# Patient Record
Sex: Female | Born: 1945
Health system: Southern US, Community
[De-identification: ages and names within clinical notes are randomized; demographics above are authoritative.]

## PROBLEM LIST (undated history)

## (undated) DIAGNOSIS — F329 Major depressive disorder, single episode, unspecified: Secondary | ICD-10-CM

## (undated) DIAGNOSIS — F32A Depression, unspecified: Secondary | ICD-10-CM

## (undated) DIAGNOSIS — R300 Dysuria: Secondary | ICD-10-CM

## (undated) DIAGNOSIS — J45909 Unspecified asthma, uncomplicated: Secondary | ICD-10-CM

## (undated) DIAGNOSIS — R32 Unspecified urinary incontinence: Secondary | ICD-10-CM

## (undated) DIAGNOSIS — M25561 Pain in right knee: Secondary | ICD-10-CM

## (undated) DIAGNOSIS — M9711XA Periprosthetic fracture around internal prosthetic right knee joint, initial encounter: Secondary | ICD-10-CM

## (undated) DIAGNOSIS — Z5189 Encounter for other specified aftercare: Secondary | ICD-10-CM

## (undated) DIAGNOSIS — IMO0002 Reserved for concepts with insufficient information to code with codable children: Secondary | ICD-10-CM

## (undated) DIAGNOSIS — M797 Fibromyalgia: Secondary | ICD-10-CM

## (undated) DIAGNOSIS — M1990 Unspecified osteoarthritis, unspecified site: Secondary | ICD-10-CM

## (undated) DIAGNOSIS — R0602 Shortness of breath: Secondary | ICD-10-CM

## (undated) DIAGNOSIS — M25569 Pain in unspecified knee: Secondary | ICD-10-CM

## (undated) DIAGNOSIS — R600 Localized edema: Secondary | ICD-10-CM

## (undated) DIAGNOSIS — Z862 Personal history of diseases of the blood and blood-forming organs and certain disorders involving the immune mechanism: Secondary | ICD-10-CM

## (undated) DIAGNOSIS — E785 Hyperlipidemia, unspecified: Secondary | ICD-10-CM

## (undated) DIAGNOSIS — I471 Supraventricular tachycardia, unspecified: Secondary | ICD-10-CM

## (undated) DIAGNOSIS — I499 Cardiac arrhythmia, unspecified: Secondary | ICD-10-CM

## (undated) DIAGNOSIS — M7062 Trochanteric bursitis, left hip: Secondary | ICD-10-CM

## (undated) DIAGNOSIS — J309 Allergic rhinitis, unspecified: Secondary | ICD-10-CM

## (undated) DIAGNOSIS — Z96659 Presence of unspecified artificial knee joint: Secondary | ICD-10-CM

## (undated) DIAGNOSIS — E039 Hypothyroidism, unspecified: Secondary | ICD-10-CM

## (undated) DIAGNOSIS — K219 Gastro-esophageal reflux disease without esophagitis: Secondary | ICD-10-CM

## (undated) DIAGNOSIS — I10 Essential (primary) hypertension: Secondary | ICD-10-CM

## (undated) DIAGNOSIS — J069 Acute upper respiratory infection, unspecified: Secondary | ICD-10-CM

## (undated) DIAGNOSIS — K5900 Constipation, unspecified: Secondary | ICD-10-CM

## (undated) DIAGNOSIS — E669 Obesity, unspecified: Secondary | ICD-10-CM

## (undated) DIAGNOSIS — IMO0001 Reserved for inherently not codable concepts without codable children: Secondary | ICD-10-CM

## (undated) DIAGNOSIS — Z8679 Personal history of other diseases of the circulatory system: Secondary | ICD-10-CM

## (undated) DIAGNOSIS — Z8709 Personal history of other diseases of the respiratory system: Secondary | ICD-10-CM

## (undated) DIAGNOSIS — N390 Urinary tract infection, site not specified: Secondary | ICD-10-CM

## (undated) DIAGNOSIS — I5189 Other ill-defined heart diseases: Secondary | ICD-10-CM

## (undated) DIAGNOSIS — N329 Bladder disorder, unspecified: Secondary | ICD-10-CM

## (undated) HISTORY — DX: Bladder disorder, unspecified: N32.9

## (undated) HISTORY — DX: Essential (primary) hypertension: I10

## (undated) HISTORY — PX: ACHILLES TENDON REPAIR: SUR1153

## (undated) HISTORY — DX: Presence of unspecified artificial knee joint: Z96.659

## (undated) HISTORY — DX: Reserved for concepts with insufficient information to code with codable children: IMO0002

## (undated) HISTORY — DX: Pain in unspecified knee: M25.569

## (undated) HISTORY — DX: Shortness of breath: R06.02

## (undated) HISTORY — DX: Supraventricular tachycardia, unspecified: I47.10

## (undated) HISTORY — DX: Unspecified osteoarthritis, unspecified site: M19.90

## (undated) HISTORY — DX: Constipation, unspecified: K59.00

## (undated) HISTORY — DX: Pain in right knee: M25.561

## (undated) HISTORY — DX: Supraventricular tachycardia: I47.1

## (undated) HISTORY — DX: Localized edema: R60.0

## (undated) HISTORY — DX: Allergic rhinitis, unspecified: J30.9

## (undated) HISTORY — DX: Obesity, unspecified: E66.9

## (undated) HISTORY — DX: Dysuria: R30.0

## (undated) HISTORY — DX: Unspecified urinary incontinence: R32

## (undated) HISTORY — PX: PLANTAR FASCIA SURGERY: SHX746

## (undated) HISTORY — PX: FINGER SURGERY: SHX640

## (undated) HISTORY — DX: Other ill-defined heart diseases: I51.89

## (undated) HISTORY — PX: FOOT SURGERY: SHX648

## (undated) HISTORY — PX: ESOPHAGOGASTRODUODENOSCOPY: SHX1529

## (undated) HISTORY — PX: CARDIAC ELECTROPHYSIOLOGY STUDY AND ABLATION: SHX1294

## (undated) HISTORY — DX: Periprosthetic fracture around internal prosthetic right knee joint, initial encounter: M97.11XA

## (undated) HISTORY — PX: NECK SURGERY: SHX720

## (undated) HISTORY — DX: Unspecified asthma, uncomplicated: J45.909

## (undated) HISTORY — DX: Hypothyroidism, unspecified: E03.9

## (undated) HISTORY — DX: Trochanteric bursitis, left hip: M70.62

## (undated) HISTORY — DX: Hyperlipidemia, unspecified: E78.5

---
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
## 1975-11-26 HISTORY — PX: ABDOMINAL HYSTERECTOMY: SHX81

## 2002-01-29 ENCOUNTER — Encounter: Payer: Self-pay | Admitting: *Deleted

## 2002-01-29 ENCOUNTER — Ambulatory Visit (HOSPITAL_COMMUNITY): Admission: RE | Admit: 2002-01-29 | Discharge: 2002-01-29 | Payer: Self-pay | Admitting: *Deleted

## 2002-02-12 ENCOUNTER — Ambulatory Visit (HOSPITAL_COMMUNITY): Admission: RE | Admit: 2002-02-12 | Discharge: 2002-02-12 | Payer: Self-pay | Admitting: Neurosurgery

## 2002-02-12 ENCOUNTER — Encounter: Payer: Self-pay | Admitting: Neurosurgery

## 2005-11-11 ENCOUNTER — Inpatient Hospital Stay (HOSPITAL_COMMUNITY): Admission: RE | Admit: 2005-11-11 | Discharge: 2005-11-15 | Payer: Self-pay | Admitting: Orthopedic Surgery

## 2007-03-30 ENCOUNTER — Inpatient Hospital Stay (HOSPITAL_COMMUNITY): Admission: RE | Admit: 2007-03-30 | Discharge: 2007-04-02 | Payer: Self-pay | Admitting: Orthopedic Surgery

## 2007-04-10 ENCOUNTER — Encounter: Admission: RE | Admit: 2007-04-10 | Discharge: 2007-04-10 | Payer: Self-pay | Admitting: Orthopedic Surgery

## 2009-01-26 ENCOUNTER — Inpatient Hospital Stay (HOSPITAL_COMMUNITY): Admission: RE | Admit: 2009-01-26 | Discharge: 2009-01-29 | Payer: Self-pay | Admitting: Orthopedic Surgery

## 2009-10-12 ENCOUNTER — Inpatient Hospital Stay (HOSPITAL_COMMUNITY): Admission: RE | Admit: 2009-10-12 | Discharge: 2009-10-15 | Payer: Self-pay | Admitting: Orthopedic Surgery

## 2011-02-27 LAB — DIFFERENTIAL
Eosinophils Absolute: 0.2 10*3/uL (ref 0.0–0.7)
Lymphs Abs: 1.7 10*3/uL (ref 0.7–4.0)
Monocytes Absolute: 0.4 10*3/uL (ref 0.1–1.0)
Monocytes Relative: 8 % (ref 3–12)
Neutro Abs: 3.2 10*3/uL (ref 1.7–7.7)
Neutrophils Relative %: 59 % (ref 43–77)

## 2011-02-27 LAB — BASIC METABOLIC PANEL
CO2: 29 mEq/L (ref 19–32)
CO2: 29 mEq/L (ref 19–32)
Calcium: 7.8 mg/dL — ABNORMAL LOW (ref 8.4–10.5)
Calcium: 8.1 mg/dL — ABNORMAL LOW (ref 8.4–10.5)
Creatinine, Ser: 0.59 mg/dL (ref 0.4–1.2)
Creatinine, Ser: 0.74 mg/dL (ref 0.4–1.2)
GFR calc Af Amer: >60 mL/min (ref >60)
Glucose, Bld: 118 mg/dL — ABNORMAL HIGH (ref 70–99)
Sodium: 137 mEq/L (ref 135–145)

## 2011-02-27 LAB — CBC
Hemoglobin: 13 g/dL (ref 12.0–15.0)
Hemoglobin: 7.4 g/dL — ABNORMAL LOW (ref 12.0–15.0)
MCHC: 33.9 g/dL (ref 30.0–36.0)
MCHC: 34.4 g/dL (ref 30.0–36.0)
MCHC: 34.4 g/dL (ref 30.0–36.0)
MCV: 92.3 fL (ref 78.0–100.0)
Platelets: 100 10*3/uL — ABNORMAL LOW (ref 150–400)
Platelets: 193 10*3/uL (ref 150–400)
RBC: 2.42 MIL/uL — ABNORMAL LOW (ref 3.87–5.11)
RBC: 2.58 MIL/uL — ABNORMAL LOW (ref 3.87–5.11)
RDW: 12.2 % (ref 11.5–15.5)
RDW: 12.6 % (ref 11.5–15.5)
RDW: 13.2 % (ref 11.5–15.5)
WBC: 6.6 10*3/uL (ref 4.0–10.5)

## 2011-02-27 LAB — CROSSMATCH: Antibody Screen: NEGATIVE

## 2011-02-27 LAB — PROTIME-INR: INR: 0.96 (ref 0.00–1.49)

## 2011-02-27 LAB — COMPREHENSIVE METABOLIC PANEL
ALT: 22 U/L (ref 0–35)
Albumin: 3.7 g/dL (ref 3.5–5.2)
Calcium: 9.9 mg/dL (ref 8.4–10.5)
Glucose, Bld: 74 mg/dL (ref 70–99)
Sodium: 140 mEq/L (ref 135–145)
Total Protein: 6.8 g/dL (ref 6.0–8.3)

## 2011-02-27 LAB — URINALYSIS, ROUTINE W REFLEX MICROSCOPIC
Ketones, ur: NEGATIVE mg/dL
Nitrite: NEGATIVE
Protein, ur: NEGATIVE mg/dL
Urobilinogen, UA: 0.2 mg/dL (ref 0.0–1.0)

## 2011-02-27 LAB — APTT: aPTT: 27 seconds (ref 24–37)

## 2011-03-07 LAB — URINALYSIS, ROUTINE W REFLEX MICROSCOPIC
Bilirubin Urine: NEGATIVE
Ketones, ur: NEGATIVE mg/dL
Nitrite: NEGATIVE
Protein, ur: NEGATIVE mg/dL
Urobilinogen, UA: 0.2 mg/dL (ref 0.0–1.0)

## 2011-03-07 LAB — BASIC METABOLIC PANEL
BUN: 20 mg/dL (ref 6–23)
CO2: 26 mEq/L (ref 19–32)
CO2: 27 mEq/L (ref 19–32)
Calcium: 7.8 mg/dL — ABNORMAL LOW (ref 8.4–10.5)
Calcium: 7.9 mg/dL — ABNORMAL LOW (ref 8.4–10.5)
Chloride: 104 mEq/L (ref 96–112)
Creatinine, Ser: 1.21 mg/dL — ABNORMAL HIGH (ref 0.4–1.2)
Creatinine, Ser: 1.67 mg/dL — ABNORMAL HIGH (ref 0.4–1.2)
GFR calc Af Amer: 55 mL/min — ABNORMAL LOW (ref >60)
GFR calc Af Amer: >60 mL/min (ref >60)
GFR calc non Af Amer: 31 mL/min — ABNORMAL LOW (ref >60)
GFR calc non Af Amer: 45 mL/min — ABNORMAL LOW (ref >60)
Glucose, Bld: 116 mg/dL — ABNORMAL HIGH (ref 70–99)
Sodium: 134 mEq/L — ABNORMAL LOW (ref 135–145)

## 2011-03-07 LAB — CBC
HCT: 25.6 % — ABNORMAL LOW (ref 36.0–46.0)
HCT: 40.6 % (ref 36.0–46.0)
Hemoglobin: 13.9 g/dL (ref 12.0–15.0)
MCHC: 34.1 g/dL (ref 30.0–36.0)
MCV: 93.2 fL (ref 78.0–100.0)
Platelets: 138 10*3/uL — ABNORMAL LOW (ref 150–400)
RBC: 2.84 MIL/uL — ABNORMAL LOW (ref 3.87–5.11)
RBC: 2.99 MIL/uL — ABNORMAL LOW (ref 3.87–5.11)
RBC: 4.35 MIL/uL (ref 3.87–5.11)
RDW: 12.8 % (ref 11.5–15.5)
RDW: 13.3 % (ref 11.5–15.5)
WBC: 6.7 10*3/uL (ref 4.0–10.5)
WBC: 7.7 10*3/uL (ref 4.0–10.5)
WBC: 8.3 10*3/uL (ref 4.0–10.5)

## 2011-03-07 LAB — CROSSMATCH
ABO/RH(D): O NEG
ABO/RH(D): O NEG
Antibody Screen: NEGATIVE

## 2011-03-07 LAB — COMPREHENSIVE METABOLIC PANEL
Alkaline Phosphatase: 84 U/L (ref 39–117)
BUN: 14 mg/dL (ref 6–23)
CO2: 32 mEq/L (ref 19–32)
Chloride: 104 mEq/L (ref 96–112)
Creatinine, Ser: 0.68 mg/dL (ref 0.4–1.2)
GFR calc non Af Amer: >60 mL/min (ref >60)
Glucose, Bld: 79 mg/dL (ref 70–99)
Total Bilirubin: 0.9 mg/dL (ref 0.3–1.2)

## 2011-03-07 LAB — DIFFERENTIAL
Basophils Absolute: 0 10*3/uL (ref 0.0–0.1)
Basophils Relative: 0 % (ref 0–1)
Lymphocytes Relative: 24 % (ref 12–46)
Neutro Abs: 5.6 10*3/uL (ref 1.7–7.7)

## 2011-03-07 LAB — PROTIME-INR: Prothrombin Time: 12.8 seconds (ref 11.6–15.2)

## 2011-03-07 LAB — APTT: aPTT: 29 seconds (ref 24–37)

## 2011-04-09 NOTE — Op Note (Signed)
Peggy Jennings, Peggy Jennings              ACCOUNT NO.:  [id number]   MEDICAL RECORD NO.:  [id number]          PATIENT TYPE:  INP   LOCATION:  0002                         FACILITY:  [facility]   PHYSICIAN:  John L. Rendall, M.D.  DATE OF BIRTH:  [birth year]   DATE OF PROCEDURE:  01/26/2009  DATE OF DISCHARGE:                               OPERATIVE REPORT   PREOPERATIVE DIAGNOSES:  Osteoarthritis, left hip surgery and morbid  obesity.   SURGICAL PROCEDURES:  Left AML total hip replacement.   POSTOPERATIVE DIAGNOSIS:  Osteoarthritis, left hip surgery and morbid  obesity.   SURGEON:  John L. Rendall, M.D.   ASSISTANTAlisa Graff PAC   ANESTHESIA:  General.   PATHOLOGY:  The patient weighs 246 pounds and already has 2 total knees.  She has now got unending miserable pain with incapacitation from a worn  out left hip.  There is bone on bone on the regular films.   PROCEDURE:  Under general anesthesia the patient is placed in the right  lateral decubitus position and hip and the upper 2/3 of the leg are  prepared with DuraPrep and draped as a sterile field.  Posterior  approach is made through skin and 5 inches of subcutaneous tissue to the  IT band which is split in the line of its fibers.  A deep Charnley  retractor is inserted.  The hip was then internally rotated and the  short external rotators and hip capsule were taken down from bone with  the electrocautery.  Multiple small vessels were cauterized.  The hip  capsule was opened in a T-shaped manner, peeling the short external  rotators off of it with a long Cobb.  Once this was done, the hip was  dislocated and the superior femoral neck is exposed.  The IM initiator  and canal finder are used.  Progressive reaming up to 13 mm is done for  a 13.5 femoral component.  Femoral neck is then osteotomized about 1.5  cm above the lesser trochanter.  Rasping is then done using the 10.5, 12  and 13.5 narrow.  A calcar reamer was used on the 12.   Once this side is  completed, attention was turned to the acetabulum.  Two cobras were  placed inferiorly and 2 wing retractors superiorly in the interval  between the labrum and the capsule.  The labrum was then excised and the  ligamentum teres.  The hip was fully exposed and reaming then began with  45, 47, 49, 51, 52, 53 and the decision is made for a 54 acetabulum.  Trial seating of a 52 reveals a sort of asymmetrical rim fit and the  decision was made to go for the 300 series acetabular component.  The  Pinnacle acetabular cup size 54 was then press fit.  Trial seating with  the acetabular liner plus 4,10 degrees, 36 mm inside diameter was then  trialed and on the femoral side, a 13.5 narrow rasp with the standard  neck and +1.5 hip ball.  With this in place, the hip was stable through  full normal range of motion.  The apex hole eliminator and the Pinnacle  Marathon liner were then inserted, +4,10 degree angle, 36-mm inside and  54 mm outside diameter.  The acetabular side is then addressed in the  AML small stature 155 mm length fully pour coated with a 43 mm offset  was then used for a size 13.5, the metal femoral head 36 mm +1-1/2 neck  length is inserted with all these components in place, the hip was  reduced.  It is stable through full normal range of motion.  The hip  capsule was closed with #1 Tycron.  Short external rotator reattached  loosely with #1  Tycron, IT band closed with #1 Tycron, #1 Vicryl was then used in the  deep and more superficial layer and 2-0 Vicryl subcutaneous.  Skin  staples were used.  Blood loss estimate 350 mL.  The patient tolerated  the procedure well and returned to recovery in good condition.  Operative time approximately an hour and 25 minutes.      John L. Rendall, M.D.  Electronically Signed     JLR/MEDQ  D:  01/26/2009  T:  01/26/2009  Job:  [id number]

## 2011-04-09 NOTE — Op Note (Signed)
[name], Peggy Jennings NO.:  [id number]   MEDICAL RECORD NO.:  [id number]          PATIENT TYPE:  INP   LOCATION:  2899                         FACILITY:  [facility]   PHYSICIAN:  John L. Rendall, M.D.  DATE OF BIRTH:  [birth year]   DATE OF PROCEDURE:  03/30/2007  DATE OF DISCHARGE:                               OPERATIVE REPORT   PREOPERATIVE DIAGNOSIS:  Osteoarthritis right knee.   SURGICAL PROCEDURES:  Right LCS total knee replacement with computer  navigation assistance.   POSTOPERATIVE DIAGNOSIS:  Osteoarthritis right knee.   SURGEON:  John L. Rendall, M.D.   ASSISTANT:  Rexene Edison.   ANESTHESIA:  General.   PATHOLOGY:  The patient has bone on bone medially, right knee with  chronic pain despite all conservative measures and the arthroscopic  debridement survey years back.  She is eager for knee replacement due to  the chronic pain.   PROCEDURE:  Under general anesthesia, the right leg was prepared with  DuraPrep and draped as a sterile field.  Sterile tourniquet is put on  the proximal leg and legs wrapped out with the Esmarch tourniquet  elevated 350 mm.  A midline incision was made.  The patella was everted.  The knee is debrided for computer mapping.  Schanz pins were placed  through separate stab wounds in the superior medial tibia and superior  Schanz pins were placed in the distal medial femur through this surgical  incision.  The arrays are set up, the femoral head was identified medial  and lateral malleoli are identified in the proximal tibia and distal  femur are mapped.  Once this was completed, the proximal tibia is  resected using the first navigation guide.  This was done within 1  degree of anatomic accuracy.  Attention was then turned to the balancing  and ligaments were balanced within 1.2 degrees of anatomic accuracy.  Attention was then turned to the femur and the anterior and posterior  flare of the distal femur are resected with the  computer nav.  Distal  femoral cut was then made and cut superior balanced at approximately  12.5 mm.  The recessing guide is then used.  Remnants of the menisci and  cruciates and spurs off the back of the femoral condyle were debrided  proximal tibia was then exposed.  Center peg hole with keel is inserted  and trial 12.5 bearing is used and a standard plus femoral trial.  With  all components in place the knee has virtual anatomic alignment 0  degrees variation from anatomical axis and excellent stability of the  ligamentous structures to varus valgus and drawer testing.  Patella was  osteotomized, three peg holes placed with alignment, good with all trial  components.  The Schanz pins and arrays were removed.  Permanent  components were obtained.  A synovectomy is done while the cement is  being extra and the knee is washed out with pulse irrigation.  At this  point the permanent components  were cemented in place while cement hardens.  Minor debridement is  carried out.  Tourniquet is let down  at 1 hour 6 minutes.  Minor  bleeding is controlled with electrocautery.  The knee is then closed in  layers over medium Hemovac drain with #1 Tycron, #1-0 and #2-0 Vicryl  and skin clips.  The patient returned to recovery in good condition.      John L. [name], M.D.     Renato Gails  D:  03/30/2007  T:  03/30/2007  Job:  [id number]

## 2011-04-12 NOTE — Op Note (Signed)
[name], Jennings              ACCOUNT NO.:  [id number]   MEDICAL RECORD NO.:  [id number]          PATIENT TYPE:  INP   LOCATION:  1506                         [facility]ACILITY:  [facility]   PHYSICIAN:  John L. Rendall, M.D.  DATE OF BIRTH:  06/09/1946   DATE OF PROCEDURE:  11/11/2005  DATE OF DISCHARGE:                                 OPERATIVE REPORT   PREOPERATIVE DIAGNOSIS:  Osteoarthritis left knee.   SURGICAL PROCEDURES:  Left LCS total knee replacement with computer  navigation assistance.   POSTOPERATIVE DIAGNOSIS:  Osteoarthritis left knee.   SURGEON:  Dr. [name]   ASSISTANT:  Duffy, PAC   ANESTHESIA:  General with femoral nerve block.   PATHOLOGY:  The patient has a 5-degree varus knee with 4-5 degree extension  lag and bone against bone in the medial compartment with spurs.  She has had  unremitting pain resistant to conservative measures and a previous  arthroscopic debridement.   PROCEDURE:  Under general anesthesia, the left leg is prepared with DuraPrep  and draped as a sterile field.  It is wrapped out with an Esmarch, and a  sterile tourniquet is elevated at 350 mm.  Midline incision is made.  The  patella is everted.  The femur is sized to the standard.  The knee is  debrided in preparation for computer mapping.  Schanz pins are placed in the  proximal medial tibia through extra puncture wounds and due to the thickness  of the thigh which is right at 27 inches mid thigh, it is necessary to put  the femoral pins in through stab wounds as well.  At this point, computer  mapping is done, finding first the femoral head, then the ankle and then the  proximal tibia and distal femur.  Once this is completed and checked for  accuracy, proximal tibial resection is carried out within 1.5 degrees of  anatomical axis.  The tensioner is then used, and the leg is tensioned  within 1 degree of anatomical axis, and the flexion and extension gaps are  measured.  Subsequently,  balancing of the flexion and extension gaps is done  on computer to give the most stable knee with a standard femoral component.  Twelve-five bearing is estimated to be the best fit.  Using the first  femoral guide, the anterior and posterior flare of the distal femur are  resected.  Using the second guide, the distal femoral cut is made; flexion  and extension gaps are checked with the spacer block, and it is found that  there is some slight hyperextension.  The recessing guide is then used.  At  this point, the proximal tibia is exposed.  Debridement remnants of the  cruciates and menisci is done.  Spurs are taken off the back with femoral  condyles.  Tibia is sized at a #3.  Center peg hole with keel is inserted.  Trial reduction of the 12.5 bearing and standard femur reveals good flexion  and extension but slight hyperextension.  Patella is then osteotomized and 3  peg holes placed.  The knee is then retested using a  15-mm bearing.  Alignment is felt to be excellent within 1.5 degrees of anatomical axis, and  the flexion deformity preop is corrected at about zero.  Schanz pins are  then removed.  Bony surfaces are prepared with pulse irrigation.  Full  synovectomy is completed.  Permanent components are then cemented in place,  tibia first, rotating bearing, then femoral component.  It should be noted  the size of her thigh, 31 inches under the cuff, made it difficult applying  the femoral component, but an excellent fit was obtained.  The patellar  button was then attached.  Once cement hardened, the tourniquet was let down  at 1 hour and 30 minutes;  excess cement was removed.  Multiple small vessels were cauterized.  The  knee was closed in layers of #1 Tycron, #1 Vicryl, 2-0 Vicryl, and skin  clips.  Total operative time just under 2 hours.  The patient tolerated the  procedure well and returned to recovery in good condition.      John L. Rendall, M.D.  Electronically  Signed     JLR/MEDQ  D:  11/11/2005  T:  11/12/2005  Job:  [id number]

## 2011-04-12 NOTE — Discharge Summary (Signed)
Peggy Jennings, Peggy Jennings              ACCOUNT NO.:  [id number]   MEDICAL RECORD NO.:  [id number]          PATIENT TYPE:  INP   LOCATION:  1506                         FACILITY:  [facility]   PHYSICIAN:  Peggy Jennings, M.D.  DATE OF BIRTH:  [birth year]   DATE OF ADMISSION:  11/11/2005  DATE OF DISCHARGE:  11/15/2005                                 DISCHARGE SUMMARY   ADMISSION DIAGNOSES:  1.  End-stage osteoarthritis, left knee, medial compartment.  2.  Hypertension.  3.  Gastroesophageal reflux disease.  4.  Asthma.  5.  Fibromyalgia.  6.  Hyperglycemia.  7.  Depression.   DISCHARGE DIAGNOSES:  1.  Status post left total knee arthroplasty.  2.  Acute blood loss anemia secondary to surgery, requiring packed red blood      cell transfusion.  3.  Confusion secondary to pain medications.  4.  Pyrexia, resolved.  5.  Erythema about the left knee incision, placed on Keflex.   HISTORY OF PRESENT ILLNESS:  Peggy Jennings is a 65 year old white female with a  2-3 year history of gradual onset of progressively worsening left knee pain.  The patient has a history of right knee arthroscopy in 2004 and a left knee  arthroscopy in April, 2006 by Dr. Priscille Jennings.  The patient has had no other  injuries or surgeries to her knee.  The left knee pain is described as  intermittent, a dull-to-sharp sensation and is diffuse about the joint with  radiation proximally to the thigh and hip.  She has increased pain with any  standing or walking.  The pain is exacerbated by going up or down stairs.  The pain decreases with rest and only with changing positions.  The patient  does describe mechanical symptoms of catching.  No locking or giving way.  The knee feels unstable.  The patient has some balance issues with the knee.  She does have waking pain.  She uses no assistive devices to ambulate.  The  patient has failed conservative treatment, which included cortisone  injections, which provided minimal relief.  No  viscus supplementation.  The  patient takes Tramadol or Mobic for pain, and this provides some relief.   ALLERGIES:  No known drug allergies.   MEDICATIONS:  1.  Micardis/HCTZ 80/25 mg 1 daily in the a.m.  2.  Mobic 7.5 mg 1 tablet b.i.d.  3.  Vytorin 10/20 mg 1 tablet nightly.  4.  Diltiazem ER 180 mg 1 tab nightly.  5.  Cymbalta 60 mg 1 tab in the a.m.  6.  Altace 10 mg 1 tab b.i.d.  7.  Tramadol 1 tab b.i.d.  8.  Nexium 1 tab nightly.  9.  Aspirin 81 mg 1 tab q.a.m.  10. Albuterol inhaler 2 puffs inhaled q.4h. p.r.n.  11. Senior vitamin 1 tab p.o. q.a.m.  12. Super Omega 3 two tabs p.o. q.a.m.  13. Glucosamine chondroitin 2 tabs q.a.m.  14. Calcium 2 tablets p.o. q.a.m.  15. Essian-HS 1.25/0.625 1 q.p.m.   SURGICAL PROCEDURE:  The patient was taken to the operating room on November 11, 2005 by  Dr. Erasmo Jennings, assisted by Peggy Rancher, PA-C.  The patient  was placed under general anesthesia and received a supplemental femoral  nerve block.  The patient then underwent a left LCS total knee with computer  navigation.  The following components were used.  A standard femoral  component, size 3 tibia tray, a 15 mm bearing, and a standard three peg  metal-back patellar component.  All components were cemented.  The patient  tolerated the procedure well and returned to recovery in good stable  condition.   CONSULTS:  The following consults were obtained while the patient was  hospitalized:  PT/OT, case management.   HOSPITAL COURSE:  On postop day #1, the patient with no chest pain,  shortness of breath, calf pain.  Tolerating diet.  Pain under good control.  Patient afebrile.  Vital signs stable.   On postop day #2, the patient with some dizziness with standing.  No  shortness of breath.  No chest pain.  No nausea or vomiting.  Tolerating  diet well.  Pain under adequate control.  T max is 100.6.  Vital signs  otherwise stable.  H&H is 9.8 and 27.9.  White count was 10,300.   Dizziness  was felt to be secondary to pain medications.  PCA was DC'd.  The patient  was monitored.   On postop day #3, the patient denies chest pain, shortness of breath, nausea  or vomiting.  No dysuria.  Tolerating diet well.  Pain under adequate  control.  Confusion overnight, per patient.  The patient did report a  similar confusion as an outpatient with pain medicines in the past.  The  patient's T max is 102.  Blood pressure was 117/56.  Pulse 96.  O2  saturations 91% on room air.  H&H is 9.1 and 26.4.   Due to the patient's acute blood loss anemia secondary to surgery, decreased  O2 saturation on room air, and dizziness, she was transfused 1 units of  packed red blood cells.  With regards to the patient's confusion, dizziness,  it was felt that this was due to pain meds and/or acute blood loss anemia.  The patient's pain medicine was changed from Percocet to Vicodin.  The  patient with pyrexia.  Decreased O2 saturation; therefore, chest x-ray was  checked.  Also, blood culture.  The patient denied any dysuria.  Aggressive  pulmonary toilet is instituted.   On physical exam, patient with calf pain, positive Homans'.  Therefore,  Doppler of the left lower extremity was obtained to rule out DVT.   On postoperative day #4, the patient overall filling much better.  No chest  pain.  No shortness of breath.  No nausea or vomiting.  Dizziness resolved.  Positive bowel movement.  Pain under adequate control.  T max is 100.8.  Blood pressure was 138/68, pulse 91, respiratory rate 20.  O2 saturation 96%  on room air.  Lungs were clear to auscultation versus the day before where  there were crackles at the lower bases and expiratory wheezing.  Doppler was  negative for DVT.  Blood cultures pending.  Left calf is nontender.  There  was minimal serous drainage from the left knee incision with some mild erythema about the staples.  Therefore, the patient was placed on Keflex 500  mg 1 p.o.  q.i.d. x7 days for empiric treatment.  An H&H was performed, and  hemoglobin was 9.  Hematocrit was 26.  The patient was to be discharged  later  that day to home if she remained afebrile and continued to do well  with physical therapy.   LABS:  Routine labs on admission:  CBC dated November 06, 2005:  All values  within normal limits.  Coags on admission, November 06, 2005:  All values  within normal limits.  Routine chemistries on admission, November 06, 2005:  All values  within normal limits.  Hepatic enzymes on admission, all values  were within normal limits.  Urinalysis on admission was negative.  Blood  cultures x2 were negative for growth x5 days.   DISCHARGE INSTRUCTIONS:  1.  The patient was to resume preop meds except for Mobic, tramadol, and      aspirin.  The patient was to resume aspirin on November 18, 2005 after      finishing her extra dosing.  The following meds were to be added:      Arixtra 2.5 mg subcutaneously at 8 p.m., the last dose November 17, 2005, Keflex 500 mg 1 tab 4 times daily x7 days, Vicodin 5/500 1-2      tablets every 4-6 hours, Robaxin 500 mg 1 tablet every 6 hours as needed      for spasm.  Iron supplement OTC 1 tab daily x28 days.  2.  Diet:  No restrictions.  3.  Activity:  Patient's weightbearing as tolerated on the left leg.  4.  Wound care:  Patient is to keep the wound clean and dry, change the      dressing daily.  May shower after two days if no drainage.  The patient      is to call the office if any signs of infection, temperature greater      than 101.5, or the pain is not adequately controlled.  5.  Home health/PT, per Genevieve Norlander.  6.  Special instructions:  CPM 0-90 degrees 6-8 hours a day.  Increase by 10      degrees daily.  7.  Followup:  The patient needs followup with Dr. Priscille Jennings in the office in      approximately 10 days from discharge.  The patient is to call the office      at [phone] for appointment.      Richardean Canal, P.A.      Peggy Jennings, M.D.  Electronically Signed    GC/MEDQ  D:  12/25/2005  T:  12/25/2005  Job:  [id number]

## 2011-04-12 NOTE — Discharge Summary (Signed)
Peggy Jennings, Peggy Jennings              ACCOUNT NO.:  [id number]   MEDICAL RECORD NO.:  [id number]          PATIENT TYPE:  INP   LOCATION:  1604                         FACILITY:  [facility]   PHYSICIAN:  John L. Rendall, M.D.  DATE OF BIRTH:  [birth year]   DATE OF ADMISSION:  01/26/2009  DATE OF DISCHARGE:  01/29/2009                               DISCHARGE SUMMARY   ADMISSION DIAGNOSES:  1. End-stage osteoarthritis left hip status post bilateral knee      replacements.  2. Hypertension.  3. History of supraventricular tachycardia treated with ablation.  4. Hypercholesterolemia.  5. Depression.  6. Fibromyalgia.  7. Irritable bowel syndrome.  8. Asthma.  9. History of gastroesophageal reflux disease.   DISCHARGE DIAGNOSES:  1. End-stage osteoarthritis left hip status post left total hip      arthroplasty.  2. Acute blood loss anemia secondary to surgery.  3. Low urinary output, now improved.  4. History of bilateral total knees.  5. Hypertension.  6. History of supraventricular tachycardia treated with ablation.  7. Hypercholesterolemia.  8. Fibromyalgia.  9. Depression.  10.Irritable bowel syndrome.  11.Asthma.  12.History of gastroesophageal reflux disease.   SURGICAL PROCEDURES:  On January 26, 2009, Peggy Jennings underwent a left  total hip arthroplasty by Dr. Jonny Ruiz L.  Rendall assisted by Peggy Morale  PA-C.  She had a Pinnacle 300 series acetabular cup size 54 mm placed  with Apex hole eliminator and a Pinnacle marathon acetabular liner plus  410 degree 36 mm inner diameter and 54 mm outer diameter.  An AML small  stature size 13.5 femoral stem 155 mm length, 43 mm offset with a metal-  on-metal femoral head +1.5 neck length, size 36 with a 12/14 taper.   COMPLICATIONS:  None.   CONSULTS:  Physical therapy consult and occupational therapy consult  January 27, 2009.   HISTORY OF PRESENT ILLNESS:  A 65 year old white female patient  presented to Peggy Jennings with history of bilateral  total knees and 6  months of gradual onset of progressive left hip pain.  Pain is now  constant ache to burn and stab in left groin, thigh and buttock with  radiation into the proximal tibia.  It increases with weightbearing in  bad weather and decreases with sleep and rest.  The hip pops, catches,  grinds, keeps her up at night.  She cannot sleep on that left side.  She  has failed conservative treatment and x-rays show end-stage arthritic  changes of the hip.  Because of this, she is presenting for a left hip  replacement.   HOSPITAL COURSE:  Peggy Jennings tolerated her surgical procedure well  without immediate postoperative complications.  She was transferred to  the orthopedic floor.  On postop day 1, she was afebrile, BP was low at  97/62.  Hemoglobin was 8.7 with hematocrit of 25.6.  Leg was  neurovascularly intact.  Dressing intact with minimal drainage.  Because  of her low blood pressure, low urine output and low hemoglobin, she was  transfused with 2 units of packed red blood cells and some BP meds were  held.  I's and Os were monitored.  She was started on therapy per  protocol.   On postop day 2, she did still have some dizziness when out of bed.  Hemoglobin had improved to 9.5 with hematocrit of 27.9, BP was still low  at 97/56, but her leg remained neurovascularly intact.  She was  continued on therapy that day.   Postop day 3, she was feeling much better, less dizziness.  Hemoglobin  was 9, hematocrit 26, BP improved to 116/65.  Incision on the hip was  well approximated with staples.  No erythema or ecchymosis.  It was felt  she was ready for discharge home and was discharged home later that day.   DISCHARGE INSTRUCTIONS:  Diet:  She is to resume her regular  prehospitalization diet.   MEDICATIONS:  She may resume her home meds as follows:  1. Cymbalta 60 mg p.o. q.h.s.  2. Mobic 75 mg twice a day.  She is to hold at this time.  3. Simvastatin 40 mg p.o. q.h.s.  4.  Ramipril 10 mg p.o. b.i.d.  5. Premarin 0.45 mg p.o. q.a.m.  6. Diltiazem 240 mg p.o. q.h.s.  7. Micardis 80 mg p.o. q.a.m.  8. Aspirin, she is to hold at this time while on the Arixtra.  9. Tramadol, she is to hold at this time.  10.Robaxin 500 mg 1 p.o. q.i.d. p.r.n. spasm.  11.Fish oil 1000 mg p.o. b.i.d.  12.Chondroitin and glucosamine twice a day p.o.  13.Multivitamin 1 tablet p.o. q.a.m.   ADDITIONAL MEDICATIONS AT THIS TIME:  1. Arixtra 2.5 mg subcutaneous q.8 p.m. with the last dose on February 04, 2009.  2. On February 05, 2009, she is to resume daily aspirin.  3. Celebrex 200 mg 1 tablet p.o. b.i.d. for 1 week and then 1 tablet      p.o. day, 30 with no refill.  4. Norco 5/325 one-two tablets p.o. q.4 h. p.r.n. for pain, 60 with no      refill.  5. Robaxin 500 mg 1-2 tablets p.o. q.6 h. p.r.n. for spasms, 60 with      no refill.   ACTIVITY:  She can be out of bed weightbearing as tolerated on the left  leg with use of the walker.  No lifting or driving for 6 weeks.  Home  health PT per [facility].  Please see the blue total hip  discharge sheet for further activity instructions.   WOUND CARE:  She may shower after no drainage from the wound for 2 days.  Please see the blue total hip discharge sheet for further wound care  instructions.   FOLLOW UP:  She needs to follow up with Peggy Jennings in our office on  Tuesday, February 07, 2009 and needs to call [phone] for that appointment.   LABORATORY DATA:  Hemoglobin and hematocrit ranged from 13.9 and 40.6 on  January 23, 2009 to 8.7 at 25.6 on January 27, 2009 to 9 and 26 on January 29, 2009.  Platelets went from 197 on January 23, 2009 to 112 on January 28, 2009 to 124 on January 29, 2009.  Sodium ranged from 142 on January 23, 2009  to 131 on January 28, 2009 to 134 on January 29, 2009.  Glucose ranged from  79 on January 23, 2009 to 119 on January 29, 2009.  BUN and creatinine went  from 14 and 0.68 on January 23, 2009 to 26  and  1.21 on January 28, 2009 to  15 and 0.82 on January 29, 2009.  Estimated GFR was greater than 60 on  January 23, 2009 and went to 31 on January 27, 2009, 45 on January 28, 2009  and greater than 60 on January 29, 2009.  All other laboratory studies  were within normal limits.   DIAGNOSTICS:  Chest x-ray done on January 23, 2009 showed no acute  abnormalities.      Legrand Pitts Duffy, P.A.      John L. Rendall, M.D.  Electronically Signed    KED/MEDQ  D:  02/16/2009  T:  02/16/2009  Job:  [id number]

## 2011-04-12 NOTE — Discharge Summary (Signed)
Peggy Jennings, LUNT NO.:  [id number]   MEDICAL RECORD NO.:  [id number]          PATIENT TYPE:  INP   LOCATION:  5028                         FACILITY:  [facility]   PHYSICIAN:  John L. Rendall, M.D.  DATE OF BIRTH:  1946/01/10   DATE OF ADMISSION:  03/30/2007  DATE OF DISCHARGE:  04/02/2007                               DISCHARGE SUMMARY   ADMISSION DIAGNOSES:  1. End-stage osteoarthritis right knee.  2. Hypertension.  3. Asthma.  4. Fibromyalgia.   DISCHARGE DIAGNOSES:  1. End-stage osteoarthritis right knee status post right total knee      arthroplasty.  2. Acute blood loss anemia secondary to surgery.  3. Thrombocytopenia now stable.  4. Constipation now resolved.  5. Hypertension.  6. Asthma.  7. Fibromyalgia.   SURGICAL PROCEDURES:  On Mar 30, 2007 Peggy Jennings underwent a right total  knee arthroplasty with computer navigation by Dr. Jonny Ruiz L.  Rendall  assisted by Rexene Edison PA-C.  She had an LCS complete standard plus metal  back patella placed with an LCS complete RP insert size standard plus,  12.5 mm thickness.  A DePuy NBT keel tibial tray cemented size 2.5 and  LCS complete primary femoral component cemented size standard plus  right.   COMPLICATIONS:  None.   CONSULTS:  1. Physical therapy consult Mar 31, 2007.  2. Occupational therapy consult and case management consult Apr 01, 2007.   HISTORY OF PRESENT ILLNESS:  This 65 year old white female patient  presented to Dr. [name] with a history of a left knee replacement doing  well.  She has had persistent pain since that time in the right knee at  rest and with ADLs.  The pain is constant and severe.  She has failed  conservative treatment, and x-rays show end-stage arthritic changes of  the knee.  Because of this she is presenting for a right knee  replacement.   HOSPITAL COURSE:  Peggy Jennings tolerated her surgical procedure well  without immediate postoperative complications.  She was  transferred to  5000.  Postop day #1 she was afebrile, vitals stable.  Leg was  neurovascularly intact.  Hemoglobin 10, hematocrit 29.  Vitals stable.  She was started on therapy per protocol.   Postop day #2 T-max 98.8, vitals stable.  Hemoglobin 9.2, hematocrit  26.4, platelets low at 146.  She was switched to p.o. pain meds.  Foley  was DC'd.  Continued on therapy.  Constipation was treated with a  laxative.   On postop day #3 she is feeling better.  Hemoglobin 8.3 but she is  asymptomatic.  The hemoglobin was rechecked later that day, and it was  8.  She was subsequently transfused with 1 unit of packed red blood  cells and was able to be DC'd home afterwards.   DIET:  She can resume her regular prehospitalization diet.   MEDICATIONS:  She may resume her prehospitalization meds as noted.  1. Diltiazem 180 mg one tablet p.o. q.p.m.  2. Altace 10 mg one tablet p.o. b.i.d.  3. Cymbalta 60 mg one tablet  p.o. q.a.m.  4. Nexium 40 mg one tablet p.o. q.p.m.  5. __________ 0.625 mg one tablet p.o. q.p.m.  6. Micardis 80 mg one tablet p.o. q.a.m.  7. Vytorin 10/20 mg one tablet p.o. q.p.m.  8. Albuterol inhaler 2 puffs inhaled q.4 h. p.r.n.  9. She is not to resume her Mobic and tramadol at this time.   ADDITIONAL MEDICATIONS AT THIS POINT:  1. Arixtra 2.5 mg subcu q. 8 p.m.  Last dose Apr 05, 2007 and on the      12th she may resume one baby aspirin a day.  2. Norco 5/325 mg 1-2 tablets p.o. q.4 h. p.r.n. for pain, #60 with no      refill.  3. Robaxin 500 mg 1-2 tablets p.o. q.6 h. p.r.n. for spasms.  4. Iron supplement one tablet twice a day for one month.   ACTIVITY:  She can be out of bed weightbearing as tolerated on the right  leg with use of the walker.  She is to have home CPM 0-100 degrees 6-8  hours a day and home health PT per [place].  Please see the blue total  knee discharge sheet for further activity instructions.   WOUND CARE:  She may shower after no drainage from  the wound for two  days.  Please see the blue total knee discharge sheet for further wound  care instructions.   FOLLOWUP:  She is to follow up with Dr. [name] in our office on  Tuesday, Apr 14, 2007, and needs to call [phone] for that appointment.   LABORATORY DATA:  Hemoglobin/hematocrit ranged from 13.5 and 38.3 on the  2nd to a low of 8 and 22.7 on the 8th.  Platelets ranged from 193 on the  2nd to 146 on the 7th.   Sodium dropped to a low of 134 on the 7th.  Potassium to a high of 5.2  on the 7th, but then rebounded to 4.6 on the 8th.  Glucose ranged from  71 on the 2nd to a high of 108 on the 7th.  All other laboratory studies  were within normal limits.     Legrand Pitts Duffy, P.A.      John L. Rendall, M.D.  Electronically Signed   KED/MEDQ  D:  05/15/2007  T:  05/15/2007  Job:  [id number]

## 2011-04-12 NOTE — H&P (Signed)
Peggy Jennings, Peggy Jennings              ACCOUNT NO.:  [id number]   MEDICAL RECORD NO.:  [id number]          PATIENT TYPE:  INP   LOCATION:  NA                           FACILITY:  [facility]   PHYSICIAN:  John L. Rendall, M.D.  DATE OF BIRTH:  02/01/1946   DATE OF ADMISSION:  11/11/2005  DATE OF DISCHARGE:                                HISTORY & PHYSICAL   CHIEF COMPLAINT:  Left knee pain for the last 2-3 years.   HISTORY OF PRESENT ILLNESS:  This 65 year old white female patient presented  to Dr. [name] with a two to three-year history of gradual onset of  progressively worsening left knee pain. She has a history of a right knee  arthroscopy in 2004 and then a left knee arthroscopy in April 2006 by Dr.  [name]. She has had no other injury or any other surgery to her knee.   At this point, the left knee pain is described as an intermittent dull to  very sharp sensation that is diffuse about the joint with radiation  proximally into the thigh and hip. She has a lot of stiffness in the knee.  The pain increases with any standing or walking and then seems to be much  worse with going up or down stairs. Pain seems to decrease with rest and  position changes. She complains of the knee popping, catching, grinding, and  swelling at times. There is no locking or giving way. The knee does feel  unstable, and she has some balance issues with it, and the pain does keep  her up at night at times. She is not ambulating with any assistive devices.  She has received cortisone injection in the past with minimal relief. No  viscus supplementation. She is currently taking tramadol on Mobic for pain  control, and that provides some relief.   She has no known drug allergies.   CURRENT MEDICATIONS:  1.  Micardis HCT 80/25 mg 1 tablet p.o. q.a.m.  2.  Mobic 7.5 mg 1 tablet p.o. twice daily.  3.  X four in 10/20 mg 1 tablet p.o. nightly.  4.  Diltiazem HCl ER 180 mg 1 tablet p.o. nightly.  5.  Cymbalta 60  mg 1 tablet p.o. to a.m.  6.  Altace 10 mg 1 tablet p.o. twice daily.  7.  __________1.2 5/0.65 mg 1 tablet p.o. nightly.  8.  Tramadol 1 tablet p.o. twice daily.  9.  Nexium 40 mg 1 tablet p.o. nightly.  10. Aspirin 81 mg 1 tablet p.o. q.a.m.  11. Albuterol inhaler 2 puffs inhaled q.4 h p.r.n.Marland Kitchen  12. Senior vitamins 1 tablet p.o. q.a.m.Marland Kitchen  13. Super Omega-3 two tablets p.o. q.a.m.  14. Chondroitin and glucosamine 2 tablets p.o. q.a.m.  15. Calcium 2 tablets p.o. q.a.m.   PAST MEDICAL HISTORY:  1.  Hypertension diagnosed 5 years ago.  2.  Asthma diagnosed 6-7 years ago.  3.  Fibromyalgia diagnosed 10 years ago.  4.  Gastroesophageal reflux disease diagnosed 10 years ago.  5.  Hypercholesterolemia diagnosed 5-6 years ago.  6.  Depression diagnosed 6-7 years ago.   She  denies any history of thyroid disease, hiatal hernia, peptic ulcer  disease, heart disease, or any other chronic medical condition other than  noted previously.   PAST SURGICAL HISTORY:  1.  Appendectomy 1960 by [name].  2.  Bilateral tubal ligation 1972 by [name].  3.  Total abdominal hysterectomy in 1976 by [name].  4.  Release, right foot plantar fascia in 2002 by [name].  5.  Right knee arthroscopy 2004 by [name].  6.  Removal of cyst and osteophyte left index finger 2006 January 6 by Dr.      Bevely Palmer.  7.  Left knee arthroscopy in April 2006 by [name].  8.  Fusion of the left index finger PIP joint October 2006 by [name].   She denies any complications from the above-mentioned procedures.   SOCIAL HISTORY:  She denies any history of cigarette smoking, alcohol use or  drug use. She is married and has five children, one daughter and four sons.  She and her husband live in a one-story house with three steps into the main  entrance. She is a Location manager for [employer]. Her medical doctor  is [name] at [phone]. This is at [facility] in   [place] [address] [place], [place] [phone].   FAMILY HISTORY:  Mother died at age of 103 with coronary artery disease, a  stroke, and a heart attack. Father is alive at age 34 with arthritis and  coronary artery disease and history of silent MI. She has three brothers,  ages 31, 74, and 52, and they are all alive and well; and one half-brother  who 43 with a history of a heart attack. Her daughter is 75, and her four  other sons are younger to age 55. They are all alive and healthy.   REVIEW OF SYSTEMS:  She does wear glasses. She has a remote history of  dizziness and syncope due to her menses, none since she has gone through  menopause. She does have allergies. She complains of stiff neck and trigger  points due to fibromyalgia. She has a history of tachycardia with no real  cause found. It was evaluated at the stress test, and that was negative. She  does complain of some constipation at times. She has a living will, and her  power of attorney and Ramond Dial Allred.  All other systems were negative  and noncontributory.   PHYSICAL EXAMINATION:  GENERAL: Well-developed, well-nourished overweight  white female in no acute distress. Talks easily with examiner. Mood and  affect are appropriate. Walks without a limp.  Height 5 feet for a quarter inches weight 125 pounds, BMI is 37-1/2.  VITAL SIGNS: Temperature 97.9 degrees Fahrenheit, pulse 80, respirations 20,  and blood pressure 140/68.  HEENT: Normocephalic, atraumatic without frontal or maxillary sinus  tenderness to palpation. Conjunctiva pink. Sclerae anicteric. PERLA. EOMs  intact. No visible external ear deformities. Hearing grossly intact.  Tympanic membranes pearly gray bilaterally with good light reflex. Nose and  nasal septum midline. Nasal mucosa pink and moist without exudates or polyps noted. Buccal mucosa pink and moist. Dentition in good repair. Pharynx  without erythema or exudates. Tongue  and uvula midline. Tongue without  fasciculations, and uvula rises equally with phonation.  NECK: No visible masses or lesions noted. Trachea midline. No palpable  lymphadenopathy or thyromegaly. Carotids +2 bilaterally without bruits.  Range of motion of her neck is  a little bit decreased, but it seems to be  about even to each direction.  CARDIOVASCULAR: Heart rate and rhythm regular. S1-S2 present without rubs,  clicks or murmurs noted.  RESPIRATORY: Respirations even and unlabored. Breath sounds clear to  auscultation bilaterally without rales or wheezes noted.  ABDOMEN:  Rounded abdominal contour. Bowel sounds present x4 quadrants.  Soft, nontender to palpation without hepatosplenomegaly or CVA tenderness.  Femoral pulses +2 bilaterally. Nontender to palpation along the vertebral  column.  BREASTS/GU/RECTAL/PELVIC: These exams deferred at this time.  MUSCULOSKELETAL: She does have a splint and bandage on her left index  finger. There is no other deformity noted of her bilateral upper  extremities. She has full range of motion of these extremities without pain.  She has full range of motion or ankles and toes bilaterally. DP and PT  pulses are +2. Mild +1 to 2 pitting edema both lower extremities. No calf  pain with palpation and negative Homans' sign bilaterally.  She has full range of motion of her hips was full extension and flexion only  to about 100 degrees; her leg does hit her abdomen at that point. She has  good internal and external rotation without pain.  Right knee skin is intact. No erythema or ecchymosis. She has full extension  and flexion to 120 degrees without crepitus. She is tender to palpation on  both the medial and lateral joint line. There is a small effusion in the  knee, but she is stable to varus and valgus stress. Negative anterior  drawer. Left knee has well-healed arthroscopy portal sites. She is lacking  about 5-10 degrees of full extension and can flex  the knee to 110 degrees  with a moderate amount of crepitus. She is acutely tender to palpation over  both the medial and lateral joint line. Small effusion. Stable to varus and  valgus stress. Negative anterior drawer.  NEUROLOGIC:  Alert and oriented x3. Cranial nerves II-XII are grossly  intact. Strength 05/05 bilateral upper and lower extremities. Sensation  intact to light touch. Deep tendon reflexes 2+ bilateral upper and lower  extremities. Rapid alternating movements intact.   RADIOLOGIC FINDINGS:  X-rays taken of her left knee in October 2006 show  bone-on-bone contact in the medial compartment with cyst formation in the  medial femoral condyle and the medial tibial plateau at the margin.   IMPRESSION:  1.  End-stage osteoarthritis, left knee, medial compartment.  2.  Hypertension.  3.  Gastroesophageal reflux disease.  4.  Asthma.  5.  Fibromyalgia. 6.  Hypercholesterolemia.  7.  Depression.   PLAN:  Ms. Makarewicz will be admitted to [facility] on November 11, 2005, where she will undergo a left total knee arthroplasty by Dr. Jonny Ruiz L.  Rendall. She will undergo all the routine preoperative laboratory tests and  studies prior to this procedure. If she has any medical issues while she is  hospitalized, we will consult one the hospitalists.      Legrand Pitts Duffy, P.A.      John L. Rendall, M.D.  Electronically Signed    KED/MEDQ  D:  11/05/2005  T:  11/05/2005  Job:  [id number]   cc:   Abner Greenspan, M.D.  [place], [place]

## 2011-10-22 ENCOUNTER — Other Ambulatory Visit (HOSPITAL_COMMUNITY): Payer: Self-pay | Admitting: Orthopedic Surgery

## 2011-10-22 DIAGNOSIS — Z96659 Presence of unspecified artificial knee joint: Secondary | ICD-10-CM

## 2011-10-22 DIAGNOSIS — T84038A Mechanical loosening of other internal prosthetic joint, initial encounter: Secondary | ICD-10-CM

## 2011-10-28 ENCOUNTER — Encounter (HOSPITAL_COMMUNITY)
Admission: RE | Admit: 2011-10-28 | Discharge: 2011-10-28 | Disposition: A | Discharge status: Home / Self Care | Payer: Medicare Other | Source: Ambulatory Visit | Attending: Orthopedic Surgery | Admitting: Orthopedic Surgery

## 2011-10-28 ENCOUNTER — Encounter (HOSPITAL_COMMUNITY): Payer: Self-pay

## 2011-10-28 DIAGNOSIS — M25569 Pain in unspecified knee: Secondary | ICD-10-CM | POA: Insufficient documentation

## 2011-10-28 DIAGNOSIS — T84038A Mechanical loosening of other internal prosthetic joint, initial encounter: Secondary | ICD-10-CM

## 2011-10-28 DIAGNOSIS — Z96659 Presence of unspecified artificial knee joint: Secondary | ICD-10-CM | POA: Insufficient documentation

## 2011-10-28 MED ORDER — TECHNETIUM TC 99M MEDRONATE IV KIT
25.0000 | PACK | Freq: Once | INTRAVENOUS | Status: AC | PRN
  Administered 2011-10-28: 25 via INTRAVENOUS

## 2012-01-07 ENCOUNTER — Other Ambulatory Visit: Payer: Self-pay

## 2012-01-07 ENCOUNTER — Encounter (HOSPITAL_COMMUNITY): Payer: Self-pay

## 2012-01-07 ENCOUNTER — Encounter (HOSPITAL_COMMUNITY)
Admission: RE | Admit: 2012-01-07 | Discharge: 2012-01-07 | Disposition: A | Discharge status: Home / Self Care | Payer: Medicare Other | Source: Ambulatory Visit | Attending: Orthopedic Surgery | Admitting: Orthopedic Surgery

## 2012-01-07 ENCOUNTER — Other Ambulatory Visit: Payer: Self-pay | Admitting: Orthopedic Surgery

## 2012-01-07 ENCOUNTER — Encounter (HOSPITAL_COMMUNITY): Payer: Self-pay | Admitting: Pharmacy Technician

## 2012-01-07 HISTORY — DX: Personal history of other diseases of the respiratory system: Z87.09

## 2012-01-07 HISTORY — DX: Urinary tract infection, site not specified: N39.0

## 2012-01-07 HISTORY — DX: Personal history of diseases of the blood and blood-forming organs and certain disorders involving the immune mechanism: Z86.2

## 2012-01-07 HISTORY — DX: Personal history of other diseases of the circulatory system: Z86.79

## 2012-01-07 HISTORY — DX: Major depressive disorder, single episode, unspecified: F32.9

## 2012-01-07 HISTORY — DX: Acute upper respiratory infection, unspecified: J06.9

## 2012-01-07 HISTORY — DX: Shortness of breath: R06.02

## 2012-01-07 HISTORY — DX: Reserved for inherently not codable concepts without codable children: IMO0001

## 2012-01-07 HISTORY — DX: Encounter for other specified aftercare: Z51.89

## 2012-01-07 HISTORY — DX: Gastro-esophageal reflux disease without esophagitis: K21.9

## 2012-01-07 HISTORY — DX: Essential (primary) hypertension: I10

## 2012-01-07 HISTORY — DX: Fibromyalgia: M79.7

## 2012-01-07 HISTORY — DX: Cardiac arrhythmia, unspecified: I49.9

## 2012-01-07 HISTORY — DX: Hyperlipidemia, unspecified: E78.5

## 2012-01-07 HISTORY — DX: Depression, unspecified: F32.A

## 2012-01-07 LAB — COMPREHENSIVE METABOLIC PANEL
ALT: 23 U/L (ref 0–35)
Alkaline Phosphatase: 79 U/L (ref 39–117)
CO2: 31 mEq/L (ref 19–32)
GFR calc Af Amer: >90 mL/min (ref >90)
GFR calc non Af Amer: >90 mL/min (ref >90)
Glucose, Bld: 86 mg/dL (ref 70–99)
Potassium: 4.7 mEq/L (ref 3.5–5.1)
Sodium: 139 mEq/L (ref 135–145)

## 2012-01-07 LAB — URINALYSIS, ROUTINE W REFLEX MICROSCOPIC
Bilirubin Urine: NEGATIVE
Glucose, UA: NEGATIVE mg/dL
Hgb urine dipstick: NEGATIVE
Nitrite: NEGATIVE
Specific Gravity, Urine: 1.02 (ref 1.005–1.030)
pH: 5.5 (ref 5.0–8.0)

## 2012-01-07 LAB — SURGICAL PCR SCREEN: Staphylococcus aureus: NEGATIVE

## 2012-01-07 LAB — DIFFERENTIAL
Basophils Absolute: 0 10*3/uL (ref 0.0–0.1)
Basophils Relative: 0 % (ref 0–1)
Eosinophils Absolute: 0.3 10*3/uL (ref 0.0–0.7)
Eosinophils Relative: 4 % (ref 0–5)

## 2012-01-07 LAB — PROTIME-INR: Prothrombin Time: 12.2 seconds (ref 11.6–15.2)

## 2012-01-07 LAB — APTT: aPTT: 27 seconds (ref 24–37)

## 2012-01-07 LAB — CBC
HCT: 40.6 % (ref 36.0–46.0)
Hemoglobin: 13.9 g/dL (ref 12.0–15.0)
MCH: 31.3 pg (ref 26.0–34.0)
RBC: 4.44 MIL/uL (ref 3.87–5.11)

## 2012-01-07 MED ORDER — CHLORHEXIDINE GLUCONATE 4 % EX LIQD: 60.0000 mL | Freq: Once | CUTANEOUS | Status: DC

## 2012-01-07 NOTE — Pre-Procedure Instructions (Signed)
20 Peggy Jennings  01/07/2012   Your procedure is scheduled on:  Monday, January 20, 2012  Report to [facility] at 08:00 AM.  Call this number if you have problems the morning of surgery: [phone]   Remember:   Do not eat food:After Midnight.  May have clear liquids: up to 4 Hours before arrival. (04:00 AM)  Clear liquids include soda, tea, black coffee, apple or grape juice, broth.  Take these medicines the morning of surgery with A SIP OF WATER: Clonidine, Omeprazole, stop Meloxicam and Aspirin as instructed by surgeon, stop fish oil 5 days prior to surgery.    Do not wear jewelry, make-up or nail polish.  Do not wear lotions, powders, or perfumes. You may wear deodorant.  Do not shave 48 hours prior to surgery.  Do not bring valuables to the hospital.  Contacts, dentures or bridgework may not be worn into surgery.  Leave suitcase in the car. After surgery it may be brought to your room.  For patients admitted to the hospital, checkout time is 11:00 AM the day of discharge.   Patients discharged the day of surgery will not be allowed to drive home.  Name and phone number of your driver: N/A  Special Instructions: CHG Shower Use Special Wash: 1/2 bottle night before surgery and 1/2 bottle morning of surgery.   Please read over the following fact sheets that you were given: Pain Booklet, Coughing and Deep Breathing, Blood Transfusion Information, Total Joint Packet and Surgical Site Infection Prevention, CHG and Mupirocin Information Sheets

## 2012-01-07 NOTE — Progress Notes (Signed)
Called for orders in PAT at 1115 and 1300.

## 2012-01-07 NOTE — Progress Notes (Signed)
Patient has history of Artial Fibrillation. Had ablation performed by [facility]. Has not seen Cardiologist since around the ablation time (saw Dr. Dulce Sellar). [name] is primary care physician [facility] in [place]).

## 2012-01-07 NOTE — Progress Notes (Signed)
Patient instructed to stop Meloxicam on 2/22 and Aspirin 2/16 by surgeon. Instructed patient to stop Fish Oil 5 days prior to surgery. Patient usually takes majority of medicines at night. Instructed to take as scheduled.

## 2012-01-08 LAB — URINE CULTURE: Culture  Setup Time: 201302121714

## 2012-01-08 NOTE — Consult Note (Signed)
Anesthesia:  Patient is a 66 year old female scheduled for a right TK revision on 01/20/12.  History includes SVT s/p ablation, GERD, fibromyalgia, depression, HTN, HLD, asthma, anemia with history of a blood transfusion, GERD, non-smoker.  Labs and CXR noted.  EKG shows NSR.  I called to clarify when she had last been seen by Cardiologist Dr. Dulce Sellar.  She saw him over 5 years ago when she was having symptomatic palpitations.  Those symptoms resolved s/p ablation, and she has not required any recent follow-up.  She has tolerated bilateral TKA and THR since then.  She denies CP.  She does get occasional SOB that is associated with asthma exacerbations for which she uses inhalers.  She feels her breathing is at her baseline and is without acute symptoms at present.  Plan to proceed if no significant change in her cardiopulmonary status.

## 2012-01-09 ENCOUNTER — Other Ambulatory Visit (HOSPITAL_COMMUNITY): Payer: Medicare Other

## 2012-01-18 ENCOUNTER — Other Ambulatory Visit: Payer: Self-pay | Admitting: Orthopedic Surgery

## 2012-01-18 NOTE — H&P (Signed)
Peggy Jennings MRN:  [id number] DOB/SEX:  [birth year]/female  CHIEF COMPLAINT:  Painful right Knee  HISTORY: Patient is a 66 y.o. female presented with a history of pain in the right knee. Onset of symptoms was gradual starting several years ago with gradually worsening course since that time. The patient noted no past surgery on the right knee. Prior procedures on the knee include arthroplasty. Patient has been treated conservatively with over-the-counter NSAIDs and activity modification. Patient currently rates pain in the knee at 10 out of 10 with activity. There is pain at night.  PAST MEDICAL HISTORY: There are no active problems to display for this patient.  Past Medical History  Diagnosis Date  . Fibromyalgia   . History of atrial fibrillation     ablation performed in 2007.  Marland Kitchen Hypertension   . Dysrhythmia     Hx of atrial fib. See progress note.   . Hyperlipemia   . Shortness of breath   . Asthma   . GERD (gastroesophageal reflux disease)   . Recurrent upper respiratory infection (URI)   . History of bronchitis   . Urinary tract infection 2 months ago   . Depression   . Blood transfusion   . History of anemia    Past Surgical History  Procedure Date  . Appendectomy 1960  . Abdominal hysterectomy 1977  . Joint replacement 2-3 years    Right Hip  . Joint replacement 3-4 years ago    Left Hip  . Joint replacement 4 years ago     Right Knee  . Joint replacement 10 years ago     Left Knee  . Plantar fascia surgery 7-8 Years     Right Foot  . Finger surgery     left index finger, right thumb, cyst removal     MEDICATIONS:   (Not in a hospital admission)  ALLERGIES:   Allergies  Allergen Reactions  . Percocet (Oxycodone-Acetaminophen) Other (See Comments)    hallucinations    REVIEW OF SYSTEMS:  Pertinent items are noted in HPI.   FAMILY HISTORY:   Family History  Problem Relation Age of Onset  . Anesthesia problems Neg Hx   . Hypotension Neg Hx     . Malignant hyperthermia Neg Hx   . Pseudochol deficiency Neg Hx     SOCIAL HISTORY:   History  Substance Use Topics  . Smoking status: Never Smoker   . Smokeless tobacco: Never Used  . Alcohol Use: No     EXAMINATION:  Vital signs in last 24 hours: @VSRANGES @  General appearance: alert, cooperative and no distress Lungs: clear to auscultation bilaterally Heart: regular rate and rhythm, S1, S2 normal, no murmur, click, rub or gallop Abdomen: soft, non-tender; bowel sounds normal; no masses,  no organomegaly Extremities: extremities normal, atraumatic, no cyanosis or edema and Homans sign is negative, no sign of DVT Pulses: 2+ and symmetric Skin: Skin color, texture, turgor normal. No rashes or lesions Neurologic: Alert and oriented X 3, normal strength and tone. Normal symmetric reflexes. Normal coordination and gait  Musculoskeletal:  ROM 0-100, Ligaments intact,  Imaging Review xrays show loose components  Assessment/Plan: painful right knee      The clearance notes were reviewed.  After discussion with the patient it was felt that Total Knee Revision was indicated. The procedure,  risks, and benefits of total knee arthroplasty were presented and reviewed. The risks including but not limited to aseptic loosening, infection, blood clots, vascular injury, stiffness, patella tracking problems  complications among others were discussed. The patient acknowledged the explanation, agreed to proceed with the plan.  Mansel Strother 01/18/2012, 3:20 PM

## 2012-01-19 MED ORDER — CEFAZOLIN SODIUM-DEXTROSE 2-3 GM-% IV SOLR
2.0000 g | INTRAVENOUS | Status: AC
  Administered 2012-01-20: 2 g via INTRAVENOUS
  Filled 2012-01-19: qty 50

## 2012-01-20 ENCOUNTER — Encounter (HOSPITAL_COMMUNITY): Payer: Self-pay | Admitting: Vascular Surgery

## 2012-01-20 ENCOUNTER — Encounter (HOSPITAL_COMMUNITY): Payer: Self-pay | Admitting: *Deleted

## 2012-01-20 ENCOUNTER — Encounter (HOSPITAL_COMMUNITY)
Admission: RE | Disposition: A | Discharge status: Skilled Nursing Facility | Payer: Self-pay | Source: Ambulatory Visit | Attending: Orthopedic Surgery

## 2012-01-20 ENCOUNTER — Inpatient Hospital Stay (HOSPITAL_COMMUNITY)
Admission: RE | Admit: 2012-01-20 | Discharge: 2012-01-24 | DRG: 467 | Disposition: A | Discharge status: Skilled Nursing Facility | Payer: Medicare Other | Source: Ambulatory Visit | Attending: Orthopedic Surgery | Admitting: Orthopedic Surgery

## 2012-01-20 ENCOUNTER — Ambulatory Visit (HOSPITAL_COMMUNITY): Payer: Medicare Other | Admitting: Vascular Surgery

## 2012-01-20 DIAGNOSIS — IMO0001 Reserved for inherently not codable concepts without codable children: Secondary | ICD-10-CM | POA: Diagnosis present

## 2012-01-20 DIAGNOSIS — T84012A Broken internal right knee prosthesis, initial encounter: Secondary | ICD-10-CM

## 2012-01-20 DIAGNOSIS — T84019A Broken internal joint prosthesis, unspecified site, initial encounter: Principal | ICD-10-CM | POA: Diagnosis present

## 2012-01-20 DIAGNOSIS — I4891 Unspecified atrial fibrillation: Secondary | ICD-10-CM | POA: Diagnosis present

## 2012-01-20 DIAGNOSIS — Z96649 Presence of unspecified artificial hip joint: Secondary | ICD-10-CM

## 2012-01-20 DIAGNOSIS — D62 Acute posthemorrhagic anemia: Secondary | ICD-10-CM | POA: Diagnosis not present

## 2012-01-20 DIAGNOSIS — K219 Gastro-esophageal reflux disease without esophagitis: Secondary | ICD-10-CM | POA: Diagnosis present

## 2012-01-20 DIAGNOSIS — Z87440 Personal history of urinary (tract) infections: Secondary | ICD-10-CM

## 2012-01-20 DIAGNOSIS — Z886 Allergy status to analgesic agent status: Secondary | ICD-10-CM

## 2012-01-20 DIAGNOSIS — Z96659 Presence of unspecified artificial knee joint: Secondary | ICD-10-CM

## 2012-01-20 DIAGNOSIS — Z90710 Acquired absence of both cervix and uterus: Secondary | ICD-10-CM

## 2012-01-20 HISTORY — PX: TOTAL KNEE REVISION: SHX996

## 2012-01-20 HISTORY — PX: JOINT REPLACEMENT: SHX530

## 2012-01-20 SURGERY — TOTAL KNEE REVISION
Anesthesia: Regional | Site: Knee | Laterality: Right | Wound class: Clean

## 2012-01-20 MED ORDER — ALUM & MAG HYDROXIDE-SIMETH 200-200-20 MG/5ML PO SUSP: 30.0000 mL | ORAL | Status: DC | PRN

## 2012-01-20 MED ORDER — METHOCARBAMOL 100 MG/ML IJ SOLN
500.0000 mg | Freq: Four times a day (QID) | INTRAVENOUS | Status: DC | PRN
  Administered 2012-01-20: 500 mg via INTRAVENOUS
  Filled 2012-01-20: qty 5

## 2012-01-20 MED ORDER — CEFAZOLIN SODIUM-DEXTROSE 2-3 GM-% IV SOLR
2.0000 g | Freq: Four times a day (QID) | INTRAVENOUS | Status: AC
  Administered 2012-01-20 – 2012-01-22 (×6): 2 g via INTRAVENOUS
  Filled 2012-01-20 (×7): qty 50

## 2012-01-20 MED ORDER — HYDROMORPHONE HCL PF 1 MG/ML IJ SOLN
INTRAMUSCULAR | Status: AC
  Filled 2012-01-20: qty 1

## 2012-01-20 MED ORDER — ENOXAPARIN SODIUM 40 MG/0.4ML ~~LOC~~ SOLN
40.0000 mg | SUBCUTANEOUS | Status: DC
  Administered 2012-01-21 – 2012-01-24 (×4): 40 mg via SUBCUTANEOUS
  Filled 2012-01-20 (×6): qty 0.4

## 2012-01-20 MED ORDER — ACETAMINOPHEN 10 MG/ML IV SOLN
INTRAVENOUS | Status: AC
  Filled 2012-01-20: qty 100

## 2012-01-20 MED ORDER — ACETAMINOPHEN 325 MG PO TABS
650.0000 mg | ORAL_TABLET | Freq: Four times a day (QID) | ORAL | Status: DC | PRN
  Administered 2012-01-23: 650 mg via ORAL
  Filled 2012-01-20: qty 2

## 2012-01-20 MED ORDER — ONDANSETRON HCL 4 MG/2ML IJ SOLN: 4.0000 mg | Freq: Once | INTRAMUSCULAR | Status: DC | PRN

## 2012-01-20 MED ORDER — FLEET ENEMA 7-19 GM/118ML RE ENEM: 1.0000 | ENEMA | Freq: Once | RECTAL | Status: AC | PRN

## 2012-01-20 MED ORDER — LIDOCAINE HCL (CARDIAC) 20 MG/ML IV SOLN
INTRAVENOUS | Status: DC | PRN
  Administered 2012-01-20: 30 mg via INTRAVENOUS

## 2012-01-20 MED ORDER — FAMOTIDINE 20 MG PO TABS
20.0000 mg | ORAL_TABLET | Freq: Every day | ORAL | Status: DC
  Administered 2012-01-21 – 2012-01-24 (×4): 20 mg via ORAL
  Filled 2012-01-20 (×6): qty 1

## 2012-01-20 MED ORDER — BUPIVACAINE-EPINEPHRINE 0.25% -1:200000 IJ SOLN
INTRAMUSCULAR | Status: DC | PRN
  Administered 2012-01-20: 20 mL

## 2012-01-20 MED ORDER — OXYCODONE HCL 10 MG PO TB12
10.0000 mg | ORAL_TABLET | Freq: Two times a day (BID) | ORAL | Status: DC
  Administered 2012-01-20 – 2012-01-24 (×9): 10 mg via ORAL
  Filled 2012-01-20 (×9): qty 1

## 2012-01-20 MED ORDER — SIMVASTATIN 40 MG PO TABS
40.0000 mg | ORAL_TABLET | Freq: Every day | ORAL | Status: DC
  Administered 2012-01-20: 40 mg via ORAL
  Filled 2012-01-20 (×2): qty 1

## 2012-01-20 MED ORDER — DILTIAZEM HCL ER 240 MG PO CP24
240.0000 mg | ORAL_CAPSULE | Freq: Every day | ORAL | Status: DC
  Administered 2012-01-20 – 2012-01-24 (×5): 240 mg via ORAL
  Filled 2012-01-20 (×5): qty 1

## 2012-01-20 MED ORDER — DIPHENHYDRAMINE HCL 12.5 MG/5ML PO ELIX
12.5000 mg | ORAL_SOLUTION | ORAL | Status: DC | PRN
Start: 2012-01-20 — End: 2012-01-24
  Administered 2012-01-21 – 2012-01-22 (×3): 25 mg via ORAL
  Filled 2012-01-20 (×3): qty 5

## 2012-01-20 MED ORDER — MENTHOL 3 MG MT LOZG: 1.0000 | LOZENGE | OROMUCOSAL | Status: DC | PRN

## 2012-01-20 MED ORDER — METOCLOPRAMIDE HCL 10 MG PO TABS: 5.0000 mg | ORAL_TABLET | Freq: Three times a day (TID) | ORAL | Status: DC | PRN

## 2012-01-20 MED ORDER — ONDANSETRON HCL 4 MG PO TABS: 4.0000 mg | ORAL_TABLET | Freq: Four times a day (QID) | ORAL | Status: DC | PRN

## 2012-01-20 MED ORDER — ACETAMINOPHEN 650 MG RE SUPP: 650.0000 mg | Freq: Four times a day (QID) | RECTAL | Status: DC | PRN

## 2012-01-20 MED ORDER — FENTANYL CITRATE 0.05 MG/ML IJ SOLN
INTRAMUSCULAR | Status: DC | PRN
  Administered 2012-01-20 (×5): 25 ug via INTRAVENOUS
  Administered 2012-01-20 (×2): 50 ug via INTRAVENOUS
  Administered 2012-01-20 (×2): 25 ug via INTRAVENOUS
  Administered 2012-01-20: 50 ug via INTRAVENOUS
  Administered 2012-01-20 (×2): 100 ug via INTRAVENOUS
  Administered 2012-01-20 (×2): 25 ug via INTRAVENOUS
  Administered 2012-01-20: 50 ug via INTRAVENOUS
  Administered 2012-01-20: 25 ug via INTRAVENOUS

## 2012-01-20 MED ORDER — ACETAMINOPHEN 10 MG/ML IV SOLN
1000.0000 mg | Freq: Four times a day (QID) | INTRAVENOUS | Status: DC
  Administered 2012-01-20 (×2): 1000 mg via INTRAVENOUS

## 2012-01-20 MED ORDER — PHENYLEPHRINE HCL 10 MG/ML IJ SOLN
INTRAMUSCULAR | Status: DC | PRN
  Administered 2012-01-20 (×2): 80 ug via INTRAVENOUS
  Administered 2012-01-20: 40 ug via INTRAVENOUS

## 2012-01-20 MED ORDER — DULOXETINE HCL 60 MG PO CPEP
60.0000 mg | ORAL_CAPSULE | Freq: Every day | ORAL | Status: DC
  Administered 2012-01-21 – 2012-01-23 (×4): 60 mg via ORAL
  Filled 2012-01-20 (×7): qty 1

## 2012-01-20 MED ORDER — RAMIPRIL 10 MG PO TABS
10.0000 mg | ORAL_TABLET | Freq: Two times a day (BID) | ORAL | Status: DC
  Administered 2012-01-20 – 2012-01-24 (×6): 10 mg via ORAL
  Filled 2012-01-20 (×11): qty 1

## 2012-01-20 MED ORDER — MELOXICAM 7.5 MG PO TABS
7.5000 mg | ORAL_TABLET | Freq: Two times a day (BID) | ORAL | Status: DC
  Administered 2012-01-20 – 2012-01-24 (×8): 7.5 mg via ORAL
  Filled 2012-01-20 (×11): qty 1

## 2012-01-20 MED ORDER — CLONIDINE HCL 0.1 MG PO TABS
0.1000 mg | ORAL_TABLET | Freq: Two times a day (BID) | ORAL | Status: DC
  Administered 2012-01-20 – 2012-01-24 (×8): 0.1 mg via ORAL
  Filled 2012-01-20 (×11): qty 1

## 2012-01-20 MED ORDER — SENNOSIDES-DOCUSATE SODIUM 8.6-50 MG PO TABS: 1.0000 | ORAL_TABLET | Freq: Every evening | ORAL | Status: DC | PRN

## 2012-01-20 MED ORDER — HYDROMORPHONE HCL PF 1 MG/ML IJ SOLN
0.5000 mg | INTRAMUSCULAR | Status: DC | PRN
  Administered 2012-01-20 – 2012-01-21 (×3): 1 mg via INTRAVENOUS
  Filled 2012-01-20 (×4): qty 1

## 2012-01-20 MED ORDER — IRBESARTAN 300 MG PO TABS
300.0000 mg | ORAL_TABLET | Freq: Every day | ORAL | Status: DC
  Administered 2012-01-21 – 2012-01-24 (×4): 300 mg via ORAL
  Filled 2012-01-20 (×7): qty 1

## 2012-01-20 MED ORDER — BISACODYL 5 MG PO TBEC: 5.0000 mg | DELAYED_RELEASE_TABLET | Freq: Every day | ORAL | Status: DC | PRN

## 2012-01-20 MED ORDER — ONDANSETRON HCL 4 MG/2ML IJ SOLN
INTRAMUSCULAR | Status: DC | PRN
  Administered 2012-01-20 (×2): 4 mg via INTRAVENOUS

## 2012-01-20 MED ORDER — HYDROCODONE-ACETAMINOPHEN 7.5-325 MG PO TABS: 1.0000 | ORAL_TABLET | ORAL | Status: DC | PRN

## 2012-01-20 MED ORDER — MEPERIDINE HCL 25 MG/ML IJ SOLN: 6.2500 mg | INTRAMUSCULAR | Status: DC | PRN

## 2012-01-20 MED ORDER — METHOCARBAMOL 500 MG PO TABS
500.0000 mg | ORAL_TABLET | Freq: Four times a day (QID) | ORAL | Status: DC | PRN
  Administered 2012-01-21 – 2012-01-23 (×7): 500 mg via ORAL
  Filled 2012-01-20 (×7): qty 1

## 2012-01-20 MED ORDER — LACTATED RINGERS IV SOLN
INTRAVENOUS | Status: DC | PRN
Start: 2012-01-20 — End: 2012-01-20
  Administered 2012-01-20 (×4): via INTRAVENOUS

## 2012-01-20 MED ORDER — ZOLPIDEM TARTRATE 5 MG PO TABS
5.0000 mg | ORAL_TABLET | Freq: Every evening | ORAL | Status: DC | PRN
  Administered 2012-01-21: 5 mg via ORAL
  Filled 2012-01-20: qty 1

## 2012-01-20 MED ORDER — METOCLOPRAMIDE HCL 5 MG/ML IJ SOLN: 5.0000 mg | Freq: Three times a day (TID) | INTRAMUSCULAR | Status: DC | PRN

## 2012-01-20 MED ORDER — BUPIVACAINE 0.25 % ON-Q PUMP SINGLE CATH 300ML
300.0000 mL | INJECTION | Status: AC
  Administered 2012-01-20: 300 mL
  Filled 2012-01-20: qty 300

## 2012-01-20 MED ORDER — PANTOPRAZOLE SODIUM 40 MG PO TBEC
40.0000 mg | DELAYED_RELEASE_TABLET | Freq: Every day | ORAL | Status: DC
  Administered 2012-01-21 – 2012-01-24 (×4): 40 mg via ORAL
  Filled 2012-01-20 (×5): qty 1

## 2012-01-20 MED ORDER — ESTROGENS CONJUGATED 0.45 MG PO TABS
0.4500 mg | ORAL_TABLET | Freq: Every day | ORAL | Status: DC
  Administered 2012-01-21 – 2012-01-23 (×4): 0.45 mg via ORAL
  Filled 2012-01-20 (×6): qty 1

## 2012-01-20 MED ORDER — PROPOFOL 10 MG/ML IV EMUL
INTRAVENOUS | Status: DC | PRN
  Administered 2012-01-20: 20 mg via INTRAVENOUS
  Administered 2012-01-20: 200 mg via INTRAVENOUS
  Administered 2012-01-20: 20 mg via INTRAVENOUS

## 2012-01-20 MED ORDER — DOCUSATE SODIUM 100 MG PO CAPS
100.0000 mg | ORAL_CAPSULE | Freq: Two times a day (BID) | ORAL | Status: DC
  Administered 2012-01-20 – 2012-01-24 (×8): 100 mg via ORAL
  Filled 2012-01-20 (×11): qty 1

## 2012-01-20 MED ORDER — MILNACIPRAN HCL 50 MG PO TABS
50.0000 mg | ORAL_TABLET | Freq: Two times a day (BID) | ORAL | Status: DC
  Administered 2012-01-21 – 2012-01-24 (×7): 50 mg via ORAL
  Filled 2012-01-20 (×9): qty 1

## 2012-01-20 MED ORDER — PHENOL 1.4 % MT LIQD: 1.0000 | OROMUCOSAL | Status: DC | PRN

## 2012-01-20 MED ORDER — SODIUM CHLORIDE 0.9 % IR SOLN
Status: DC | PRN
  Administered 2012-01-20 (×2): 3000 mL

## 2012-01-20 MED ORDER — SODIUM CHLORIDE 0.9 % IV SOLN: INTRAVENOUS | Status: DC

## 2012-01-20 MED ORDER — ONDANSETRON HCL 4 MG/2ML IJ SOLN: 4.0000 mg | Freq: Four times a day (QID) | INTRAMUSCULAR | Status: DC | PRN

## 2012-01-20 MED ORDER — OMEPRAZOLE MAGNESIUM 20 MG PO TBEC: 20.0000 mg | DELAYED_RELEASE_TABLET | Freq: Every day | ORAL | Status: DC

## 2012-01-20 MED ORDER — MORPHINE SULFATE 2 MG/ML IJ SOLN: 0.0500 mg/kg | INTRAMUSCULAR | Status: DC | PRN

## 2012-01-20 MED ORDER — HYDROMORPHONE HCL PF 1 MG/ML IJ SOLN
0.2500 mg | INTRAMUSCULAR | Status: DC | PRN
  Administered 2012-01-20 (×4): 0.5 mg via INTRAVENOUS

## 2012-01-20 MED ORDER — METHOCARBAMOL 100 MG/ML IJ SOLN
500.0000 mg | INTRAVENOUS | Status: AC
  Administered 2012-01-20 (×2): 500 mg via INTRAVENOUS
  Filled 2012-01-20: qty 5

## 2012-01-20 MED ORDER — BUPIVACAINE ON-Q PAIN PUMP (FOR ORDER SET NO CHG)
INJECTION | Status: AC
  Filled 2012-01-20: qty 1

## 2012-01-20 SURGICAL SUPPLY — 82 items
Articular surface Zimmer ×2 IMPLANT
BANDAGE ESMARK 6X9 LF (GAUZE/BANDAGES/DRESSINGS) ×1 IMPLANT
BLADE SAGITTAL (BLADE)
BLADE SAGITTAL 13X1.27X60 (BLADE) ×2 IMPLANT
BLADE SAW SGTL 13X75X1.27 (BLADE) ×2 IMPLANT
BLADE SAW SGTL 83.5X18.5 (BLADE) ×2 IMPLANT
BLADE SAW THK.89X75X18XSGTL (BLADE) IMPLANT
BLADE SURG 10 STRL SS (BLADE) ×8 IMPLANT
BNDG ESMARK 6X9 LF (GAUZE/BANDAGES/DRESSINGS) ×2
BONE CHIP PRESERV 20CC (Bone Implant) ×4 IMPLANT
BOWL SMART MIX CTS (DISPOSABLE) ×2 IMPLANT
CATH KIT ON Q 7.5IN SLV (PAIN MANAGEMENT) ×2 IMPLANT
CLOTH BEACON ORANGE TIMEOUT ST (SAFETY) ×2 IMPLANT
COVER SURGICAL LIGHT HANDLE (MISCELLANEOUS) ×2 IMPLANT
CUFF TOURNIQUET SINGLE 34IN LL (TOURNIQUET CUFF) IMPLANT
CUFF TOURNIQUET SINGLE 44IN (TOURNIQUET CUFF) ×2 IMPLANT
DISTAL AUG ZIMMER (Orthopedic Implant) ×4 IMPLANT
DRAPE EXTREMITY T 121X128X90 (DRAPE) ×2 IMPLANT
DRAPE INCISE IOBAN 66X45 STRL (DRAPES) ×4 IMPLANT
DRAPE ORTHO SPLIT 77X108 STRL (DRAPES) ×1
DRAPE PROXIMA HALF (DRAPES) ×2 IMPLANT
DRAPE SURG ORHT 6 SPLT 77X108 (DRAPES) ×1 IMPLANT
DRAPE U-SHAPE 47X51 STRL (DRAPES) ×2 IMPLANT
DRSG ADAPTIC 3X8 NADH LF (GAUZE/BANDAGES/DRESSINGS) ×2 IMPLANT
DRSG PAD ABDOMINAL 8X10 ST (GAUZE/BANDAGES/DRESSINGS) ×2 IMPLANT
DURAPREP 26ML APPLICATOR (WOUND CARE) ×4 IMPLANT
ELECT REM PT RETURN 9FT ADLT (ELECTROSURGICAL) ×2
ELECTRODE REM PT RTRN 9FT ADLT (ELECTROSURGICAL) ×1 IMPLANT
EVACUATOR 1/8 PVC DRAIN (DRAIN) ×2 IMPLANT
FACESHIELD LNG OPTICON STERILE (SAFETY) ×2 IMPLANT
FEMORAL COMP ZIMMER (Orthopedic Implant) ×2 IMPLANT
GAUZE SPONGE 4X4 12PLY STRL LF (GAUZE/BANDAGES/DRESSINGS) ×2 IMPLANT
GLOVE BIOGEL PI IND STRL 6.5 (GLOVE) ×3 IMPLANT
GLOVE BIOGEL PI IND STRL 7.5 (GLOVE) ×1 IMPLANT
GLOVE BIOGEL PI IND STRL 8.5 (GLOVE) ×2 IMPLANT
GLOVE BIOGEL PI INDICATOR 6.5 (GLOVE) ×3
GLOVE BIOGEL PI INDICATOR 7.5 (GLOVE) ×1
GLOVE BIOGEL PI INDICATOR 8.5 (GLOVE) ×2
GLOVE SURG ORTHO 8.0 STRL STRW (GLOVE) ×8 IMPLANT
GLOVE SURG SS PI 6.5 STRL IVOR (GLOVE) ×4 IMPLANT
GOWN PREVENTION PLUS XLARGE (GOWN DISPOSABLE) ×8 IMPLANT
GOWN STRL NON-REIN LRG LVL3 (GOWN DISPOSABLE) IMPLANT
HANDPIECE INTERPULSE COAX TIP (DISPOSABLE) ×1
HOOD PEEL AWAY FACE SHEILD DIS (HOOD) ×8 IMPLANT
KIT BASIN OR (CUSTOM PROCEDURE TRAY) ×2 IMPLANT
KIT ROOM TURNOVER OR (KITS) ×2 IMPLANT
MANIFOLD NEPTUNE II (INSTRUMENTS) ×2 IMPLANT
NEEDLE 18GX1X1/2 (RX/OR ONLY) (NEEDLE) ×2 IMPLANT
NS IRRIG 1000ML POUR BTL (IV SOLUTION) ×2 IMPLANT
PACK TOTAL JOINT (CUSTOM PROCEDURE TRAY) ×2 IMPLANT
PAD ARMBOARD 7.5X6 YLW CONV (MISCELLANEOUS) ×4 IMPLANT
PADDING CAST COTTON 6X4 STRL (CAST SUPPLIES) ×2 IMPLANT
PADDING WEBRIL 6 STERILE (GAUZE/BANDAGES/DRESSINGS) ×2 IMPLANT
PATELLA ZIMMER 32MM (Orthopedic Implant) ×2 IMPLANT
POSITIONER HEAD PRONE TRACH (MISCELLANEOUS) ×2 IMPLANT
Palacose cement with Gentamicin 40.8 gram ×2 IMPLANT
Palacose cement with gentamicic 40.8 grams ×2 IMPLANT
Palacose cement with gentamicin 40.8  gra,m ×4 IMPLANT
SET HNDPC FAN SPRY TIP SCT (DISPOSABLE) ×1 IMPLANT
SPONGE GAUZE 4X4 12PLY (GAUZE/BANDAGES/DRESSINGS) ×2 IMPLANT
STAPLER VISISTAT 35W (STAPLE) ×2 IMPLANT
STEM ST EXT ZIER 100X145X12X (Stem) ×1 IMPLANT
STEM ST EXT ZIER 100X145X16X (Stem) ×1 IMPLANT
STEM ST EXT ZIMMER (Stem) ×2 IMPLANT
SUCTION FRAZIER TIP 10 FR DISP (SUCTIONS) IMPLANT
SUT BONE WAX W31G (SUTURE) ×2 IMPLANT
SUT PDS AB 0 CT 36 (SUTURE) IMPLANT
SUT PDS AB 1 CT  36 (SUTURE)
SUT PDS AB 1 CT 36 (SUTURE) IMPLANT
SUT PDS AB 2-0 CT1 27 (SUTURE) IMPLANT
SUT VIC AB 0 CTB1 27 (SUTURE) ×6 IMPLANT
SUT VIC AB 1 CT1 27 (SUTURE) ×3
SUT VIC AB 1 CT1 27XBRD ANBCTR (SUTURE) ×3 IMPLANT
SUT VIC AB 2-0 CTB1 (SUTURE) ×6 IMPLANT
SYR 20ML ECCENTRIC (SYRINGE) ×2 IMPLANT
TIBIA COMP ZIMMER (Orthopedic Implant) ×2 IMPLANT
TOWEL OR 17X24 6PK STRL BLUE (TOWEL DISPOSABLE) ×2 IMPLANT
TOWEL OR 17X26 10 PK STRL BLUE (TOWEL DISPOSABLE) ×2 IMPLANT
TRAY FOLEY CATH 14FR (SET/KITS/TRAYS/PACK) ×2 IMPLANT
TUBE ANAEROBIC SPECIMEN COL (MISCELLANEOUS) IMPLANT
WATER STERILE IRR 1000ML POUR (IV SOLUTION) ×2 IMPLANT
YANKAUER SUCT BULB TIP NO VENT (SUCTIONS) ×2 IMPLANT

## 2012-01-20 NOTE — Progress Notes (Signed)
Pt has had 350 cc bloody hemovac output in 50 min.  Pa Peggy Jennings notified.  hemovac clamped at 1400 as ord.

## 2012-01-20 NOTE — Transfer of Care (Signed)
Immediate Anesthesia Transfer of Care Note  Patient: Peggy Jennings  Procedure(s) Performed: Procedure(s) (LRB): TOTAL KNEE REVISION (Right)  Patient Location: PACU  Anesthesia Type: General  Level of Consciousness: sedated  Airway & Oxygen Therapy: Patient Spontanous Breathing and Patient connected to face mask oxygen  Post-op Assessment: Report given to PACU RN and Post -op Vital signs reviewed and stable  Post vital signs: Reviewed and stable  Complications: No apparent anesthesia complications

## 2012-01-20 NOTE — Interval H&P Note (Signed)
History and Physical Interval Note:  01/20/2012 7:13 AM  Peggy Jennings  has presented today for surgery, with the diagnosis of loosening tibil tray, failed total right knee 996.43  The various methods of treatment have been discussed with the patient and family. After consideration of risks, benefits and other options for treatment, the patient has consented to  Procedure(s) (LRB): TOTAL KNEE REVISION (Right) as a surgical intervention .  The patients' history has been reviewed, patient examined, no change in status, stable for surgery.  I have reviewed the patients' chart and labs.  Questions were answered to the patient's satisfaction.     Fama Muenchow,STEPHEN D

## 2012-01-20 NOTE — Plan of Care (Signed)
Problem: Consults Goal: Diagnosis- Total Joint Replacement Outcome: Completed/Met Date Met:  01/20/12 Revision Total Knee Right

## 2012-01-20 NOTE — Anesthesia Preprocedure Evaluation (Signed)
Anesthesia Evaluation  Patient identified by MRN, date of birth, ID band Patient awake    Reviewed: Allergy & Precautions, H&P , NPO status , Patient's Chart, lab work & pertinent test results  Airway Mallampati: II TM Distance: <3 FB Neck ROM: full    Dental   Pulmonary asthma ,          Cardiovascular hypertension, Pt. on medications + dysrhythmias Supra Ventricular Tachycardia  History of SVT s/p ablation   Neuro/Psych Depression  Neuromuscular disease    GI/Hepatic GERD-  Medicated and Controlled,  Endo/Other  Morbid obesity  Renal/GU      Musculoskeletal  (+) Fibromyalgia -  Abdominal   Peds  Hematology   Anesthesia Other Findings   Reproductive/Obstetrics                           Anesthesia Physical Anesthesia Plan  ASA: II  Anesthesia Plan: General   Post-op Pain Management:    Induction:   Airway Management Planned: Oral ETT  Additional Equipment:   Intra-op Plan:   Post-operative Plan:   Informed Consent:   Dental advisory given  Plan Discussed with: Anesthesiologist  Anesthesia Plan Comments:         Anesthesia Quick Evaluation

## 2012-01-20 NOTE — H&P (View-Only) (Signed)
Peggy Jennings MRN:  [id number] DOB/SEX:  01/13/1946/female  CHIEF COMPLAINT:  Painful right Knee  HISTORY: Patient is a 65 y.o. female presented with a history of pain in the right knee. Onset of symptoms was gradual starting several years ago with gradually worsening course since that time. The patient noted no past surgery on the right knee. Prior procedures on the knee include arthroplasty. Patient has been treated conservatively with over-the-counter NSAIDs and activity modification. Patient currently rates pain in the knee at 10 out of 10 with activity. There is pain at night.  PAST MEDICAL HISTORY: There are no active problems to display for this patient.  Past Medical History  Diagnosis Date  . Fibromyalgia   . History of atrial fibrillation     ablation performed in 2007.  . Hypertension   . Dysrhythmia     Hx of atrial fib. See progress note.   . Hyperlipemia   . Shortness of breath   . Asthma   . GERD (gastroesophageal reflux disease)   . Recurrent upper respiratory infection (URI)   . History of bronchitis   . Urinary tract infection 2 months ago   . Depression   . Blood transfusion   . History of anemia    Past Surgical History  Procedure Date  . Appendectomy 1960  . Abdominal hysterectomy 1977  . Joint replacement 2-3 years    Right Hip  . Joint replacement 3-4 years ago    Left Hip  . Joint replacement 4 years ago     Right Knee  . Joint replacement 10 years ago     Left Knee  . Plantar fascia surgery 7-8 Years     Right Foot  . Finger surgery     left index finger, right thumb, cyst removal     MEDICATIONS:   (Not in a hospital admission)  ALLERGIES:   Allergies  Allergen Reactions  . Percocet (Oxycodone-Acetaminophen) Other (See Comments)    hallucinations    REVIEW OF SYSTEMS:  Pertinent items are noted in HPI.   FAMILY HISTORY:   Family History  Problem Relation Age of Onset  . Anesthesia problems Neg Hx   . Hypotension Neg Hx     . Malignant hyperthermia Neg Hx   . Pseudochol deficiency Neg Hx     SOCIAL HISTORY:   History  Substance Use Topics  . Smoking status: Never Smoker   . Smokeless tobacco: Never Used  . Alcohol Use: No     EXAMINATION:  Vital signs in last 24 hours: @VSRANGES@  General appearance: alert, cooperative and no distress Lungs: clear to auscultation bilaterally Heart: regular rate and rhythm, S1, S2 normal, no murmur, click, rub or gallop Abdomen: soft, non-tender; bowel sounds normal; no masses,  no organomegaly Extremities: extremities normal, atraumatic, no cyanosis or edema and Homans sign is negative, no sign of DVT Pulses: 2+ and symmetric Skin: Skin color, texture, turgor normal. No rashes or lesions Neurologic: Alert and oriented X 3, normal strength and tone. Normal symmetric reflexes. Normal coordination and gait  Musculoskeletal:  ROM 0-100, Ligaments intact,  Imaging Review xrays show loose components  Assessment/Plan: painful right knee      The clearance notes were reviewed.  After discussion with the patient it was felt that Total Knee Revision was indicated. The procedure,  risks, and benefits of total knee arthroplasty were presented and reviewed. The risks including but not limited to aseptic loosening, infection, blood clots, vascular injury, stiffness, patella tracking problems   complications among others were discussed. The patient acknowledged the explanation, agreed to proceed with the plan.  Peggy Jennings 01/18/2012, 3:20 PM   

## 2012-01-20 NOTE — Progress Notes (Signed)
Orthopedic Tech Progress Note Patient Details:  Peggy Jennings 06/28/1946 [id number]  CPM Right Knee CPM Right Knee: On Right Knee Flexion (Degrees): 90  Right Knee Extension (Degrees): 0  [employer] at [employer] 01/20/2012, 1:19 PM

## 2012-01-20 NOTE — Op Note (Signed)
Dictation number [id number]

## 2012-01-20 NOTE — Anesthesia Procedure Notes (Signed)
Procedure Name: LMA Insertion Date/Time: 01/20/2012 9:04 AM Performed by: Malachi Pro Pre-anesthesia Checklist: Patient identified, Emergency Drugs available, Suction available, Patient being monitored and Timeout performed Patient Re-evaluated:Patient Re-evaluated prior to inductionOxygen Delivery Method: Circle system utilized Preoxygenation: Pre-oxygenation with 100% oxygen Intubation Type: IV induction LMA: LMA inserted LMA Size: 4.0 Number of attempts: 1 Tube secured with: Tape Dental Injury: Teeth and Oropharynx as per pre-operative assessment

## 2012-01-20 NOTE — Anesthesia Postprocedure Evaluation (Signed)
  Anesthesia Post-op Note  Patient: Peggy Jennings  Procedure(s) Performed: Procedure(s) (LRB): TOTAL KNEE REVISION (Right)  Patient Location: PACU  Anesthesia Type: GA combined with regional for post-op pain  Level of Consciousness: awake, alert  and oriented  Airway and Oxygen Therapy: Patient Spontanous Breathing and Patient connected to nasal cannula oxygen  Post-op Pain: intense  Post-op Assessment: Post-op Vital signs reviewed, Patient's Cardiovascular Status Stable, Respiratory Function Stable, Patent Airway, No signs of Nausea or vomiting and Pain level controlled  Post-op Vital Signs: Reviewed and stable  Complications: No apparent anesthesia complications

## 2012-01-20 NOTE — Progress Notes (Signed)
Upon repeat inspection of pt's dressing to R knee, there was significant bloody drainage which had saturated dressing. TED hose and web drsg was removed. ABDx2 and kerlex were applied with new TED covering. Pt's leg was cleaned and bed sheets were changed.  Peggy Jennings

## 2012-01-20 NOTE — Preoperative (Signed)
Beta Blockers   Reason not to administer Beta Blockers:Not Applicable 

## 2012-01-21 ENCOUNTER — Inpatient Hospital Stay (HOSPITAL_COMMUNITY): Payer: Medicare Other

## 2012-01-21 LAB — CBC
HCT: 27.5 % — ABNORMAL LOW (ref 36.0–46.0)
Hemoglobin: 9.4 g/dL — ABNORMAL LOW (ref 12.0–15.0)
MCH: 31.2 pg (ref 26.0–34.0)
MCHC: 34.2 g/dL (ref 30.0–36.0)
MCV: 91.4 fL (ref 78.0–100.0)
Platelets: 163 K/uL (ref 150–400)
RBC: 3.01 MIL/uL — ABNORMAL LOW (ref 3.87–5.11)
RDW: 12.7 % (ref 11.5–15.5)
WBC: 11.8 K/uL — ABNORMAL HIGH (ref 4.0–10.5)

## 2012-01-21 LAB — BASIC METABOLIC PANEL WITH GFR
BUN: 12 mg/dL (ref 6–23)
CO2: 27 meq/L (ref 19–32)
Calcium: 8.6 mg/dL (ref 8.4–10.5)
Chloride: 103 meq/L (ref 96–112)
Creatinine, Ser: 0.56 mg/dL (ref 0.50–1.10)
GFR calc Af Amer: >90 mL/min
GFR calc non Af Amer: >90 mL/min
Glucose, Bld: 131 mg/dL — ABNORMAL HIGH (ref 70–99)
Potassium: 4.7 meq/L (ref 3.5–5.1)
Sodium: 137 meq/L (ref 135–145)

## 2012-01-21 MED ORDER — HYDROCODONE-ACETAMINOPHEN 5-325 MG PO TABS
1.5000 | ORAL_TABLET | ORAL | Status: DC | PRN
  Administered 2012-01-21 – 2012-01-22 (×6): 2 via ORAL
  Administered 2012-01-23: 3 via ORAL
  Administered 2012-01-23 – 2012-01-24 (×2): 2 via ORAL
  Filled 2012-01-21 (×5): qty 2
  Filled 2012-01-21: qty 3
  Filled 2012-01-21 (×2): qty 2

## 2012-01-21 MED ORDER — ATORVASTATIN CALCIUM 10 MG PO TABS
20.0000 mg | ORAL_TABLET | Freq: Every day | ORAL | Status: DC
  Administered 2012-01-22 – 2012-01-23 (×2): 20 mg via ORAL
  Filled 2012-01-21 (×6): qty 2

## 2012-01-21 MED ORDER — HYDROCODONE-ACETAMINOPHEN 5-325 MG PO TABS
1.5000 | ORAL_TABLET | ORAL | Status: DC | PRN
  Filled 2012-01-21: qty 2

## 2012-01-21 NOTE — Progress Notes (Signed)
Physical Therapy Evaluation    01/21/12 0900  PT Visit Information  Last PT Received On 01/21/12  Patient Stated Goals  Goal #1 Home  Precautions  Precautions Knee  Restrictions  Weight Bearing Restrictions Yes  RLE Weight Bearing PWB  RLE Partial Weight Bearing Percentage or Pounds 50%  Home Living  Lives With Spouse  Receives Help From Family  Type of Home House  Home Layout One level  Home Access Ramped entrance  Bathroom Shower/Tub Walk-in shower  Bathroom Toilet Handicapped height  Bathroom Accessibility Yes  How Accessible Accessible via walker  Home Adaptive Equipment Walker - rolling;Straight cane;Bedside commode/3-in-1;Shower chair with back;Reacher;Sock aid;Long-handled shoehorn;Long-handled sponge  Prior Function  Level of Independence Independent with basic ADLs;Independent with homemaking with ambulation;Independent with gait;Independent with transfers  Able to Take Stairs? Yes  Driving Yes  Vocation Retired  Engineer, building services Level Oriented X4  Bed Mobility  Bed Mobility Yes  Supine to Sit 3: Mod assist  Supine to Sit Details (indicate cue type and reason) A with R LE and trunk.  cues for use of UEs and sequencing.    Sitting - Scoot to [employer] of Bed 5: Supervision  Sitting - Scoot to Edge of Bed Details (indicate cue type and reason) cues for reciprocal scoot  Transfers  Transfers Yes  Sit to Stand 4: Min assist;With upper extremity assist;From bed;From chair/3-in-1  Sit to Stand Details (indicate cue type and reason) cues for use of UEs  Stand to Sit 4: Min assist;With upper extremity assist;With armrests;To chair/3-in-1  Stand to Sit Details cues for use of UEs on armrests, control descent and positioning of LEs  Ambulation/Gait  Ambulation/Gait Yes  Ambulation/Gait Assistance 4: Min assist  Ambulation/Gait Assistance Details (indicate cue type and reason) cues for sequencing, use of RW, upright posture.  pt notes nerver block still hasn't worn off  and feel like she doesn't have full control of her knee  Ambulation Distance (Feet) 70 Feet  Assistive device Rolling walker  Gait Pattern Step-to pattern;Decreased step length - left;Decreased stance time - right;Trunk flexed  Stairs No  Wheelchair Mobility  Wheelchair Mobility No  Posture/Postural Control  Posture/Postural Control No significant limitations  Balance  Balance Assessed No  RLE Assessment  RLE Assessment X  RLE AROM (degrees)  RLE Overall AROM Comments pt still with nerve block.  AAROM ~15-70  LLE Assessment  LLE Assessment [facility]  Exercises  Exercises Total Joint  Total Joint Exercises  Ankle Circles/Pumps AROM;Both;10 reps  Quad Sets AROM;Both;10 reps  PT - End of Session  Equipment Utilized During Treatment Gait belt  Activity Tolerance Patient tolerated treatment well  Patient left in chair;with call bell in reach;with family/visitor present  Nurse Communication Mobility status for transfers;Mobility status for ambulation  CPM Right Knee  CPM Right Knee Off  General  Behavior During Session [facility] for tasks performed  [facility] for tasks performed  PT Assessment  Clinical Impression Statement pt presents s/p R TKA.  pt very motivated, but limited secondary to nerve block in R knee.  pt with limited control of R LE for mobility.  pt notes she had been thinking about D/C to [facility] for rehab, however now plans to D/C home with husband.    PT Recommendation/Assessment Patient will need skilled PT in the acute care venue  PT Problem List Decreased strength;Decreased range of motion;Decreased activity tolerance;Decreased balance;Decreased mobility;Decreased knowledge of use of DME;Decreased knowledge of precautions;Pain  Barriers to Discharge None  PT Therapy Diagnosis  Abnormality of gait;Acute pain  PT Plan  PT Frequency 7X/week  PT Treatment/Interventions DME instruction;Gait training;Functional mobility training;Therapeutic activities;Therapeutic  exercise;Balance training;Patient/family education  PT Recommendation  Recommendations for Other Services OT consult  Follow Up Recommendations Home health PT;Supervision/Assistance - 24 hour  Equipment Recommended None recommended by PT  Individuals Consulted  Consulted and Agree with Results and Recommendations Patient;Family member/caregiver  Family Member Consulted Husband  Acute Rehab PT Goals  PT Goal Formulation With patient  Time For Goal Achievement 2 weeks  Pt will go Supine/Side to Sit Independently  PT Goal: Supine/Side to Sit - Progress Goal set today  Pt will go Sit to Supine/Side Independently  PT Goal: Sit to Supine/Side - Progress Goal set today  Pt will go Sit to Stand with modified independence;with upper extremity assist  PT Goal: Sit to Stand - Progress Goal set today  Pt will Ambulate >150 feet;with modified independence;with rolling walker  PT Goal: Ambulate - Progress Goal set today  Pt will Perform Home Exercise Program Independently  PT Goal: Perform Home Exercise Program - Progress Goal set today     Mack Hook, PT [phone]

## 2012-01-21 NOTE — Progress Notes (Signed)
Notified PA of R knee xray results. He will be in to see pt momentarily. Tammy Sours

## 2012-01-21 NOTE — Progress Notes (Signed)
Was called to pt's room by NT; when I walked into room, pt was lying on her back on the floor. NT and pt state that the pt was bathing, while standing in front of recliner chair, with walker in front when R knee gave out and pt lost balance and fell. Several coworkers came to assist pt back to bed. VS: 160/70; 84; 98%. Pt states that she hit her head on the edge of a cushioned chair and the lateral side of R knee on floor.

## 2012-01-21 NOTE — Op Note (Signed)
NAMELOA, IDLER NO.:  [id number]  MEDICAL RECORD NO.:  [id number]  LOCATION:  5004                         FACILITY:  [facility]  PHYSICIAN:  Mila Homer. Sherlean Foot, M.D. DATE OF BIRTH:  1946/06/09  DATE OF PROCEDURE:  01/20/2012 DATE OF DISCHARGE:                              OPERATIVE REPORT   SURGEON:  Mila Homer. Sherlean Foot, MD  ASSISTANT:  Altamese Cabal, PA-C and Laural Benes. Su Hilt, PA-C  ANESTHESIA:  General.  PREOPERATIVE DIAGNOSIS:  Right failed total knee revision.  POSTOPERATIVE DIAGNOSIS:  Right failed total knee revision.  PROCEDURE:  Right revision total knee arthroplasty (all 3 components).  INDICATION FOR PROCEDURE:  The patient is a 66 year old white female with a 4-year total knee, which was completely loose on the radiographically, pain and informed consent obtained.  DESCRIPTION OF PROCEDURE:  The patient was laid in supine, administered general anesthesia, right leg was prepped and draped in usual sterile fashion.  The extremity was exsanguinated with the Esmarch and tourniquet was inflated to 350 mmHg and set for 2 hours.  I then used the old incision and made that with a #10 blade.  I then went down to the capsule, used a fresh #10 blade to incise the capsule and perform a straight paramedian arthrotomy.  Then, removed the synovium and the synovium was dark.  If it had metallosis, infiltrating it.  This was removed in its entirety with forceps and #10 blade.  I then everted the patella, easily snapped all the pin or the plastic button and there was an inch of Wiper wear on the medial aspect of that which I think was causing the metallosis.  I then went into 90 degrees of flexion and amputated the tibial post.  I then removed the femur easily just with my finger not even having to loosen it up, same thing with the tibia.  I then removed all the debris.  There was a large cyst in the tibia, it was contained probably 20 mL in volume.  I then reamed  the 6 pins on the femur and 12 on the tibia.  I put the provisional box cutter in place with two distal 5-mm augments on that and cut for the box.  I then placed a size E femur with a 16 stem on it and made distal cuts for those 5-mm augments and facet on her possible bone.  I then removed the femoral component knowing that what I would used to cement in with the real parts.  I turned my attention to the tibia.  I had to put the 12 down and freshened the cut, went down to a size 3 tibial tray drilled and keeled.  Of course, a lot of bone loss in both the femur and the tibia.  I then trialed with the 12 stem tibia and 3 base plate, and then really had pretty good flexion-extension gap and balance with a 20, but decided to go with a CCK just due to the size of the patient and the nature of the bone loss.  So, I put the CCK and had excellent stability. I put the patella and also had good patellar tracking.  I then  removed the trial components and copiously irrigated it.  I then cemented in about 30 mL of bone graft into the tibia little into the femur as well on the lateral femoral condyle about 5 mL.  I then cemented in all 3 components, removed all excess cement and allowed to cement to harden extension, had snapped in the CCK and put the CCK screw in place.  I then let the tourniquet down exactly 2 hours and 3 minutes.  Obtained hemostasis and lavaged again.  I placed a Hemovac coming out superolaterally and deep to the arthrotomy, pain catheter coming out superomedially and superficial to the arthrotomy.  I closed the arthrotomy with figure-of-eight #1 normal Vicryl sutures, buried 0 Vicryl sutures, subcuticular 2-0 Vicryl stitches and skin staples. Dressed with Xeroform, dry sponges, sterile Webril, Ted stocking.  COMPLICATIONS:  None.  DRAINS:  One Hemovac and one pain catheter.  EBL:  300 mL.          ______________________________ Mila Homer. Sherlean Foot, M.D.     SDL/MEDQ  D:   01/20/2012  T:  01/21/2012  Job:  [id number]

## 2012-01-21 NOTE — Progress Notes (Signed)
OT Cancellation Note  Treatment cancelled today due to medical issues with patient which prohibited therapy. PT fell this pm wit staff. Awaiting results from xray.  [facility] Antonious Omahoney, OTR/L  [phone] 01/21/2012 01/21/2012, 3:57 PM

## 2012-01-21 NOTE — Progress Notes (Signed)
CARE MANAGEMENT NOTE 01/21/2012    Action/Plan:   Discharge planning. Spoke with patient and husband. Pt was going to SNF for rehab, now will be going home. Preoperatively setup with [facility], no changes. Has rolling walker and 3in1, CPM will be delivered by [employer].   Anticipated DC Date:  01/23/2012   Anticipated DC Plan:  HOME W HOME HEALTH SERVICES      DC Planning Services  CM consult      [facility] Choice  HOME HEALTH   Choice offered to / List presented to:  C-1 Patient   DME arranged  CPM      DME agency  [employer]     HH arranged  HH-2 PT      [facility] agency  [facility]   Status of service:  Completed, signed off  Discharge Disposition:  HOME W HOME HEALTH SERVICES

## 2012-01-21 NOTE — Progress Notes (Signed)
[name], MD   Altamese Cabal, PA-C [address] [place], [place], [place]  [address]                             [phone]   PROGRESS NOTE  Subjective:  negative for Chest Pain  negative for Shortness of Breath  negative for Nausea/Vomiting   negative for Calf Pain  negative for Bowel Movement   Tolerating Diet: yes         Patient reports pain as 5 on 0-10 scale.    Objective: Vital signs in last 24 hours:   Patient Vitals for the past 24 hrs:  BP Temp Temp src Pulse Resp SpO2  01/21/12 0544 118/63 mmHg 97.2 F (36.2 C) Oral 84  18  100 %  01/21/12 0324 125/63 mmHg 97.9 F (36.6 C) Oral 90  18  98 %  01/20/12 2130 142/59 mmHg 97.2 F (36.2 C) Oral 100  18  99 %  01/20/12 1501 148/62 mmHg 97.8 F (36.6 C) Oral 84  18  100 %  01/20/12 1445 - 97 F (36.1 C) - - 16  -  01/20/12 1430 - - - 83  17  100 %  01/20/12 1418 129/57 mmHg - - - - -  01/20/12 1415 - - - 80  16  99 %  01/20/12 1413 - - - 77  13  100 %  01/20/12 1402 149/58 mmHg - - - - -  01/20/12 1348 137/60 mmHg - - 80  10  100 %  01/20/12 1345 - - - 77  6  100 %  01/20/12 1344 - - - 80  15  100 %  01/20/12 1340 - - - 81  18  100 %  01/20/12 1330 151/70 mmHg - - 82  13  99 %  01/20/12 1315 132/54 mmHg - - 91  19  100 %  01/20/12 1300 - 97.8 F (36.6 C) - - - -    @flow {1959:LAST@   Intake/Output from previous day:   02/25 0701 - 02/26 0700 In: 4565 [P.O.:240; I.V.:4275] Out: 3375 [Urine:2350; Drains:675]   Intake/Output this shift:   02/26 0701 - 02/26 1900 In: -  Out: 300 [Urine:300]   Intake/Output      02/25 0701 - 02/26 0700 02/26 0701 - 02/27 0700   P.O. 240    I.V. 4275    IV Piggyback 50    Total Intake 4565    Urine 2350 300   Drains 675    Blood 350    Total Output 3375 300   Net +1190 -300           LABORATORY DATA:  Basename 01/21/12 0520  WBC 11.8*  HGB 9.4*  HCT 27.5*  PLT 163    Basename 01/21/12 0520  NA 137  K 4.7  CL 103  CO2 27  BUN 12  CREATININE  0.56  GLUCOSE 131*  CALCIUM 8.6   Lab Results  Component Value Date   INR 0.89 01/07/2012   INR 0.96 10/10/2009   INR 1.0 01/23/2009    Examination:  General appearance: alert, cooperative and no distress Extremities: extremities normal, atraumatic, no cyanosis or edema  Wound Exam: clean, dry, intact   Drainage:  Scant/small amount Serosanguinous exudate  Motor Exam: EHL and FHL Intact  Sensory Exam: Deep Peroneal normal  Vascular Exam:    Assessment:    1 Day Post-Op  Procedure(s) (LRB):  TOTAL KNEE REVISION (Right)  ADDITIONAL DIAGNOSIS:  Active Problems:  * No active hospital problems. *   Acute Blood Loss Anemia   Plan: Physical Therapy as ordered Partial Weight Bearing @ 50% (PWB)  DVT Prophylaxis:  Lovenox  DISCHARGE PLAN: Home  DISCHARGE NEEDS: HHPT, CPM, [place] and 3-in-1 comode seat         Taniah Reinecke 01/21/2012, 8:19 AM

## 2012-01-21 NOTE — Progress Notes (Signed)
PT Cancellation Note  Treatment cancelled today due to patient receiving procedure or test.  Pt fell in room and now headed to x-ray.  Will follow-up tomorrow.    Sunny Schlein, [place] [phone] 01/21/2012, 2:26 PM

## 2012-01-22 ENCOUNTER — Inpatient Hospital Stay (HOSPITAL_COMMUNITY): Payer: Medicare Other

## 2012-01-22 LAB — CBC
MCH: 31.3 pg (ref 26.0–34.0)
MCV: 91.6 fL (ref 78.0–100.0)
Platelets: 135 10*3/uL — ABNORMAL LOW (ref 150–400)
Platelets: 143 10*3/uL — ABNORMAL LOW (ref 150–400)
RBC: 2.56 MIL/uL — ABNORMAL LOW (ref 3.87–5.11)
RBC: 2.63 MIL/uL — ABNORMAL LOW (ref 3.87–5.11)
RDW: 12.9 % (ref 11.5–15.5)
RDW: 12.9 % (ref 11.5–15.5)
WBC: 8.6 10*3/uL (ref 4.0–10.5)
WBC: 9.8 10*3/uL (ref 4.0–10.5)

## 2012-01-22 LAB — BASIC METABOLIC PANEL
CO2: 29 mEq/L (ref 19–32)
Calcium: 8.8 mg/dL (ref 8.4–10.5)
Chloride: 104 mEq/L (ref 96–112)
GFR calc Af Amer: >90 mL/min (ref >90)
Sodium: 140 mEq/L (ref 135–145)

## 2012-01-22 NOTE — Progress Notes (Signed)
Utilization review completed. Lizette Pazos, RN, BSN. 01/22/12 

## 2012-01-22 NOTE — Progress Notes (Signed)
Physical Therapy Note   01/22/12 1000  PT Visit Information  Last PT Received On 01/22/12  Precautions  Precautions Knee  Required Braces or Orthoses Yes  Knee Immobilizer On when out of bed or walking (MD just called to order this during PT/OT)  Restrictions  Weight Bearing Restrictions Yes  RLE Weight Bearing TWB  Bed Mobility  Bed Mobility Yes  Supine to Sit 1: +2 Total assist;Patient percentage (comment) (pt 40%)  Supine to Sit Details (indicate cue type and reason) cues for sequencing.  A for R LE and trunk.    Sitting - Scoot to [place] of Bed 3: Mod assist  Transfers  Transfers Yes  Stand Pivot Transfers 1: +2 Total assist;Patient percentage (comment) (pt 70%)  Stand Pivot Transfer Details (indicate cue type and reason) cues for TDWB, use of UEs, anterior wt shift  Ambulation/Gait  Ambulation/Gait No  Stairs No  Wheelchair Mobility  Wheelchair Mobility No  Posture/Postural Control  Posture/Postural Control No significant limitations  Balance  Balance Assessed No  PT - End of Session  Equipment Utilized During Treatment Gait belt  Activity Tolerance Patient limited by pain  Patient left in chair;with call bell in reach;with family/visitor present  Nurse Communication Mobility status for transfers  General  Behavior During Session [facility] for tasks performed  Cognition [facility] for tasks performed  PT - Assessment/Plan  Comments on Treatment Session pt presents s/p TKA and fall yesterday causing distal femur fx.  pt now TDWBing and MD called during PT session asking RN to order KI for OOB activity.  Session limited to transfers only as pt was already sitting on commode when RN informed PT of new KI order.     PT Plan Frequency remains appropriate  PT Frequency 7X/week  Follow Up Recommendations Home health PT;Supervision/Assistance - 24 hour (Would benefit from SNF, pt nervous about D/C home now.  )  Equipment Recommended None recommended by PT  Acute Rehab PT Goals  PT  Goal: Supine/Side to Sit - Progress Progressing toward goal  PT Goal: Sit to Supine/Side - Progress Progressing toward goal  PT Goal: Sit to Stand - Progress Progressing toward goal     Mack Hook, PT [phone]

## 2012-01-22 NOTE — Progress Notes (Signed)
Clinical Social Work-CSW received referral for [facility] Rehab-CSW reviewed chart and case with RNCM/PN-pt will d/c home with home health-No CSW needs. Sign off-Azaleah Usman-MSW, [phone]

## 2012-01-22 NOTE — Progress Notes (Signed)
Clinical Social Work-CSW received word from pt/ot and PN that pt interested in SNF-Full assessment in shadow chart-Pt assessed by CSW intern and faxed to [facility]-should pt and treatment team feel appropriate CSW will facilitate d/c to SNF-Peggy Jennings-MSW,[phone]

## 2012-01-22 NOTE — Progress Notes (Signed)
Physical Therapy Note   01/22/12 1400  PT Visit Information  Last PT Received On 01/22/12  Precautions  Precautions Knee  Required Braces or Orthoses Yes  Knee Immobilizer On when out of bed or walking  Restrictions  Weight Bearing Restrictions Yes  RLE Weight Bearing TWB  Bed Mobility  Bed Mobility No  Transfers  Transfers No  Ambulation/Gait  Ambulation/Gait No  Stairs No  Wheelchair Mobility  Wheelchair Mobility No  Posture/Postural Control  Posture/Postural Control No significant limitations  Balance  Balance Assessed No  Exercises  Exercises Total Joint  Total Joint Exercises  Short Arc Quad AAROM;Right;10 reps;Supine  Heel Slides AAROM;Right;10 reps;Supine  Hip ABduction/ADduction AAROM;Right;10 reps;Supine  PT - End of Session  Equipment Utilized During Treatment (None)  Activity Tolerance Patient limited by pain  Patient left in bed;with call bell in reach;with family/visitor present (in CPM)  Nurse Communication (pt in CPM)  General  Behavior During Session [facility] for tasks performed  Cognition [facility] for tasks performed  PT - Assessment/Plan  Comments on Treatment Session pt presents s/p TKA and distal femur fx.  pt painful this pm, but agreeab;e to ther ex and CPM.  pt notes ther ex not as painful as she had thought it would be.  pt still concerned about D/C to home and wants ST-SNF, but husband wants D/C home.    PT Plan Frequency remains appropriate  PT Frequency 7X/week  Follow Up Recommendations Home health PT;Supervision/Assistance - 24 hour (Would benefit from SNF)  Equipment Recommended None recommended by PT  Acute Rehab PT Goals  PT Goal: Perform Home Exercise Program - Progress Progressing toward goal    Mack Hook, PT [phone]

## 2012-01-22 NOTE — Progress Notes (Signed)
  [name], MD   Altamese Cabal, PA-C [address] [place], [place], [place]  [address]                             [phone]   PROGRESS NOTE  Subjective:  negative for Chest Pain  negative for Shortness of Breath  negative for Nausea/Vomiting   negative for Calf Pain  negative for Bowel Movement   Tolerating Diet: yes         Patient reports pain as 7 on 0-10 scale.    Objective: Vital signs in last 24 hours:   Patient Vitals for the past 24 hrs:  BP Temp Temp src Pulse Resp SpO2  01/22/12 0629 170/56 mmHg 98.2 F (36.8 C) Oral 87  18  99 %  01/21/12 2200 182/71 mmHg 98 F (36.7 C) Oral 98  18  97 %  01/21/12 1500 139/81 mmHg 99 F (37.2 C) - 97  16  99 %  01/21/12 1300 160/70 mmHg - - 84  18  -  01/21/12 1101 160/78 mmHg - - - - -    @flow {1959:LAST@   Intake/Output from previous day:   02/26 0701 - 02/27 0700 In: 480 [P.O.:480] Out: 602 [Urine:601]   Intake/Output this shift:       Intake/Output      02/26 0701 - 02/27 0700 02/27 0701 - 02/28 0700   P.O. 480    I.V.     IV Piggyback     Total Intake 480    Urine 601    Drains     Stool 1    Blood     Total Output 602    Net -122         Urine Occurrence 2 x    Emesis Occurrence 2 x       LABORATORY DATA:  Basename 01/22/12 0525 01/21/12 0520  WBC 8.6 11.8*  HGB 8.0* 9.4*  HCT 23.5* 27.5*  PLT 135* 163    Basename 01/22/12 0525 01/21/12 0520  NA 140 137  K 3.9 4.7  CL 104 103  CO2 29 27  BUN 15 12  CREATININE 0.61 0.56  GLUCOSE 96 131*  CALCIUM 8.8 8.6   Lab Results  Component Value Date   INR 0.89 01/07/2012   INR 0.96 10/10/2009   INR 1.0 01/23/2009    Examination:  General appearance: alert, cooperative and no distress Extremities: Homans sign is negative, no sign of DVT  Wound Exam: clean, dry, intact   Drainage:  None: wound tissue dry  Motor Exam: EHL and FHL Intact  Sensory Exam: Deep Peroneal normal  Vascular Exam:    Assessment:    2 Days Post-Op   Procedure(s) (LRB): TOTAL KNEE REVISION (Right)  ADDITIONAL DIAGNOSIS:  Active Problems:  * No active hospital problems. *   Acute Blood Loss Anemia   Plan: Physical Therapy as ordered Touch Down Weight Bearing (TDWB)  DVT Prophylaxis:  Lovenox  DISCHARGE PLAN: Home  DISCHARGE NEEDS: HHPT, CPM, [place] and 3-in-1 comode seat  1. Fall yesterday, following non displaced femur fx         Peggy Jennings 01/22/2012, 7:24 AM

## 2012-01-22 NOTE — Evaluation (Signed)
Occupational Therapy Evaluation Patient Details Name: Peggy Jennings MRN: [id number] DOB: 03-Apr-1946 Today's Date: 01/22/2012  Problem List: There is no problem list on file for this patient.   Past Medical History:  Past Medical History  Diagnosis Date  . Fibromyalgia   . History of atrial fibrillation     ablation performed in 2007.  Marland Kitchen Hypertension   . Dysrhythmia     Hx of atrial fib. See progress note.   . Hyperlipemia   . Shortness of breath   . Asthma   . GERD (gastroesophageal reflux disease)   . Recurrent upper respiratory infection (URI)   . History of bronchitis   . Urinary tract infection 2 months ago   . Depression   . Blood transfusion   . History of anemia    Past Surgical History:  Past Surgical History  Procedure Date  . Appendectomy 1960  . Abdominal hysterectomy 1977  . Joint replacement 2-3 years    Right Hip  . Joint replacement 3-4 years ago    Left Hip  . Joint replacement 4 years ago     Right Knee  . Joint replacement 10 years ago     Left Knee  . Plantar fascia surgery 7-8 Years     Right Foot  . Finger surgery     left index finger, right thumb, cyst removal    OT Assessment/Plan/Recommendation OT Assessment Clinical Impression Statement: Pt. s/p rt. TKA and with fall during hospital stay resulting in mild fracture rt. LE. Pt. will benefit from skilled OT to increase functional independence to supervision level at D/C OT Recommendation/Assessment: Patient will need skilled OT in the acute care venue OT Problem List: Decreased strength;Decreased activity tolerance;Impaired balance (sitting and/or standing);Decreased safety awareness;Decreased knowledge of use of DME or AE;Decreased knowledge of precautions;Pain Barriers to Discharge: None OT Therapy Diagnosis : Acute pain OT Plan OT Frequency: Min 2X/week OT Treatment/Interventions: Self-care/ADL training;DME and/or AE instruction;Patient/family education;Therapeutic activities;Balance  training OT Recommendation Follow Up Recommendations: Skilled nursing facility;  Equipment Recommended: Defer to next venue Individuals Consulted Consulted and Agree with Results and Recommendations: Patient OT Goals Acute Rehab OT Goals OT Goal Formulation: With patient Time For Goal Achievement: 2 weeks ADL Goals Pt Will Perform Lower Body Bathing: with min assist;Sit to stand from chair ADL Goal: Lower Body Bathing - Progress: Goal set today Pt Will Perform Lower Body Dressing: with min assist;Sit to stand from chair;with adaptive equipment ADL Goal: Lower Body Dressing - Progress: Goal set today Pt Will Transfer to Toilet: with min assist;Ambulation;with DME;3-in-1 ADL Goal: Toilet Transfer - Progress: Goal set today Pt Will Perform Toileting - Hygiene: with min assist;Sit to stand from 3-in-1/toilet ADL Goal: Toileting - Hygiene - Progress: Goal set today  OT Evaluation Precautions/Restrictions  Precautions Precautions: Knee Required Braces or Orthoses: Yes Knee Immobilizer: On when out of bed or walking (MD just called to order this during PT/OT) Restrictions Weight Bearing Restrictions: Yes RLE Weight Bearing: Touchdown weight bearing RLE Partial Weight Bearing Percentage or Pounds: 50% Prior Functioning Home Living Lives With: Spouse Receives Help From: Family Type of Home: House Home Layout: One level Home Access: Ramped entrance Bathroom Shower/Tub: Health visitor: Handicapped height Bathroom Accessibility: Yes How Accessible: Accessible via walker Home Adaptive Equipment: [place] - rolling;Straight cane;Bedside commode/3-in-1;Shower chair with back;Reacher;Sock aid;Long-handled shoehorn;Long-handled sponge Prior Function Level of Independence: Independent with basic ADLs;Independent with homemaking with ambulation;Independent with gait;Independent with transfers Able to Take Stairs?: Yes Driving: Yes Vocation: Retired ADL  ADL Eating/Feeding:  Performed;Independent Where Assessed - Eating/Feeding: Edge of bed Grooming: Performed;Teeth care;Set up Where Assessed - Grooming: Sitting, chair Upper Body Bathing: Simulated;Chest;Right arm;Abdomen;Left arm;Set up Where Assessed - Upper Body Bathing: Sitting, chair Lower Body Bathing: Simulated;Maximal assistance Where Assessed - Lower Body Bathing: Sit to stand from chair Upper Body Dressing: Performed;Minimal assistance Upper Body Dressing Details (indicate cue type and reason): with donning gown Where Assessed - Upper Body Dressing: Sitting, bed Lower Body Dressing: Performed;+1 Total assistance Lower Body Dressing Details (indicate cue type and reason): With donning bil socks Where Assessed - Lower Body Dressing: Sit to stand from chair Toilet Transfer: Simulated;+2 Total assistance;Comment for patient % (pt=70%) Toilet Transfer Details (indicate cue type and reason): Mod verbal cues for hand placement and technique for pivot on left foot and twb for rt. foot Toilet Transfer Method: Stand pivot Toilet Transfer Equipment: Bedside commode Toileting - Clothing Manipulation: Simulated;Moderate assistance Toileting - Clothing Manipulation Details (indicate cue type and reason): With gown Where Assessed - Toileting Clothing Manipulation: Sit to stand from 3-in-1 or toilet Toileting - Hygiene: Performed;Maximal assistance Toileting - Hygiene Details (indicate cue type and reason): Pt. relying on bil UE support on RW Where Assessed - Toileting Hygiene: Sit to stand from 3-in-1 or toilet Tub/Shower Transfer: Not assessed Tub/Shower Transfer Method: Not assessed Equipment Used: Rolling walker Ambulation Related to ADLs:  NA-pt. needs KI for ambulation and not yet delivered ADL Comments: Pt. educated on use of AE for LB ADLs and techniques for completing functional transfers with ADLs Vision/Perception  Vision - History Baseline Vision: No visual deficits Patient Visual Report: No change  from baseline Vision - Assessment Eye Alignment: Within Functional Limits Vision Assessment: Vision not tested    Extremity Assessment RUE Assessment RUE Assessment: Within Functional Limits LUE Assessment LUE Assessment: Within Functional Limits Mobility  Bed Mobility Bed Mobility: Yes Supine to Sit: 1: +2 Total assist;Patient percentage (comment) (pt 40%) Supine to Sit Details (indicate cue type and reason): cues for sequencing.  A for R LE and trunk.   Sitting - Scoot to Edge of Bed: 3: Mod assist Transfers Transfers: Yes Sit to Stand: 1: +2 Total assist;Patient percentage (comment);With upper extremity assist;From bed (pt=70%) Sit to Stand Details (indicate cue type and reason): Mod verbal cues for hand placement and technique for TWb rt. LE    End of Session OT - End of Session Equipment Utilized During Treatment: Gait belt Activity Tolerance: Patient tolerated treatment well Patient left: in chair;with call bell in reach Nurse Communication: Mobility status for transfers General Behavior During Session: [facility] for tasks performed Cognition: [facility] for tasks performed  Co-evaluation with Flora Lipps, OTR/L Pager [phone] 01/22/2012, 10:22 AM

## 2012-01-23 ENCOUNTER — Encounter (HOSPITAL_COMMUNITY): Payer: Self-pay | Admitting: Orthopedic Surgery

## 2012-01-23 LAB — CBC
HCT: 24 % — ABNORMAL LOW (ref 36.0–46.0)
Hemoglobin: 8 g/dL — ABNORMAL LOW (ref 12.0–15.0)
MCHC: 33.3 g/dL (ref 30.0–36.0)
RBC: 2.57 MIL/uL — ABNORMAL LOW (ref 3.87–5.11)
WBC: 7.9 10*3/uL (ref 4.0–10.5)

## 2012-01-23 LAB — BASIC METABOLIC PANEL
BUN: 13 mg/dL (ref 6–23)
CO2: 30 mEq/L (ref 19–32)
Chloride: 103 mEq/L (ref 96–112)
Glucose, Bld: 97 mg/dL (ref 70–99)
Potassium: 4 mEq/L (ref 3.5–5.1)
Sodium: 140 mEq/L (ref 135–145)

## 2012-01-23 MED ORDER — FUROSEMIDE 10 MG/ML IJ SOLN: 20.0000 mg | Freq: Once | INTRAMUSCULAR | Status: DC

## 2012-01-23 NOTE — Progress Notes (Signed)
  [name], MD   Peggy Cabal, PA-C [address] [place], [place], [place]  [address]                             [phone]   PROGRESS NOTE  Subjective:  negative for Chest Pain  negative for Shortness of Breath  negative for Nausea/Vomiting   negative for Calf Pain  negative for Bowel Movement   Tolerating Diet: yes         Patient reports pain as 7 on 0-10 scale.    Objective: Vital signs in last 24 hours:   Patient Vitals for the past 24 hrs:  BP Temp Pulse Resp SpO2  01/23/12 1229 119/64 mmHg 97.6 F (36.4 C) 96  18  94 %  01/23/12 0508 102/66 mmHg 98.3 F (36.8 C) 82  18  92 %  01/22/12 2205 164/76 mmHg - - - -  01/22/12 2117 164/76 mmHg 99.2 F (37.3 C) 88  18  99 %  01/22/12 1300 153/60 mmHg 98.9 F (37.2 C) 92  18  96 %    @flow {1959:LAST@   Intake/Output from previous day:   02/27 0701 - 02/28 0700 In: 480 [P.O.:480] Out: -    Intake/Output this shift:       Intake/Output      02/27 0701 - 02/28 0700 02/28 0701 - 03/01 0700   P.O. 480    Total Intake 480    Urine     Stool     Total Output     Net +480         Urine Occurrence 2 x       LABORATORY DATA:  Basename 01/23/12 0455 01/22/12 1249 01/22/12 0525 01/21/12 0520  WBC 7.9 9.8 8.6 11.8*  HGB 8.0* 8.2* 8.0* 9.4*  HCT 24.0* 24.1* 23.5* 27.5*  PLT 146* 143* 135* 163    Basename 01/23/12 0455 01/22/12 0525 01/21/12 0520  NA 140 140 137  K 4.0 3.9 4.7  CL 103 104 103  CO2 30 29 27   BUN 13 15 12   CREATININE 0.69 0.61 0.56  GLUCOSE 97 96 131*  CALCIUM 8.4 8.8 8.6   Lab Results  Component Value Date   INR 0.89 01/07/2012   INR 0.96 10/10/2009   INR 1.0 01/23/2009    Examination:  General appearance: alert, cooperative and no distress Extremities: Homans sign is negative, no sign of DVT  Wound Exam: clean, dry, intact   Drainage:  None: wound tissue dry  Motor Exam: EHL and FHL Intact  Sensory Exam: Deep Peroneal normal  Vascular Exam:    Assessment:    3 Days  Post-Op  Procedure(s) (LRB): TOTAL KNEE REVISION (Right)  ADDITIONAL DIAGNOSIS:  Active Problems:  * No active hospital problems. *   Acute Blood Loss Anemia   Plan: Physical Therapy as ordered Touch Down Weight Bearing (TDWB)  DVT Prophylaxis:  Lovenox  DISCHARGE PLAN: Skilled Nursing Facility/Rehab  DISCHARGE NEEDS: HHPT, CPM, [place] and 3-in-1 comode seat  1. 2 units od prbcs         Peggy Jennings 01/23/2012, 12:36 PM

## 2012-01-23 NOTE — Progress Notes (Signed)
Physical Therapy Note   01/23/12 1100  PT Visit Information  Last PT Received On 01/23/12  Precautions  Precautions Knee  Required Braces or Orthoses Yes  Knee Immobilizer On when out of bed or walking  Restrictions  Weight Bearing Restrictions Yes  RLE Weight Bearing TWB  Bed Mobility  Bed Mobility Yes  Supine to Sit 1: +2 Total assist;Patient percentage (comment) (pt 30%)  Supine to Sit Details (indicate cue type and reason) cues for sequencing and safe technique, A with R LE and trunk.  pad utilized.    Sitting - Scoot to Edge of Bed 2: Max assist  Sitting - Scoot to [place] of Bed Details (indicate cue type and reason) cues for use of UEs and A with positioning R LE  Transfers  Transfers Yes  Stand Pivot Transfers 1: +2 Total assist;Patient percentage (comment);With armrests (pt 30%)  Stand Pivot Transfer Details (indicate cue type and reason) pt with much more pain today and decreased ability to A PT.  PT's foot placed under pt's R as pt putting too much wt thru R LE.  Attempted use of RW during 1st transfer, but no AD during second transfer.    Ambulation/Gait  Ambulation/Gait No  Stairs No  Wheelchair Mobility  Wheelchair Mobility No  Posture/Postural Control  Posture/Postural Control No significant limitations  Balance  Balance Assessed No  PT - End of Session  Equipment Utilized During Treatment Gait belt  Activity Tolerance Patient limited by pain  Patient left in chair;with call bell in reach  Nurse Communication Mobility status for transfers (Discussed in creased pain with RN.  )  General  Behavior During Session [facility] for tasks performed  Cognition [facility] for tasks performed  PT - Assessment/Plan  Comments on Treatment Session pt presents s/p TKA and distal femur fx s/p fall.  pt with incresaed pain today and requiring increased A for mobility.  Discussed pain and level of A needed with RN and CNA.  Discussed with pt using bedpan for toileting as transfers are 10/10  pain for pt today and pt notes RN just gave meds ~1hr ago.  pt KI on prior to any mobility and left on pt while sitting in recliner.    PT Plan Frequency remains appropriate  PT Frequency 7X/week  Follow Up Recommendations Skilled nursing facility  Equipment Recommended Defer to next venue  Acute Rehab PT Goals  PT Goal: Supine/Side to Sit - Progress Not progressing  PT Goal: Sit to Stand - Progress Not progressing    Mack Hook, PT [phone]

## 2012-01-24 HISTORY — PX: ORIF FEMUR FRACTURE: SHX2119

## 2012-01-24 LAB — TYPE AND SCREEN
Antibody Screen: NEGATIVE
Unit division: 0

## 2012-01-24 LAB — DIFFERENTIAL
Basophils Absolute: 0 10*3/uL (ref 0.0–0.1)
Basophils Relative: 0 % (ref 0–1)
Monocytes Relative: 9 % (ref 3–12)
Neutro Abs: 3.6 10*3/uL (ref 1.7–7.7)
Neutrophils Relative %: 53 % (ref 43–77)

## 2012-01-24 LAB — BASIC METABOLIC PANEL
BUN: 11 mg/dL (ref 6–23)
GFR calc Af Amer: >90 mL/min (ref >90)
GFR calc non Af Amer: >90 mL/min (ref >90)
Potassium: 3.9 mEq/L (ref 3.5–5.1)
Sodium: 139 mEq/L (ref 135–145)

## 2012-01-24 LAB — CBC
Hemoglobin: 9.7 g/dL — ABNORMAL LOW (ref 12.0–15.0)
MCHC: 34.2 g/dL (ref 30.0–36.0)
WBC: 6.7 10*3/uL (ref 4.0–10.5)

## 2012-01-24 MED ORDER — METHOCARBAMOL 500 MG PO TABS: 500.0000 mg | ORAL_TABLET | Freq: Four times a day (QID) | ORAL | Status: AC | PRN

## 2012-01-24 MED ORDER — OXYCODONE HCL 10 MG PO TB12: 10.0000 mg | ORAL_TABLET | Freq: Two times a day (BID) | ORAL | Status: AC

## 2012-01-24 MED ORDER — ENOXAPARIN SODIUM 40 MG/0.4ML ~~LOC~~ SOLN: 40.0000 mg | SUBCUTANEOUS | Status: DC

## 2012-01-24 MED ORDER — HYDROCODONE-ACETAMINOPHEN 5-325 MG PO TABS: 1.5000 | ORAL_TABLET | ORAL | Status: AC | PRN

## 2012-01-24 NOTE — Progress Notes (Signed)
[name], MD   Altamese Cabal, PA-C [address] [place], [place], [place]  [address]                             [phone]   PROGRESS NOTE  Subjective:  negative for Chest Pain  negative for Shortness of Breath  negative for Nausea/Vomiting   negative for Calf Pain  negative for Bowel Movement   Tolerating Diet: yes         Patient reports pain as 6 on 0-10 scale.    Objective: Vital signs in last 24 hours:   Patient Vitals for the past 24 hrs:  BP Temp Temp src Pulse Resp SpO2  01/24/12 0940 150/82 mmHg - - 84  - 98 %  01/24/12 0116 108/57 mmHg 97.9 F (36.6 C) - 106  16  98 %  01/24/12 0012 154/54 mmHg 98.8 F (37.1 C) - 98  16  92 %  01/23/12 2300 146/54 mmHg 99.3 F (37.4 C) - 80  16  -  01/23/12 2145 154/60 mmHg 98.4 F (36.9 C) - 88  20  -  01/23/12 2100 158/60 mmHg - - - - -  01/23/12 2054 196/60 mmHg 99.5 F (37.5 C) - 99  20  95 %  01/23/12 1928 157/48 mmHg 98.8 F (37.1 C) Oral 90  16  97 %  01/23/12 1900 182/67 mmHg 98.5 F (36.9 C) Oral 90  18  100 %  01/23/12 1800 137/59 mmHg 98.4 F (36.9 C) Oral 89  18  100 %  01/23/12 1700 140/57 mmHg 97 F (36.1 C) Oral 90  18  -  01/23/12 1636 94/33 mmHg 98.6 F (37 C) - 86  18  100 %  01/23/12 1229 119/64 mmHg 97.6 F (36.4 C) - 96  18  94 %    @flow {1959:LAST@   Intake/Output from previous day:   02/28 0701 - 03/01 0700 In: 1252.5 [P.O.:240; I.V.:500] Out: 300 [Urine:300]   Intake/Output this shift:       Intake/Output      02/28 0701 - 03/01 0700 03/01 0701 - 03/02 0700   P.O. 240    I.V. 500    Blood 512.5    Total Intake 1252.5    Urine 300    Total Output 300    Net +952.5         Urine Occurrence 5 x       LABORATORY DATA:  Basename 01/24/12 0500 01/23/12 0455 01/22/12 1249 01/22/12 0525 01/21/12 0520  WBC 6.7 7.9 9.8 8.6 11.8*  HGB 9.7* 8.0* 8.2* 8.0* 9.4*  HCT 28.4* 24.0* 24.1* 23.5* 27.5*  PLT 149* 146* 143* 135* 163    Basename 01/24/12 0500 01/23/12 0455 01/22/12 0525  01/21/12 0520  NA 139 140 140 137  K 3.9 4.0 3.9 4.7  CL 103 103 104 103  CO2 28 30 29 27   BUN 11 13 15 12   CREATININE 0.54 0.69 0.61 0.56  GLUCOSE 103* 97 96 131*  CALCIUM 8.6 8.4 8.8 8.6   Lab Results  Component Value Date   INR 0.89 01/07/2012   INR 0.96 10/10/2009   INR 1.0 01/23/2009    Examination:  General appearance: alert, cooperative and no distress Extremities: Homans sign is negative, no sign of DVT  Wound Exam: clean, dry, intact   Drainage:  None: wound tissue dry  Motor Exam: EHL and FHL Intact  Sensory Exam: Deep Peroneal normal  Vascular Exam:    Assessment:    4 Days Post-Op  Procedure(s) (LRB): TOTAL KNEE REVISION (Right)  ADDITIONAL DIAGNOSIS:  Active Problems:  * No active hospital problems. *   Acute Blood Loss Anemia   Plan: Physical Therapy as ordered Touch Down Weight Bearing (TDWB)  DVT Prophylaxis:  Lovenox  DISCHARGE PLAN: Skilled Nursing Facility/Rehab  DISCHARGE NEEDS: HHPT, CPM, Walker and 3-in-1 comode seat         Peggy Jennings 01/24/2012, 11:44 AM

## 2012-01-24 NOTE — Progress Notes (Signed)
Clinical Social Work-CSW facilitated pt d/c to SNF with PTAR transport and d/c packet-no further needs at this time-Rai Severns-MSW, [phone]

## 2012-01-24 NOTE — Discharge Summary (Signed)
PATIENT ID:      Peggy Jennings  MRN:     [id number] DOB/AGE:    1946-02-14 / 66 y.o.     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 *   Past Medical History  Diagnosis Date  . Fibromyalgia   . History of atrial fibrillation     ablation performed in 2007.  Marland Kitchen Hypertension   . Dysrhythmia     Hx of atrial fib. See progress note.   . Hyperlipemia   . Shortness of breath   . Asthma   . GERD (gastroesophageal reflux disease)   . Recurrent upper respiratory infection (URI)   . History of bronchitis   . Urinary tract infection 2 months ago   . Depression   . Blood transfusion   . History of anemia     PROCEDURE: Procedure(s): TOTAL KNEE REVISION on 01/20/2012  CONSULTS:     HISTORY:  See H&P in chart  HOSPITAL COURSE:  Peggy Jennings is a 66 y.o. admitted on 01/20/2012 and found to have a diagnosis of loosening tibil tray, failed total right knee 996.43.  After appropriate laboratory studies were obtained  they were taken to the operating room on 01/20/2012 and underwent Procedure(s): TOTAL KNEE REVISION.   They were given perioperative antibiotics:  Anti-infectives     Start     Dose/Rate Route Frequency Ordered Stop   01/20/12 1800   ceFAZolin (ANCEF) IVPB 2 g/50 mL premix     Comments: Double doses after surgery because of revision surgery.  Medically indicated      2 g 100 mL/hr over 30 Minutes Intravenous 4 times per day 01/20/12 1513 01/22/12 0040   01/19/12 0930   ceFAZolin (ANCEF) IVPB 2 g/50 mL premix        2 g 100 mL/hr over 30 Minutes Intravenous 60 min pre-op 01/19/12 0923 01/20/12 0910        . Blood products given:2 units of CC  PRBC  Patient had a fall on POD# 1 while standing.  Mildly displaced fracture in distal femur.  Patients weight bearing status was changed from 50% partial to touchdown.  The patient does have a stem attached to femoral component and should hold fracture in place for adequate healing.   The remainder of the hospital course was dedicated to ambulation and strengthening.   The patient was discharged on 4 Days Post-Op in  Good condition.   DIAGNOSTIC STUDIES: Recent vital signs: Patient Vitals for the past 24 hrs:  BP Temp Temp src Pulse Resp SpO2  01/24/12 0940 150/82 mmHg - - 84  - 98 %  01/24/12 0116 108/57 mmHg 97.9 F (36.6 C) - 106  16  98 %  01/24/12 0012 154/54 mmHg 98.8 F (37.1 C) - 98  16  92 %  01/23/12 2300 146/54 mmHg 99.3 F (37.4 C) - 80  16  -  01/23/12 2145 154/60 mmHg 98.4 F (36.9 C) - 88  20  -  01/23/12 2100 158/60 mmHg - - - - -  01/23/12 2054 196/60 mmHg 99.5 F (37.5 C) - 99  20  95 %  01/23/12 1928 157/48 mmHg 98.8 F (37.1 C)  Oral 90  16  97 %  02-08-2012 1900 182/67 mmHg 98.5 F (36.9 C) Oral 90  18  100 %  08-Feb-2012 1800 137/59 mmHg 98.4 F (36.9 C) Oral 89  18  100 %  February 08, 2012 1700 140/57 mmHg 97 F (36.1 C) Oral 90  18  -  02-08-2012 1636 94/33 mmHg 98.6 F (37 C) - 86  18  100 %  2012-02-08 1229 119/64 mmHg 97.6 F (36.4 C) - 96  18  94 %       Recent laboratory studies:  Basename 01/24/12 0500 02-08-12 0455 01/22/12 1249 01/22/12 0525 01/21/12 0520  WBC 6.7 7.9 9.8 8.6 11.8*  HGB 9.7* 8.0* 8.2* 8.0* 9.4*  HCT 28.4* 24.0* 24.1* 23.5* 27.5*  PLT 149* 146* 143* 135* 163    Basename 01/24/12 0500 02-08-2012 0455 01/22/12 0525 01/21/12 0520  NA 139 140 140 137  K 3.9 4.0 3.9 4.7  CL 103 103 104 103  CO2 28 30 29 27   BUN 11 13 15 12   CREATININE 0.54 0.69 0.61 0.56  GLUCOSE 103* 97 96 131*  CALCIUM 8.6 8.4 8.8 8.6   Lab Results  Component Value Date   INR 0.89 01/07/2012   INR 0.96 10/10/2009   INR 1.0 01/23/2009     Recent Radiographic  Studies :  Dg Chest 2 View  01/07/2012  *RADIOLOGY REPORT*  Clinical Data: Preoperative assessment for right total knee arthroplasty, history asthma and hypertension  CHEST - 2 VIEW  Comparison: 01/23/2009  Findings: Upper-normal size of cardiac silhouette. Tortuous aorta. Pulmonary vascularity normal. Lungs clear. No pleural effusion or pneumothorax. No acute osseous findings. Broad-based dextroconvex thoracolumbar scoliosis with multilevel endplate spur formation.  IMPRESSION: No acute abnormalities.  Original Report Authenticated By: Lollie Marrow, M.D.   Dg Femur Right  01/22/2012  *RADIOLOGY REPORT*  Clinical Data: Postop femur fracture.  RIGHT FEMUR - 2 VIEW  Comparison: 01/21/2012  Findings: Postoperative changes from right knee replacement. Remote right hip replacement.  No hardware or bony complicating feature.  IMPRESSION: Right knee replacement without complicating feature.  Remote right hip replacement.  No acute findings.  Original Report Authenticated By: Cyndie Chime, M.D.   Dg Knee 1-2 Views Right  01/21/2012  *RADIOLOGY REPORT*  Clinical Data: Status post fall, status post total knee arthroplasty  RIGHT KNEE - 1-2 VIEW  Comparison: None.  Findings: Four views of the right knee submitted.  There is a right knee prosthesis in anatomic alignment.  Postsurgical changes are noted with postsurgical drain and midline skin staples.  There is mild displaced fracture distal anterior shaft of the right femur. This is just above the drain.  IMPRESSION: Right knee prosthesis in anatomic alignment.  Postsurgical changes are noted.  There is mild displaced fracture distal shaft of the right femur anterior aspect.  This was made a call report.  Original Report Authenticated By: Natasha Mead, M.D.    DISCHARGE INSTRUCTIONS: Discharge Orders    Future Orders Please Complete By Expires   Diet - low sodium heart healthy      Call MD / Call 911      Comments:   If you experience chest pain or shortness of  breath, CALL 911 and be transported to the hospital emergency room.  If you develope a fever above 101 F, pus (white drainage) or increased drainage or redness at the wound, or calf pain, call your surgeon's office.   Constipation Prevention      Comments:  Drink plenty of fluids.  Prune juice may be helpful.  You may use a stool softener, such as Colace (over the counter) 100 mg twice a day.  Use MiraLax (over the counter) for constipation as needed.   Increase activity slowly as tolerated      Weight Bearing as taught in Physical Therapy      Comments:   Use a walker or crutches as instructed.   Driving restrictions      Comments:   No driving for 6 weeks   Lifting restrictions      Comments:   No lifting for 6 weeks   CPM      Comments:   Continuous passive motion machine (CPM):      Use the CPM from 0 to 90 for 6-8 hours per day.      You may increase by 10 per day.  You may break it up into 2 or 3 sessions per day.      Use CPM for 3 weeks or until you are told to stop.   TED hose      Comments:   Use stockings (TED hose) for 3 weeks on both leg(s).  You may remove them at night for sleeping.   Change dressing      Comments:   Change dressing on friday, then change the dressing daily with sterile 4 x 4 inch gauze dressing and apply TED hose.  You may clean the incision with alcohol prior to redressing.   Do not put a pillow under the knee. Place it under the heel.         DISCHARGE MEDICATIONS:   Medication List  As of 01/24/2012 11:53 AM   STOP taking these medications         aspirin EC 81 MG tablet      FISH OIL CONCENTRATE PO         TAKE these medications         cimetidine 200 MG tablet   Commonly known as: TAGAMET   Take 200 mg by mouth at bedtime.      cloNIDine 0.1 MG tablet   Commonly known as: CATAPRES   Take 0.1 mg by mouth 2 (two) times daily.      diltiazem 240 MG 24 hr capsule   Commonly known as: DILACOR XR   Take 240 mg by mouth daily.       DULoxetine 60 MG capsule   Commonly known as: CYMBALTA   Take 60 mg by mouth at bedtime.      enoxaparin 40 MG/0.4ML Soln   Commonly known as: LOVENOX   Inject 0.4 mLs (40 mg total) into the skin daily.      estrogens (conjugated) 0.45 MG tablet   Commonly known as: PREMARIN   Take 0.45 mg by mouth at bedtime. Take daily for 21 days then do not take for 7 days.      HYDROcodone-acetaminophen 5-325 MG per tablet   Commonly known as: NORCO   Take 1.5-3 tablets by mouth every 4 (four) hours as needed.      meloxicam 7.5 MG tablet   Commonly known as: MOBIC   Take 7.5 mg by mouth 2 (two) times daily.      methocarbamol 500 MG tablet   Commonly known as: ROBAXIN   Take 1-2 tablets (500-1,000 mg total) by mouth every 6 (six) hours as needed.      mulitivitamin with minerals Tabs   Take 1 tablet by mouth  daily.      omeprazole 20 MG tablet   Commonly known as: PRILOSEC OTC   Take 20 mg by mouth daily.      oxyCODONE 10 MG 12 hr tablet   Commonly known as: OXYCONTIN   Take 1 tablet (10 mg total) by mouth every 12 (twelve) hours.      pravastatin 80 MG tablet   Commonly known as: PRAVACHOL   Take 80 mg by mouth at bedtime.      ramipril 10 MG tablet   Commonly known as: ALTACE   Take 10 mg by mouth 2 (two) times daily.      SAVELLA 50 MG Tabs   Generic drug: Milnacipran   Take 50 mg by mouth 2 (two) times daily.      telmisartan 80 MG tablet   Commonly known as: MICARDIS   Take 80 mg by mouth daily.      VITAMIN D (CHOLECALCIFEROL) PO   Take 5,000 Units by mouth at bedtime.            FOLLOW UP VISIT:   Follow-up Information    Follow up with Raymon Mutton, MD. Call on 02/04/2012.   Contact information:   [address] [employer] [place] [address] [phone]          DISPOSITION:  Skilled nursing facility    CONDITION:  Good   Dakwon Wenberg 01/24/2012, 11:53 AM

## 2012-01-24 NOTE — Discharge Instructions (Signed)
Home Health to be provided by [facility] [phone]  Diet: As you were doing prior to hospitalization   Activity:  Increase activity slowly as tolerated                  No lifting or driving for 6 weeks  Shower:  May shower without a dressing once there is no drainage from your wound.                 Do NOT wash over the wound.  If drainage remains, cover wound with saran                  Wrap and then shower.  Clean incision with betadine and change dressing                        After saran wrap removed.  Dressing:  You may change your dressing on Friday                    Then change the dressing daily with sterile 4"x4"s gauze dressing                     And TED hose for knees.  Use paper tape to hold dressing in place                     For hips.  You may clean the incision with alcohol prior to redressing.  Weight Bearing:  Weight bearing as tolerated as taught in physical therapy.  Use a                                walker or Crutches as instructed.  To prevent constipation: you may use a stool softener such as -               Colace ( over the counter) 100 mg by mouth twice a day                Drink plenty of fluids ( prune juice may be helpful) and high fiber foods                Miralax ( over the counter) for constipation as needed.    Precautions:  If you experience chest pain or shortness of breath - call 911 immediately               For transfer to the hospital emergency department!!               If you develop a fever greater that 101 F, purulent drainage from wound,                             increased redness or drainage from wound, or calf pain -- Call the office at                                                 [phone].  Follow- Up Appointment:  Please call for an appointment to be seen on 02/04/12  [place] - [phone]                   

## 2012-01-24 NOTE — Progress Notes (Signed)
Clinical Social Work-CSW confirmed bed with [facility] on 2/28-SNF can accept pt today or over weekend. CSW faxed pt/ot notes to pt insurance for prior auth on 2/28-CSW will faciliate d/c when pt medically cleared. Jodean Lima, [phone]

## 2012-01-24 NOTE — Progress Notes (Signed)
OT Cancellation Note  Treatment cancelled today due to pt. only wanting to do exercises with P.T. at this time. Will re-attempt as time allows.  Lakishia Bourassa, OTR/L Pager [phone] 01/24/2012, 1:42 PM

## 2012-01-24 NOTE — Progress Notes (Signed)
Pt  Discharged by ambulance as ordered to  [facility] husband at bedside. [address] [employer]

## 2012-01-24 NOTE — Progress Notes (Signed)
Physical Therapy Note   01/24/12 1300  PT Visit Information  Last PT Received On 01/24/12  Precautions  Precautions Knee  Required Braces or Orthoses Yes  Knee Immobilizer On when out of bed or walking  Restrictions  Weight Bearing Restrictions Yes  RLE Weight Bearing TWB  Bed Mobility  Bed Mobility No  Transfers  Transfers No  Ambulation/Gait  Ambulation/Gait No  Stairs No  Wheelchair Mobility  Wheelchair Mobility No  Posture/Postural Control  Posture/Postural Control No significant limitations  Balance  Balance Assessed No  Total Joint Exercises  Quad Sets AROM;Both;10 reps  Short Arc [employer];Right;10 reps  Heel Slides AAROM;Right;10 reps  Hip ABduction/ADduction AAROM;Right;10 reps  PT - End of Session  Equipment Utilized During Treatment (None)  Activity Tolerance Patient limited by pain  Patient left in bed;with call bell in reach  Nurse Communication Mobility status for transfers  General  Behavior During Session [facility] for tasks performed  [facility] for tasks performed  PT - Assessment/Plan  Comments on Treatment Session pt presents s/p TKA and fall resulting in distal femur fx.  pt and husband anticipate D/C to SNF today and are agreeable to ther ex at this time.    PT Plan Discharge plan remains appropriate;Frequency remains appropriate  PT Frequency 7X/week  Follow Up Recommendations Skilled nursing facility  Equipment Recommended Defer to next venue  Acute Rehab PT Goals  PT Goal: Perform Home Exercise Program - Progress Progressing toward goal    Mack Hook, PT [phone]

## 2012-02-05 ENCOUNTER — Inpatient Hospital Stay (HOSPITAL_COMMUNITY): Payer: Medicare Other

## 2012-02-05 ENCOUNTER — Inpatient Hospital Stay (HOSPITAL_COMMUNITY)
Admission: AD | Admit: 2012-02-05 | Discharge: 2012-02-10 | DRG: 481 | Disposition: A | Discharge status: Skilled Nursing Facility | Payer: Medicare Other | Source: Ambulatory Visit | Attending: Orthopedic Surgery | Admitting: Orthopedic Surgery

## 2012-02-05 ENCOUNTER — Encounter (HOSPITAL_COMMUNITY): Payer: Self-pay

## 2012-02-05 ENCOUNTER — Other Ambulatory Visit: Payer: Self-pay | Admitting: Orthopedic Surgery

## 2012-02-05 ENCOUNTER — Other Ambulatory Visit: Payer: Self-pay

## 2012-02-05 DIAGNOSIS — Y9210 Unspecified residential institution as the place of occurrence of the external cause: Secondary | ICD-10-CM | POA: Diagnosis present

## 2012-02-05 DIAGNOSIS — Y93E1 Activity, personal bathing and showering: Secondary | ICD-10-CM

## 2012-02-05 DIAGNOSIS — I10 Essential (primary) hypertension: Secondary | ICD-10-CM

## 2012-02-05 DIAGNOSIS — E785 Hyperlipidemia, unspecified: Secondary | ICD-10-CM | POA: Diagnosis present

## 2012-02-05 DIAGNOSIS — M9711XA Periprosthetic fracture around internal prosthetic right knee joint, initial encounter: Secondary | ICD-10-CM

## 2012-02-05 DIAGNOSIS — W19XXXA Unspecified fall, initial encounter: Secondary | ICD-10-CM | POA: Diagnosis present

## 2012-02-05 DIAGNOSIS — M797 Fibromyalgia: Secondary | ICD-10-CM

## 2012-02-05 DIAGNOSIS — I4891 Unspecified atrial fibrillation: Secondary | ICD-10-CM | POA: Diagnosis present

## 2012-02-05 DIAGNOSIS — Z96649 Presence of unspecified artificial hip joint: Secondary | ICD-10-CM

## 2012-02-05 DIAGNOSIS — D62 Acute posthemorrhagic anemia: Secondary | ICD-10-CM

## 2012-02-05 DIAGNOSIS — S72309A Unspecified fracture of shaft of unspecified femur, initial encounter for closed fracture: Principal | ICD-10-CM | POA: Diagnosis present

## 2012-02-05 DIAGNOSIS — Z79899 Other long term (current) drug therapy: Secondary | ICD-10-CM

## 2012-02-05 DIAGNOSIS — K219 Gastro-esophageal reflux disease without esophagitis: Secondary | ICD-10-CM | POA: Diagnosis present

## 2012-02-05 DIAGNOSIS — J45909 Unspecified asthma, uncomplicated: Secondary | ICD-10-CM | POA: Diagnosis present

## 2012-02-05 DIAGNOSIS — Z7901 Long term (current) use of anticoagulants: Secondary | ICD-10-CM

## 2012-02-05 DIAGNOSIS — IMO0001 Reserved for inherently not codable concepts without codable children: Secondary | ICD-10-CM | POA: Diagnosis present

## 2012-02-05 DIAGNOSIS — F329 Major depressive disorder, single episode, unspecified: Secondary | ICD-10-CM | POA: Diagnosis present

## 2012-02-05 DIAGNOSIS — Z96659 Presence of unspecified artificial knee joint: Secondary | ICD-10-CM

## 2012-02-05 DIAGNOSIS — F3289 Other specified depressive episodes: Secondary | ICD-10-CM | POA: Diagnosis present

## 2012-02-05 LAB — DIFFERENTIAL
Basophils Absolute: 0 10*3/uL (ref 0.0–0.1)
Lymphocytes Relative: 30 % (ref 12–46)
Lymphs Abs: 2.2 10*3/uL (ref 0.7–4.0)
Neutrophils Relative %: 51 % (ref 43–77)

## 2012-02-05 LAB — URINALYSIS, ROUTINE W REFLEX MICROSCOPIC
Nitrite: NEGATIVE
Protein, ur: NEGATIVE mg/dL
Specific Gravity, Urine: 1.023 (ref 1.005–1.030)
Urobilinogen, UA: 0.2 mg/dL (ref 0.0–1.0)

## 2012-02-05 LAB — PROTIME-INR
INR: 0.99 (ref 0.00–1.49)
Prothrombin Time: 13.3 seconds (ref 11.6–15.2)

## 2012-02-05 LAB — COMPREHENSIVE METABOLIC PANEL
BUN: 15 mg/dL (ref 6–23)
Calcium: 8.6 mg/dL (ref 8.4–10.5)
Creatinine, Ser: 0.75 mg/dL (ref 0.50–1.10)
GFR calc Af Amer: >90 mL/min (ref >90)
GFR calc non Af Amer: 87 mL/min — ABNORMAL LOW (ref >90)
Glucose, Bld: 103 mg/dL — ABNORMAL HIGH (ref 70–99)
Sodium: 139 mEq/L (ref 135–145)
Total Protein: 6.1 g/dL (ref 6.0–8.3)

## 2012-02-05 LAB — CBC
HCT: 28.7 % — ABNORMAL LOW (ref 36.0–46.0)
Hemoglobin: 9.4 g/dL — ABNORMAL LOW (ref 12.0–15.0)
MCH: 30.8 pg (ref 26.0–34.0)
MCHC: 32.8 g/dL (ref 30.0–36.0)
MCV: 94.1 fL (ref 78.0–100.0)

## 2012-02-05 MED ORDER — HYDROCODONE-ACETAMINOPHEN 10-325 MG PO TABS: 1.0000 | ORAL_TABLET | ORAL | Status: DC | PRN

## 2012-02-05 MED ORDER — SODIUM CHLORIDE 0.9 % IV SOLN: 2.0000 mg/h | INTRAVENOUS | Status: DC

## 2012-02-05 MED ORDER — HYDROCODONE-ACETAMINOPHEN 10-325 MG PO TABS
1.0000 | ORAL_TABLET | ORAL | Status: DC | PRN
  Administered 2012-02-05 – 2012-02-06 (×2): 2 via ORAL
  Filled 2012-02-05 (×4): qty 2

## 2012-02-05 MED ORDER — CHLORHEXIDINE GLUCONATE 4 % EX LIQD
60.0000 mL | Freq: Once | CUTANEOUS | Status: DC
  Filled 2012-02-05: qty 60

## 2012-02-05 MED ORDER — SODIUM CHLORIDE 0.9 % IV SOLN: INTRAVENOUS | Status: DC

## 2012-02-05 MED ORDER — CEFAZOLIN SODIUM-DEXTROSE 2-3 GM-% IV SOLR
2.0000 g | Freq: Once | INTRAVENOUS | Status: AC
  Administered 2012-02-06: 2 g via INTRAVENOUS
  Filled 2012-02-05: qty 50

## 2012-02-05 MED ORDER — CHLORHEXIDINE GLUCONATE 4 % EX LIQD
60.0000 mL | Freq: Once | CUTANEOUS | Status: AC
  Administered 2012-02-06: 4 via TOPICAL
  Filled 2012-02-05: qty 60

## 2012-02-05 MED ORDER — DEXTROSE 5 % IV SOLN
2.0000 g | INTRAVENOUS | Status: DC
  Filled 2012-02-05: qty 20

## 2012-02-05 NOTE — H&P (Signed)
Peggy Jennings is an 65 y.o. female.   Chief Complaint: right leg pain  HPI: Peggy Jennings is 2 weeks s/p right total knee revision. Patient had fall post op day 1 and upon x-ray was found to have a non-displaced distal femur fracture. Patient continued rehab at the hospital with toe-touch weight-bearing precautions and ws discharged to [facility] Friday post op day 4. Patient was seen in our office yesterday, 2 weeks out from surgery, x-rays revealed a shift in her fracture of at least 20 degrees. Patient was sent back to [facility]. Peggy Jennings at that time showed the x-ray to the trauma specialist, Peggy Jennings, who decided the fracture needed to be fixed. Patient was direct admitted to [facility] from [facility] February 05, 2012.  Past Medical History  Diagnosis Date  . Fibromyalgia   . History of atrial fibrillation     ablation performed in 2007.  . Hypertension   . Dysrhythmia     Hx of atrial fib. See progress note.   . Hyperlipemia   . Shortness of breath   . Asthma   . GERD (gastroesophageal reflux disease)   . Recurrent upper respiratory infection (URI)   . History of bronchitis   . Urinary tract infection 2 months ago   . Depression   . Blood transfusion   . History of anemia     Past Surgical History  Procedure Date  . Appendectomy 1960  . Abdominal hysterectomy 1977  . Joint replacement 2-3 years    Right Hip  . Joint replacement 3-4 years ago    Left Hip  . Joint replacement 4 years ago     Right Knee  . Joint replacement 10 years ago     Left Knee  . Plantar fascia surgery 7-8 Years     Right Foot  . Finger surgery     left index finger, right thumb, cyst removal  . Total knee revision 01/20/2012    Procedure: TOTAL KNEE REVISION;  Surgeon: Peggy D Lucey, MD;  Location: [facility] OR;  Service: Orthopedics;  Laterality: Right;    Family History  Problem Relation Age of Onset  . Anesthesia problems Neg Hx   . Hypotension Neg Hx   . Malignant hyperthermia Neg Hx   . Pseudochol  deficiency Neg Hx    Social History:  reports that she has never smoked. She has never used smokeless tobacco. She reports that she does not drink alcohol. Her drug history not on file.  Allergies:  Allergies  Allergen Reactions  . Percocet (Oxycodone-Acetaminophen) Other (See Comments)    hallucinations    Medications Prior to Admission  Medication Sig Dispense Refill  . cimetidine (TAGAMET) 200 MG tablet Take 200 mg by mouth at bedtime.      . cloNIDine (CATAPRES) 0.1 MG tablet Take 0.1 mg by mouth 2 (two) times daily.      . diltiazem (DILACOR XR) 240 MG 24 hr capsule Take 240 mg by mouth daily.      . DULoxetine (CYMBALTA) 60 MG capsule Take 60 mg by mouth at bedtime.      . enoxaparin (LOVENOX) 40 MG/0.4ML SOLN Inject 0.4 mLs (40 mg total) into the skin daily.  10 Syringe  0  . estrogens, conjugated, (PREMARIN) 0.45 MG tablet Take 0.45 mg by mouth at bedtime. Take daily for 21 days then do not take for 7 days.      . meloxicam (MOBIC) 7.5 MG tablet Take 7.5 mg by mouth 2 (two)   times daily.      . Milnacipran (SAVELLA) 50 MG TABS Take 50 mg by mouth 2 (two) times daily.      . Multiple Vitamin (MULITIVITAMIN WITH MINERALS) TABS Take 1 tablet by mouth daily.      . omeprazole (PRILOSEC OTC) 20 MG tablet Take 20 mg by mouth daily.      . oxyCODONE (OXYCONTIN) 10 MG 12 hr tablet Take 1 tablet (10 mg total) by mouth every 12 (twelve) hours.  30 tablet  0  . pravastatin (PRAVACHOL) 80 MG tablet Take 80 mg by mouth at bedtime.      . ramipril (ALTACE) 10 MG tablet Take 10 mg by mouth 2 (two) times daily.      . telmisartan (MICARDIS) 80 MG tablet Take 80 mg by mouth daily.      . VITAMIN D, CHOLECALCIFEROL, PO Take 5,000 Units by mouth at bedtime.       No current facility-administered medications on file as of 02/05/2012.    No results found for this or any previous visit (from the past 48 hour(s)). No results found.  Review of Systems  Constitutional: Negative.  Negative for fever  and chills.  HENT: Negative.  Negative for neck pain.   Eyes: Negative.   Respiratory: Negative.   Cardiovascular: Negative.   Gastrointestinal: Negative.   Genitourinary: Negative.   Musculoskeletal: Positive for joint pain. Negative for myalgias, back pain and falls.  Skin: Negative.   Neurological: Negative.   Endo/Heme/Allergies: Negative.   Psychiatric/Behavioral: Negative.     There were no vitals taken for this visit. Physical Exam  Constitutional: She is oriented to person, place, and time. She appears well-developed and well-nourished.  HENT:  Head: Normocephalic.  Neck: Normal range of motion. Neck supple.  Cardiovascular: Normal rate and regular rhythm.   Respiratory: Effort normal and breath sounds normal.  GI: Soft. Bowel sounds are normal.  Musculoskeletal:       Right knee: She exhibits decreased range of motion, swelling, effusion and abnormal alignment.  Neurological: She is alert and oriented to person, place, and time.  Skin: Skin is warm and dry.     Assessment/Plan Right distal femur fracture displaced.  1. Admit to orthoapedics 5000. 2. Pain control. 3. NPO at midnight. 4. ORIF by Peggy Jennings 02/06/12  Peggy Jennings 02/05/2012, 1:40 PM    

## 2012-02-06 ENCOUNTER — Encounter (HOSPITAL_COMMUNITY)
Admission: AD | Disposition: A | Discharge status: Skilled Nursing Facility | Payer: Self-pay | Source: Ambulatory Visit | Attending: Orthopedic Surgery

## 2012-02-06 ENCOUNTER — Encounter (HOSPITAL_COMMUNITY): Payer: Self-pay | Admitting: Anesthesiology

## 2012-02-06 ENCOUNTER — Inpatient Hospital Stay (HOSPITAL_COMMUNITY): Payer: Medicare Other

## 2012-02-06 ENCOUNTER — Encounter (HOSPITAL_COMMUNITY): Payer: Self-pay | Admitting: *Deleted

## 2012-02-06 ENCOUNTER — Ambulatory Visit (HOSPITAL_COMMUNITY): Admission: RE | Admit: 2012-02-06 | Payer: Medicare Other | Source: Ambulatory Visit | Admitting: Orthopedic Surgery

## 2012-02-06 ENCOUNTER — Inpatient Hospital Stay (HOSPITAL_COMMUNITY): Payer: Medicare Other | Admitting: Anesthesiology

## 2012-02-06 HISTORY — PX: ORIF FEMUR FRACTURE: SHX2119

## 2012-02-06 LAB — CBC
HCT: 38.3 % (ref 36.0–46.0)
Hemoglobin: 13 g/dL (ref 12.0–15.0)
RBC: 4.25 MIL/uL (ref 3.87–5.11)
WBC: 13.9 10*3/uL — ABNORMAL HIGH (ref 4.0–10.5)

## 2012-02-06 LAB — URINE CULTURE: Culture: NO GROWTH

## 2012-02-06 SURGERY — OPEN REDUCTION INTERNAL FIXATION (ORIF) DISTAL FEMUR FRACTURE
Anesthesia: General | Site: Leg Upper | Laterality: Right | Wound class: Clean

## 2012-02-06 MED ORDER — SODIUM CHLORIDE 0.9 % IV SOLN
200.0000 ug | INTRAVENOUS | Status: DC | PRN
  Administered 2012-02-06: .5 ug/kg/h via INTRAVENOUS

## 2012-02-06 MED ORDER — NALOXONE HCL 0.4 MG/ML IJ SOLN: 0.4000 mg | INTRAMUSCULAR | Status: DC | PRN

## 2012-02-06 MED ORDER — DULOXETINE HCL 60 MG PO CPEP
60.0000 mg | ORAL_CAPSULE | Freq: Every day | ORAL | Status: DC
  Administered 2012-02-06 – 2012-02-09 (×4): 60 mg via ORAL
  Filled 2012-02-06 (×5): qty 1

## 2012-02-06 MED ORDER — NEOSTIGMINE METHYLSULFATE 1 MG/ML IJ SOLN
INTRAMUSCULAR | Status: DC | PRN
  Administered 2012-02-06: 4 mg via INTRAVENOUS

## 2012-02-06 MED ORDER — 0.9 % SODIUM CHLORIDE (POUR BTL) OPTIME
TOPICAL | Status: DC | PRN
  Administered 2012-02-06: 1000 mL

## 2012-02-06 MED ORDER — METHOCARBAMOL 100 MG/ML IJ SOLN
500.0000 mg | Freq: Four times a day (QID) | INTRAVENOUS | Status: DC | PRN
  Filled 2012-02-06: qty 5

## 2012-02-06 MED ORDER — CEFAZOLIN SODIUM 1-5 GM-% IV SOLN
1.0000 g | Freq: Four times a day (QID) | INTRAVENOUS | Status: AC
  Administered 2012-02-06 – 2012-02-07 (×3): 1 g via INTRAVENOUS
  Filled 2012-02-06 (×3): qty 50

## 2012-02-06 MED ORDER — DOCUSATE SODIUM 100 MG PO CAPS
100.0000 mg | ORAL_CAPSULE | Freq: Two times a day (BID) | ORAL | Status: DC
  Administered 2012-02-06 – 2012-02-10 (×8): 100 mg via ORAL
  Filled 2012-02-06 (×8): qty 1

## 2012-02-06 MED ORDER — MORPHINE SULFATE (PF) 1 MG/ML IV SOLN
INTRAVENOUS | Status: AC
  Filled 2012-02-06: qty 25

## 2012-02-06 MED ORDER — GLYCOPYRROLATE 0.2 MG/ML IJ SOLN
INTRAMUSCULAR | Status: DC | PRN
  Administered 2012-02-06: .7 mg via INTRAVENOUS

## 2012-02-06 MED ORDER — METOCLOPRAMIDE HCL 10 MG PO TABS: 5.0000 mg | ORAL_TABLET | Freq: Three times a day (TID) | ORAL | Status: DC | PRN

## 2012-02-06 MED ORDER — ENOXAPARIN SODIUM 40 MG/0.4ML ~~LOC~~ SOLN
40.0000 mg | Freq: Every day | SUBCUTANEOUS | Status: DC
  Administered 2012-02-07 – 2012-02-09 (×3): 40 mg via SUBCUTANEOUS
  Filled 2012-02-06 (×5): qty 0.4

## 2012-02-06 MED ORDER — FENTANYL CITRATE 0.05 MG/ML IJ SOLN
INTRAMUSCULAR | Status: DC | PRN
  Administered 2012-02-06 (×2): 50 ug via INTRAVENOUS
  Administered 2012-02-06: 100 ug via INTRAVENOUS
  Administered 2012-02-06 (×3): 50 ug via INTRAVENOUS
  Administered 2012-02-06: 150 ug via INTRAVENOUS
  Administered 2012-02-06 (×3): 50 ug via INTRAVENOUS

## 2012-02-06 MED ORDER — POTASSIUM CHLORIDE IN NACL 20-0.9 MEQ/L-% IV SOLN
INTRAVENOUS | Status: DC
  Administered 2012-02-06 – 2012-02-09 (×5): via INTRAVENOUS
  Filled 2012-02-06 (×6): qty 1000

## 2012-02-06 MED ORDER — ONDANSETRON HCL 4 MG/2ML IJ SOLN: 4.0000 mg | Freq: Four times a day (QID) | INTRAMUSCULAR | Status: DC | PRN

## 2012-02-06 MED ORDER — DIPHENHYDRAMINE HCL 50 MG/ML IJ SOLN: 12.5000 mg | Freq: Four times a day (QID) | INTRAMUSCULAR | Status: DC | PRN

## 2012-02-06 MED ORDER — ROCURONIUM BROMIDE 100 MG/10ML IV SOLN
INTRAVENOUS | Status: DC | PRN
  Administered 2012-02-06: 50 mg via INTRAVENOUS

## 2012-02-06 MED ORDER — VITAMIN D3 25 MCG (1000 UNIT) PO TABS
2000.0000 [IU] | ORAL_TABLET | Freq: Two times a day (BID) | ORAL | Status: DC
  Administered 2012-02-06 – 2012-02-10 (×9): 2000 [IU] via ORAL
  Filled 2012-02-06 (×10): qty 2

## 2012-02-06 MED ORDER — POLYETHYLENE GLYCOL 3350 17 G PO PACK
17.0000 g | PACK | Freq: Every day | ORAL | Status: DC
  Administered 2012-02-06 – 2012-02-10 (×3): 17 g via ORAL
  Filled 2012-02-06 (×5): qty 1

## 2012-02-06 MED ORDER — SODIUM CHLORIDE 0.9 % IJ SOLN: 9.0000 mL | INTRAMUSCULAR | Status: DC | PRN

## 2012-02-06 MED ORDER — ONDANSETRON HCL 4 MG PO TABS: 4.0000 mg | ORAL_TABLET | Freq: Four times a day (QID) | ORAL | Status: DC | PRN

## 2012-02-06 MED ORDER — MAGNESIUM CITRATE PO SOLN
1.0000 | Freq: Once | ORAL | Status: AC | PRN
  Filled 2012-02-06: qty 296

## 2012-02-06 MED ORDER — PANTOPRAZOLE SODIUM 40 MG PO TBEC
40.0000 mg | DELAYED_RELEASE_TABLET | Freq: Every day | ORAL | Status: DC
  Administered 2012-02-06 – 2012-02-10 (×5): 40 mg via ORAL
  Filled 2012-02-06 (×5): qty 1

## 2012-02-06 MED ORDER — LACTATED RINGERS IV SOLN
INTRAVENOUS | Status: DC | PRN
  Administered 2012-02-06: 07:00:00 via INTRAVENOUS

## 2012-02-06 MED ORDER — ONDANSETRON HCL 4 MG/2ML IJ SOLN: 4.0000 mg | Freq: Once | INTRAMUSCULAR | Status: DC | PRN

## 2012-02-06 MED ORDER — ADULT MULTIVITAMIN W/MINERALS CH
1.0000 | ORAL_TABLET | Freq: Every day | ORAL | Status: DC
  Administered 2012-02-06 – 2012-02-10 (×5): 1 via ORAL
  Filled 2012-02-06 (×5): qty 1

## 2012-02-06 MED ORDER — METHOCARBAMOL 500 MG PO TABS
500.0000 mg | ORAL_TABLET | Freq: Four times a day (QID) | ORAL | Status: DC | PRN
  Administered 2012-02-10: 500 mg via ORAL
  Filled 2012-02-06 (×2): qty 1

## 2012-02-06 MED ORDER — ONDANSETRON HCL 4 MG/2ML IJ SOLN
INTRAMUSCULAR | Status: DC | PRN
  Administered 2012-02-06: 4 mg via INTRAVENOUS

## 2012-02-06 MED ORDER — METOCLOPRAMIDE HCL 5 MG/ML IJ SOLN
5.0000 mg | Freq: Three times a day (TID) | INTRAMUSCULAR | Status: DC | PRN
  Filled 2012-02-06: qty 2

## 2012-02-06 MED ORDER — HYDROMORPHONE HCL PF 1 MG/ML IJ SOLN
0.2500 mg | INTRAMUSCULAR | Status: DC | PRN
  Administered 2012-02-06 (×2): 0.5 mg via INTRAVENOUS

## 2012-02-06 MED ORDER — SODIUM CHLORIDE 0.9 % IV SOLN
INTRAVENOUS | Status: DC | PRN
  Administered 2012-02-06: 11:00:00 via INTRAVENOUS

## 2012-02-06 MED ORDER — DIPHENHYDRAMINE HCL 12.5 MG/5ML PO ELIX
12.5000 mg | ORAL_SOLUTION | Freq: Four times a day (QID) | ORAL | Status: DC | PRN
  Filled 2012-02-06: qty 5

## 2012-02-06 MED ORDER — ESTROGENS CONJUGATED 0.45 MG PO TABS
0.4500 mg | ORAL_TABLET | Freq: Every day | ORAL | Status: DC
  Administered 2012-02-06 – 2012-02-09 (×4): 0.45 mg via ORAL
  Filled 2012-02-06 (×5): qty 1

## 2012-02-06 MED ORDER — BISACODYL 10 MG RE SUPP: 10.0000 mg | Freq: Every day | RECTAL | Status: DC | PRN

## 2012-02-06 MED ORDER — DILTIAZEM HCL ER 240 MG PO CP24
240.0000 mg | ORAL_CAPSULE | Freq: Every day | ORAL | Status: DC
  Administered 2012-02-06 – 2012-02-10 (×5): 240 mg via ORAL
  Filled 2012-02-06 (×5): qty 1

## 2012-02-06 MED ORDER — PHENYLEPHRINE HCL 10 MG/ML IJ SOLN
INTRAMUSCULAR | Status: DC | PRN
  Administered 2012-02-06: 40 ug via INTRAVENOUS
  Administered 2012-02-06: 80 ug via INTRAVENOUS
  Administered 2012-02-06: 40 ug via INTRAVENOUS
  Administered 2012-02-06: 80 ug via INTRAVENOUS

## 2012-02-06 MED ORDER — MIDAZOLAM HCL 5 MG/5ML IJ SOLN
INTRAMUSCULAR | Status: DC | PRN
  Administered 2012-02-06: 2 mg via INTRAVENOUS

## 2012-02-06 MED ORDER — MORPHINE SULFATE (PF) 1 MG/ML IV SOLN
INTRAVENOUS | Status: AC
  Administered 2012-02-06: 20:00:00
  Filled 2012-02-06: qty 25

## 2012-02-06 MED ORDER — OXYCODONE HCL 5 MG PO TABS: 5.0000 mg | ORAL_TABLET | ORAL | Status: DC | PRN

## 2012-02-06 MED ORDER — OMEPRAZOLE MAGNESIUM 20 MG PO TBEC
20.0000 mg | DELAYED_RELEASE_TABLET | Freq: Every day | ORAL | Status: DC
Start: 2012-02-06 — End: 2012-02-06

## 2012-02-06 MED ORDER — PROPOFOL 10 MG/ML IV EMUL
INTRAVENOUS | Status: DC | PRN
  Administered 2012-02-06: 110 mg via INTRAVENOUS

## 2012-02-06 MED ORDER — SODIUM CHLORIDE 0.9 % IV SOLN
0.4000 ug/kg/h | INTRAVENOUS | Status: DC
  Filled 2012-02-06: qty 2

## 2012-02-06 MED ORDER — ACETAMINOPHEN 10 MG/ML IV SOLN: 1000.0000 mg | Freq: Once | INTRAVENOUS | Status: DC | PRN

## 2012-02-06 MED ORDER — MORPHINE SULFATE 10 MG/ML IJ SOLN
INTRAMUSCULAR | Status: DC | PRN
  Administered 2012-02-06: 10 mg via INTRAVENOUS

## 2012-02-06 MED ORDER — MORPHINE SULFATE (PF) 1 MG/ML IV SOLN
INTRAVENOUS | Status: DC
  Administered 2012-02-06: 6.4 mg via INTRAVENOUS
  Administered 2012-02-06: 14:00:00 via INTRAVENOUS
  Administered 2012-02-06: 12.62 mg via INTRAVENOUS
  Administered 2012-02-07 (×2): 3 mg via INTRAVENOUS
  Administered 2012-02-07: 18 mg via INTRAVENOUS
  Administered 2012-02-07: 10 mg via INTRAVENOUS
  Administered 2012-02-07: 12 mg via INTRAVENOUS
  Administered 2012-02-08: 4 mg via INTRAVENOUS
  Administered 2012-02-08: 1 mg via INTRAVENOUS
  Administered 2012-02-08: 0.694 mg via INTRAVENOUS
  Administered 2012-02-08: 1 mg via INTRAVENOUS

## 2012-02-06 SURGICAL SUPPLY — 68 items
18 holde right distal femur NCB plate ×2 IMPLANT
4.0x26mm NCB screw ×2 IMPLANT
4.0x32mm NCB screw ×4 IMPLANT
4.0x36mm NCB screw ×4 IMPLANT
4.0x40mm NCB screw ×2 IMPLANT
5.0x14mm uni cortical screw ×4 IMPLANT
5.0x75mm NCB screw ×2 IMPLANT
5.0x85mm NCB screw ×2 IMPLANT
BANDAGE ELASTIC 4 VELCRO ST LF (GAUZE/BANDAGES/DRESSINGS) ×2 IMPLANT
BANDAGE ELASTIC 6 VELCRO ST LF (GAUZE/BANDAGES/DRESSINGS) ×2 IMPLANT
BANDAGE GAUZE ELAST BULKY 4 IN (GAUZE/BANDAGES/DRESSINGS) IMPLANT
BIT DRILL 4.3 (BIT) ×1 IMPLANT
BIT DRILL QC 3.3X195 (BIT) ×2 IMPLANT
BLADE SURG ROTATE 9660 (MISCELLANEOUS) IMPLANT
BRUSH SCRUB DISP (MISCELLANEOUS) ×4 IMPLANT
CABLE (Orthopedic Implant) ×4 IMPLANT
CLOTH BEACON ORANGE TIMEOUT ST (SAFETY) ×2 IMPLANT
DRAPE C-ARM 42X72 X-RAY (DRAPES) ×2 IMPLANT
DRAPE C-ARMOR (DRAPES) ×2 IMPLANT
DRAPE ORTHO SPLIT 77X108 STRL (DRAPES) ×2
DRAPE SURG ORHT 6 SPLT 77X108 (DRAPES) ×2 IMPLANT
DRAPE U-SHAPE 47X51 STRL (DRAPES) ×2 IMPLANT
DRILL BIT 4.3 (BIT) ×1
DRSG ADAPTIC 3X8 NADH LF (GAUZE/BANDAGES/DRESSINGS) IMPLANT
DRSG MEPILEX BORDER 4X12 (GAUZE/BANDAGES/DRESSINGS) ×2 IMPLANT
DRSG MEPILEX BORDER 4X8 (GAUZE/BANDAGES/DRESSINGS) ×2 IMPLANT
DRSG PAD ABDOMINAL 8X10 ST (GAUZE/BANDAGES/DRESSINGS) IMPLANT
ELECT REM PT RETURN 9FT ADLT (ELECTROSURGICAL) ×2
ELECTRODE REM PT RTRN 9FT ADLT (ELECTROSURGICAL) ×1 IMPLANT
EVACUATOR 1/8 PVC DRAIN (DRAIN) IMPLANT
EVACUATOR 3/16  PVC DRAIN (DRAIN)
EVACUATOR 3/16 PVC DRAIN (DRAIN) IMPLANT
GLOVE BIO SURGEON STRL SZ7.5 (GLOVE) ×2 IMPLANT
GLOVE BIO SURGEON STRL SZ8 (GLOVE) ×2 IMPLANT
GLOVE BIOGEL PI IND STRL 7.5 (GLOVE) ×1 IMPLANT
GLOVE BIOGEL PI IND STRL 8 (GLOVE) ×1 IMPLANT
GLOVE BIOGEL PI INDICATOR 7.5 (GLOVE) ×1
GLOVE BIOGEL PI INDICATOR 8 (GLOVE) ×1
GOWN PREVENTION PLUS XLARGE (GOWN DISPOSABLE) ×2 IMPLANT
GOWN STRL NON-REIN LRG LVL3 (GOWN DISPOSABLE) ×4 IMPLANT
KIT BASIN OR (CUSTOM PROCEDURE TRAY) ×2 IMPLANT
KIT ROOM TURNOVER OR (KITS) ×2 IMPLANT
MANIFOLD NEPTUNE II (INSTRUMENTS) ×2 IMPLANT
NCB locking cap ×20 IMPLANT
NEEDLE 22X1 1/2 (OR ONLY) (NEEDLE) IMPLANT
NS IRRIG 1000ML POUR BTL (IV SOLUTION) ×2 IMPLANT
PACK TOTAL JOINT (CUSTOM PROCEDURE TRAY) ×2 IMPLANT
PAD ARMBOARD 7.5X6 YLW CONV (MISCELLANEOUS) ×4 IMPLANT
PAD CAST 4YDX4 CTTN HI CHSV (CAST SUPPLIES) IMPLANT
PADDING CAST COTTON 4X4 STRL (CAST SUPPLIES)
PADDING CAST COTTON 6X4 STRL (CAST SUPPLIES) IMPLANT
SPONGE GAUZE 4X4 12PLY (GAUZE/BANDAGES/DRESSINGS) IMPLANT
SPONGE LAP 18X18 X RAY DECT (DISPOSABLE) IMPLANT
STAPLER VISISTAT 35W (STAPLE) ×2 IMPLANT
SUCTION FRAZIER TIP 10 FR DISP (SUCTIONS) ×2 IMPLANT
SUT ETHILON 3 0 PS 1 (SUTURE) ×6 IMPLANT
SUT PROLENE 0 CT 2 (SUTURE) IMPLANT
SUT VIC AB 0 CT1 27 (SUTURE) ×2
SUT VIC AB 0 CT1 27XBRD ANBCTR (SUTURE) ×2 IMPLANT
SUT VIC AB 1 CT1 27 (SUTURE) ×2
SUT VIC AB 1 CT1 27XBRD ANBCTR (SUTURE) ×2 IMPLANT
SUT VIC AB 2-0 CT1 27 (SUTURE) ×2
SUT VIC AB 2-0 CT1 TAPERPNT 27 (SUTURE) ×2 IMPLANT
SYR 20ML ECCENTRIC (SYRINGE) IMPLANT
TOWEL OR 17X24 6PK STRL BLUE (TOWEL DISPOSABLE) ×2 IMPLANT
TOWEL OR 17X26 10 PK STRL BLUE (TOWEL DISPOSABLE) ×4 IMPLANT
TRAY FOLEY CATH 14FR (SET/KITS/TRAYS/PACK) IMPLANT
WATER STERILE IRR 1000ML POUR (IV SOLUTION) ×4 IMPLANT

## 2012-02-06 NOTE — Anesthesia Preprocedure Evaluation (Addendum)
Anesthesia Evaluation  Patient identified by MRN, date of birth, ID band Patient awake    Reviewed: Allergy & Precautions, H&P , NPO status , Patient's Chart, lab work & pertinent test results  Airway Mallampati: II TM Distance: >3 FB Neck ROM: Full    Dental  (+) Teeth Intact and Dental Advisory Given   Pulmonary shortness of breath and with exertion, asthma ,  Pt states occ asthma. Smoke is a trigger. Uses inhaler twice per year breath sounds clear to auscultation        Cardiovascular hypertension, Pt. on medications - dysrhythmias Rhythm:Regular Rate:Normal     Neuro/Psych    GI/Hepatic GERD-  Medicated,  Endo/Other    Renal/GU      Musculoskeletal  (+) Fibromyalgia -, narcotic dependent  Abdominal   Peds  Hematology   Anesthesia Other Findings   Reproductive/Obstetrics                        Anesthesia Physical Anesthesia Plan  ASA: III  Anesthesia Plan: General   Post-op Pain Management:    Induction: Intravenous  Airway Management Planned: Oral ETT  Additional Equipment:   Intra-op Plan:   Post-operative Plan: Extubation in OR  Informed Consent: I have reviewed the patients History and Physical, chart, labs and discussed the procedure including the risks, benefits and alternatives for the proposed anesthesia with the patient or authorized representative who has indicated his/her understanding and acceptance.   Dental advisory given  Plan Discussed with: CRNA and Surgeon  Anesthesia Plan Comments: (Plan GA)        Anesthesia Quick Evaluation

## 2012-02-06 NOTE — Brief Op Note (Signed)
02/05/2012 - 02/06/2012  11:33 AM  PATIENT:  Peggy Jennings  66 y.o. female  PRE-OPERATIVE DIAGNOSIS:  right distal femur peirprosthetic fracture   POST-OPERATIVE DIAGNOSIS:  right distal femur periprosthetic fracture  PROCEDURE:  Procedure(s) (LRB): OPEN REDUCTION INTERNAL FIXATION (ORIF) DISTAL FEMUR FRACTURE (Right)  SURGEON:  Surgeon(s) and Role:    * Budd Palmer, MD - Primary  PHYSICIAN ASSISTANT: Montez Morita, [facility]  ANESTHESIA:   general  EBL:  Total I/O In: 2700 [I.V.:2000; Blood:700] Out: 1150 [Urine:650; Blood:500]  BLOOD ADMINISTERED:700 CC PRBC  DRAINS: none   LOCAL MEDICATIONS USED:  NONE  SPECIMEN:  No Specimen  DISPOSITION OF SPECIMEN:  N/A  COUNTS:  YES  TOURNIQUET:  * No tourniquets in log *  DICTATION: .Other Dictation: Dictation Number [id number]  PLAN OF CARE: Admit to inpatient   PATIENT DISPOSITION:  PACU - hemodynamically stable.   Delay start of Pharmacological VTE agent (>24hrs) due to surgical blood loss or risk of bleeding: no

## 2012-02-06 NOTE — Progress Notes (Signed)
PT Cancellation Note  Orders for PT eval and treat received, however treatment cancelled today due day of surgery and patient having right LE pain. Will follow up tomorrow.  Thanks!  Romeo Rabon 02/06/2012, 5:11 PM

## 2012-02-06 NOTE — Clinical Documentation Improvement (Signed)
Anemia Blood Loss Clarification  THIS DOCUMENT IS NOT A PERMANENT PART OF THE MEDICAL RECORD  RESPOND TO THE THIS QUERY, FOLLOW THE INSTRUCTIONS BELOW:  1. If needed, update documentation for the patient's encounter via the notes activity.  2. Access this query again and click edit on the In [employer].  3. After updating, or not, click F2 to complete all highlighted (required) fields concerning your review. Select "additional documentation in the medical record" OR "no additional documentation provided".  4. Click Sign note button.  5. The deficiency will fall out of your In Basket *Please let us know if you are not able to complete this workflow by phone or e-mail (listed below).        02/06/12  Dear Dr. Myrene Galas / Associates  In an effort to better capture your patient's severity of illness, reflect appropriate length of stay and utilization of resources, a review of the patient medical record has revealed the following indicators.    Based on your clinical judgment, please clarify and document in a progress note and/or discharge summary the clinical condition associated with the following supporting information:  In responding to this query please exercise your independent judgment.  The fact that a query is asked, does not imply that any particular answer is desired or expected.  Possible Clinical Conditions?   " Expected Acute Blood Loss Anemia  " Acute Blood Loss Anemia " Acute on chronic blood loss anemia  " Chronic blood loss anemia  " Precipitous drop in Hematocrit  " Other Condition________________  " Cannot Clinically Determine    Risk Factors: (recent surgery,  EBL in OR :500]   Supporting Information:  Signs and Symptoms   Diagnostics: Component     Latest Ref Rng 02/05/2012 02/05/2012 02/05/2012 02/05/2012 02/05/2012  HGB     12.0 - 15.0 g/dL    9.4 (L)   HCT     04.5 - 46.0 %    28.7 (L)    Component     Latest Ref Rng 02/05/2012 02/05/2012  02/05/2012 02/05/2012 02/06/2012  HGB     12.0 - 15.0 g/dL     40.9  HCT     81.1 - 46.0 %     38.3   Treatments:  Transfusion:700 CC PRBC in OR  Reviewed: Montez Morita PA answered query in 02/10/12  d/c summary.    Thank You,  Shelda Pal RN, BSN, CCM   Clinical Documentation Specialist:  Pager [phone]  Health Information Management [place]

## 2012-02-06 NOTE — Interval H&P Note (Signed)
History and Physical Interval Note:  02/06/2012 11:32 AM  Peggy Jennings  has presented today for surgery, with the diagnosis of right distal femur peirprosthetic fracture   The various methods of treatment have been discussed with the patient and family. After consideration of risks, benefits and other options for treatment, the patient has consented to  Procedure(s) (LRB): OPEN REDUCTION INTERNAL FIXATION (ORIF) DISTAL FEMUR FRACTURE (Right) as a surgical intervention .  The patients' history has been reviewed, patient examined, no change in status, stable for surgery.  I have reviewed the patients' chart and labs.  Questions were answered to the patient's satisfaction.     Durrell Barajas H

## 2012-02-06 NOTE — H&P (View-Only) (Signed)
Peggy Jennings is an 66 y.o. female.   Chief Complaint: right leg pain  HPI: Peggy Jennings is 2 weeks s/p right total knee revision. Patient had fall post op day 1 and upon x-ray was found to have a non-displaced distal femur fracture. Patient continued rehab at the hospital with toe-touch weight-bearing precautions and ws discharged to [facility] Friday post op day 4. Patient was seen in our office yesterday, 2 weeks out from surgery, x-rays revealed a shift in her fracture of at least 20 degrees. Patient was sent back to [facility]. Dr Sherlean Foot at that time showed the x-ray to the trauma specialist, Dr. Carola Frost, who decided the fracture needed to be fixed. Patient was direct admitted to [facility] from [facility] February 05, 2012.  Past Medical History  Diagnosis Date  . Fibromyalgia   . History of atrial fibrillation     ablation performed in 2007.  Marland Kitchen Hypertension   . Dysrhythmia     Hx of atrial fib. See progress note.   . Hyperlipemia   . Shortness of breath   . Asthma   . GERD (gastroesophageal reflux disease)   . Recurrent upper respiratory infection (URI)   . History of bronchitis   . Urinary tract infection 2 months ago   . Depression   . Blood transfusion   . History of anemia     Past Surgical History  Procedure Date  . Appendectomy 1960  . Abdominal hysterectomy 1977  . Joint replacement 2-3 years    Right Hip  . Joint replacement 3-4 years ago    Left Hip  . Joint replacement 4 years ago     Right Knee  . Joint replacement 10 years ago     Left Knee  . Plantar fascia surgery 7-8 Years     Right Foot  . Finger surgery     left index finger, right thumb, cyst removal  . Total knee revision 01/20/2012    Procedure: TOTAL KNEE REVISION;  Surgeon: Raymon Mutton, MD;  Location: [facility] OR;  Service: Orthopedics;  Laterality: Right;    Family History  Problem Relation Age of Onset  . Anesthesia problems Neg Hx   . Hypotension Neg Hx   . Malignant hyperthermia Neg Hx   . Pseudochol  deficiency Neg Hx    Social History:  reports that she has never smoked. She has never used smokeless tobacco. She reports that she does not drink alcohol. Her drug history not on file.  Allergies:  Allergies  Allergen Reactions  . Percocet (Oxycodone-Acetaminophen) Other (See Comments)    hallucinations    Medications Prior to Admission  Medication Sig Dispense Refill  . cimetidine (TAGAMET) 200 MG tablet Take 200 mg by mouth at bedtime.      . cloNIDine (CATAPRES) 0.1 MG tablet Take 0.1 mg by mouth 2 (two) times daily.      Marland Kitchen diltiazem (DILACOR XR) 240 MG 24 hr capsule Take 240 mg by mouth daily.      . DULoxetine (CYMBALTA) 60 MG capsule Take 60 mg by mouth at bedtime.      . enoxaparin (LOVENOX) 40 MG/0.4ML SOLN Inject 0.4 mLs (40 mg total) into the skin daily.  10 Syringe  0  . estrogens, conjugated, (PREMARIN) 0.45 MG tablet Take 0.45 mg by mouth at bedtime. Take daily for 21 days then do not take for 7 days.      . meloxicam (MOBIC) 7.5 MG tablet Take 7.5 mg by mouth 2 (two)  times daily.      . Milnacipran (SAVELLA) 50 MG TABS Take 50 mg by mouth 2 (two) times daily.      . Multiple Vitamin (MULITIVITAMIN WITH MINERALS) TABS Take 1 tablet by mouth daily.      Marland Kitchen omeprazole (PRILOSEC OTC) 20 MG tablet Take 20 mg by mouth daily.      Marland Kitchen oxyCODONE (OXYCONTIN) 10 MG 12 hr tablet Take 1 tablet (10 mg total) by mouth every 12 (twelve) hours.  30 tablet  0  . pravastatin (PRAVACHOL) 80 MG tablet Take 80 mg by mouth at bedtime.      . ramipril (ALTACE) 10 MG tablet Take 10 mg by mouth 2 (two) times daily.      Marland Kitchen telmisartan (MICARDIS) 80 MG tablet Take 80 mg by mouth daily.      Marland Kitchen VITAMIN D, CHOLECALCIFEROL, PO Take 5,000 Units by mouth at bedtime.       No current facility-administered medications on file as of 02/05/2012.    No results found for this or any previous visit (from the past 48 hour(s)). No results found.  Review of Systems  Constitutional: Negative.  Negative for fever  and chills.  HENT: Negative.  Negative for neck pain.   Eyes: Negative.   Respiratory: Negative.   Cardiovascular: Negative.   Gastrointestinal: Negative.   Genitourinary: Negative.   Musculoskeletal: Positive for joint pain. Negative for myalgias, back pain and falls.  Skin: Negative.   Neurological: Negative.   Endo/Heme/Allergies: Negative.   Psychiatric/Behavioral: Negative.     There were no vitals taken for this visit. Physical Exam  Constitutional: She is oriented to person, place, and time. She appears well-developed and well-nourished.  HENT:  Head: Normocephalic.  Neck: Normal range of motion. Neck supple.  Cardiovascular: Normal rate and regular rhythm.   Respiratory: Effort normal and breath sounds normal.  GI: Soft. Bowel sounds are normal.  Musculoskeletal:       Right knee: She exhibits decreased range of motion, swelling, effusion and abnormal alignment.  Neurological: She is alert and oriented to person, place, and time.  Skin: Skin is warm and dry.     Assessment/Plan Right distal femur fracture displaced.  1. Admit to orthoapedics 5000. 2. Pain control. 3. NPO at midnight. 4. ORIF by Dr. Carola Frost 02/06/12  Peggy Jennings 02/05/2012, 1:40 PM

## 2012-02-06 NOTE — Transfer of Care (Signed)
Immediate Anesthesia Transfer of Care Note  Patient: Peggy Jennings  Procedure(s) Performed: Procedure(s) (LRB): OPEN REDUCTION INTERNAL FIXATION (ORIF) DISTAL FEMUR FRACTURE (Right)  Patient Location: PACU  Anesthesia Type: General  Level of Consciousness: awake, alert , oriented and patient cooperative  Airway & Oxygen Therapy: Patient Spontanous Breathing and Patient connected to face mask oxygen  Post-op Assessment: Report given to PACU RN, Post -op Vital signs reviewed and stable and Patient moving all extremities X 4  Post vital signs: Reviewed and stable  Complications: No apparent anesthesia complications

## 2012-02-07 DIAGNOSIS — Z96659 Presence of unspecified artificial knee joint: Secondary | ICD-10-CM

## 2012-02-07 DIAGNOSIS — M979XXA Periprosthetic fracture around unspecified internal prosthetic joint, initial encounter: Secondary | ICD-10-CM | POA: Insufficient documentation

## 2012-02-07 DIAGNOSIS — M9711XA Periprosthetic fracture around internal prosthetic right knee joint, initial encounter: Secondary | ICD-10-CM

## 2012-02-07 DIAGNOSIS — I10 Essential (primary) hypertension: Secondary | ICD-10-CM

## 2012-02-07 HISTORY — DX: Periprosthetic fracture around internal prosthetic right knee joint, initial encounter: M97.11XA

## 2012-02-07 HISTORY — DX: Periprosthetic fracture around unspecified internal prosthetic joint, initial encounter: M97.9XXA

## 2012-02-07 HISTORY — DX: Essential (primary) hypertension: I10

## 2012-02-07 HISTORY — DX: Presence of unspecified artificial knee joint: Z96.659

## 2012-02-07 LAB — COMPREHENSIVE METABOLIC PANEL
AST: 22 U/L (ref 0–37)
Albumin: 2.5 g/dL — ABNORMAL LOW (ref 3.5–5.2)
BUN: 6 mg/dL (ref 6–23)
Chloride: 103 mEq/L (ref 96–112)
Creatinine, Ser: 0.48 mg/dL — ABNORMAL LOW (ref 0.50–1.10)
Potassium: 3.8 mEq/L (ref 3.5–5.1)
Total Bilirubin: 0.5 mg/dL (ref 0.3–1.2)
Total Protein: 5.4 g/dL — ABNORMAL LOW (ref 6.0–8.3)

## 2012-02-07 LAB — CBC
HCT: 31.7 % — ABNORMAL LOW (ref 36.0–46.0)
MCHC: 33.4 g/dL (ref 30.0–36.0)
MCV: 89.8 fL (ref 78.0–100.0)
Platelets: 275 10*3/uL (ref 150–400)
RDW: 15.4 % (ref 11.5–15.5)
WBC: 8.6 10*3/uL (ref 4.0–10.5)

## 2012-02-07 LAB — TYPE AND SCREEN: Antibody Screen: NEGATIVE

## 2012-02-07 LAB — VITAMIN D 25 HYDROXY (VIT D DEFICIENCY, FRACTURES): Vit D, 25-Hydroxy: 43 ng/mL (ref 30–89)

## 2012-02-07 MED ORDER — SODIUM CHLORIDE 0.9 % IJ SOLN
10.0000 mL | INTRAMUSCULAR | Status: DC | PRN
  Administered 2012-02-08: 10 mL

## 2012-02-07 MED ORDER — RAMIPRIL 10 MG PO CAPS
10.0000 mg | ORAL_CAPSULE | Freq: Two times a day (BID) | ORAL | Status: DC
  Administered 2012-02-07 – 2012-02-10 (×7): 10 mg via ORAL
  Filled 2012-02-07 (×8): qty 1

## 2012-02-07 MED ORDER — IRBESARTAN 300 MG PO TABS
300.0000 mg | ORAL_TABLET | Freq: Every day | ORAL | Status: DC
  Administered 2012-02-07 – 2012-02-10 (×4): 300 mg via ORAL
  Filled 2012-02-07 (×4): qty 1

## 2012-02-07 MED ORDER — MORPHINE SULFATE (PF) 1 MG/ML IV SOLN
INTRAVENOUS | Status: AC
  Administered 2012-02-07: 07:00:00
  Filled 2012-02-07: qty 25

## 2012-02-07 MED ORDER — ACETAMINOPHEN 10 MG/ML IV SOLN
1000.0000 mg | Freq: Four times a day (QID) | INTRAVENOUS | Status: AC
  Administered 2012-02-07 – 2012-02-08 (×3): 1000 mg via INTRAVENOUS
  Filled 2012-02-07 (×4): qty 100

## 2012-02-07 MED ORDER — HYDRALAZINE HCL 20 MG/ML IJ SOLN
10.0000 mg | Freq: Four times a day (QID) | INTRAMUSCULAR | Status: DC | PRN
  Filled 2012-02-07: qty 0.5

## 2012-02-07 MED ORDER — CLONIDINE HCL 0.1 MG PO TABS
0.1000 mg | ORAL_TABLET | Freq: Two times a day (BID) | ORAL | Status: DC
  Administered 2012-02-07 – 2012-02-10 (×7): 0.1 mg via ORAL
  Filled 2012-02-07 (×8): qty 1

## 2012-02-07 MED ORDER — HYDROCODONE-ACETAMINOPHEN 10-325 MG PO TABS
1.0000 | ORAL_TABLET | Freq: Four times a day (QID) | ORAL | Status: DC | PRN
  Administered 2012-02-08: 2 via ORAL
  Administered 2012-02-10: 1 via ORAL
  Filled 2012-02-07: qty 1
  Filled 2012-02-07: qty 2

## 2012-02-07 MED ORDER — ALUM & MAG HYDROXIDE-SIMETH 200-200-20 MG/5ML PO SUSP
15.0000 mL | ORAL | Status: DC | PRN
Start: 2012-02-07 — End: 2012-02-10
  Administered 2012-02-07: 15 mL via ORAL
  Filled 2012-02-07: qty 30

## 2012-02-07 NOTE — Evaluation (Signed)
Physical Therapy Evaluation Patient Details Name: Peggy Jennings MRN: [id number] DOB: 1946-03-08 Today's Date: 02/07/2012  Problem List:  Patient Active Problem List  Diagnoses  . Periprosthetic fracture around internal prosthetic right knee joint  . Status post revision of total knee replacement  . HTN (hypertension)    Past Medical History:  Past Medical History  Diagnosis Date  . Fibromyalgia   . History of atrial fibrillation     ablation performed in 2007.  Marland Kitchen Hypertension   . Dysrhythmia     Hx of atrial fib. See progress note.   . Hyperlipemia   . Shortness of breath   . Asthma   . GERD (gastroesophageal reflux disease)   . Recurrent upper respiratory infection (URI)   . History of bronchitis   . Urinary tract infection 2 months ago   . Depression   . Blood transfusion   . History of anemia    Past Surgical History:  Past Surgical History  Procedure Date  . Appendectomy 1960  . Abdominal hysterectomy 1977  . Plantar fascia surgery 7-8 Years     Right Foot  . Finger surgery     left index finger, right thumb, cyst removal  . Total knee revision 01/20/2012    Procedure: TOTAL KNEE REVISION;  Surgeon: Raymon Mutton, MD;  Location: [facility] OR;  Service: Orthopedics;  Laterality: Right;  . Joint replacement 2-3 years    Right Hip  . Joint replacement 3-4 years ago    Left Hip  . Joint replacement 4 years ago     Right Knee  . Joint replacement 10 years ago     Left Knee  . Joint replacement 01/20/2012    Right knee  . Orif femur fracture 01/2012    PT Assessment/Plan/Recommendation PT Assessment Clinical Impression Statement: Patient s/o right TKA and periprosthetic fracture on POD 1.  Now s/p ORIF and NWB on right LE.  Presents with decreased activity tolerance, decreased right LE AROM/strength and limited independence with mobility with decreased balance and fear of falling.  She will benefit from skilled PT in the acute setting to maximize independence and  allow decreased burden of care at next venue. PT Recommendation/Assessment: Patient will need skilled PT in the acute care venue PT Problem List: Decreased strength;Decreased range of motion;Decreased activity tolerance;Decreased mobility;Pain;Decreased balance;Decreased safety awareness PT Therapy Diagnosis : Difficulty walking;Acute pain;Generalized weakness PT Plan PT Frequency: Min 5X/week PT Treatment/Interventions: DME instruction;Gait training;Functional mobility training;Therapeutic activities;Therapeutic exercise;Balance training;Patient/family education PT Recommendation Follow Up Recommendations: Skilled nursing facility Equipment Recommended:Defer to next venue PT Goals  Acute Rehab PT Goals PT Goal Formulation: With patient Time For Goal Achievement: 7 days Pt will go Supine/Side to Sit: with min assist PT Goal: Supine/Side to Sit - Progress: Goal set today Pt will go Sit to Supine/Side: with min assist PT Goal: Sit to Supine/Side - Progress: Goal set today Pt will go Sit to Stand: with min assist Pt will go Stand to Sit: with min assist PT Goal: Stand to Sit - Progress: Goal set today Pt will Transfer Bed to Chair/Chair to Bed: with min assist PT Transfer Goal: Bed to Chair/Chair to Bed - Progress: Goal set today Pt will Ambulate: with min assist;1 - 15 feet PT Goal: Ambulate - Progress: Goal set today Pt will Perform Home Exercise Program: with supervision, verbal cues required/provided PT Goal: Perform Home Exercise Program - Progress: Goal set today  PT Evaluation Precautions/Restrictions  Precautions Precautions: Fall Required Braces or Orthoses: No  Restrictions Weight Bearing Restrictions: Yes RLE Weight Bearing: Non weight bearing Prior Functioning  Home Living Lives With: Spouse Receives Help From: Family  Type of Home: House  Home Layout: One level  Home Access: Ramped entrance Bathroom Shower/Tub: Health visitor: Handicapped  height Bathroom Accessibility: Yes How Accessible: Accessible via walker Home Adaptive Equipment: Walker - rolling;Straight cane;Bedside commode/3-in-1;Shower chair with back Prior Function Level of Independence: Independent with basic ADLs;Independent with gait;Independent with homemaking with ambulation (prior to TKA 2/25 Simultaneous filing. User may not have seen previous data.) Driving: Yes Vocation: Retired Producer, television/film/video: Awake/alert Overall Cognitive Status: Appears within functional limits for tasks assessed Orientation Level: Oriented X4 Sensation/Coordination Sensation Light Touch: Appears Intact Extremity AssessmentRLE Assessment RLE Assessment: Exceptions to [facility] RLE AROM (degrees) RLE Overall AROM Comments: ankle WFL, otherwise not tested due to pain LLE Assessment LLE Assessment: Within Functional Limits Mobility (including Balance) Bed Mobility Bed Mobility: No Supine to Sit: 3: Mod assist;4: Min assist Supine to Sit Details (indicate cue type and reason): patient used overhad trapeze and rails and HOB up to 30*; assist for right LE Sitting - Scoot to [place] of Bed: 4: Min assist Sitting - Scoot to [place] of Bed Details (indicate cue type and reason): for right LE Transfers Sit to Stand: 1: +2 Total assist;From bed;Without upper extremity assist Sit to Stand Details (indicate cue type and reason): pt 40 % sit to stand Stand to Sit: To chair/3-in-1;With upper extremity assist;1: +2 Total assist Stand to Sit Details: pt=40%, assist for leg outr and to control descent Stand Pivot Transfers: 1: +2 Total assist Stand Pivot Transfer Details (indicate cue type and reason): pt=60% used RW to stand-pivot to recliner    Exercise  Total Joint Exercises Ankle Circles/Pumps: AROM;Both;10 reps;Supine Quad Sets: AROM;Both;10 reps;Supine End of Session PT - End of Session Equipment Utilized During Treatment: Gait belt Activity Tolerance: Patient limited  by pain;Other (comment) (and by nausea) Patient left: in chair;with call bell in reach;with family/visitor present General Behavior During Session: [facility] for tasks performed (Simultaneous filing. User may not have seen previous data.) Cognition: [facility] for tasks performed (Simultaneous filing. User may not have seen previous data.)  Hamlin Devine,CYNDI 02/07/2012, 1:03 PM

## 2012-02-07 NOTE — Op Note (Signed)
Peggy Jennings NO.:  [id number]  MEDICAL RECORD NO.:  [id number]  LOCATION:  5122                         FACILITY:  [facility]  PHYSICIAN:  Doralee Albino. Carola Frost, M.D. DATE OF BIRTH:  1946-02-07  DATE OF PROCEDURE:  02/06/2012 DATE OF DISCHARGE:                              OPERATIVE REPORT   PREOPERATIVE DIAGNOSIS:  Right periprosthetic femur fracture.  POSTOPERATIVE DIAGNOSIS:  Right periprosthetic femur fracture.  PROCEDURE:  Open reduction and internal fixation of right periprosthetic femoral supracondylar fracture.  SURGEON:  Doralee Albino. Carola Frost, MD  ASSISTING:  Mearl Latin, PA-C  ANESTHESIA:  General.  COMPLICATIONS:  None.  FINDINGS:  240 mL hematoma.  TOTAL INS AND OUTS:  Crystalloid 2000 mL blood, 700 mL out, 650 mL of urine, blood 500 mL.  DISPOSITION:  To PACU.  CONDITION:  Stable.  TOURNIQUET:  None.  BRIEF SUMMARY OF INDICATION FOR PROCEDURE:  Peggy Jennings is a very pleasant 66 year old female who underwent a complex total knee revision with stem placement.  She fell while being bathed in the postoperative period sustaining a nondisplaced femoral fracture that subsequently displaced in the skilled nursing facility.  I did review the films and history with Dr. Sherlean Foot preoperatively and I believe that the femoral fracture warranted repair to minimize morbidity and maximize healing potential.  I did discuss with her the risks and benefits of surgery including the possibility of malunion, nonunion, infection, nerve injury, vessel injury, DVT, PE, heart attack, stroke, loss of motion, symptomatic hardware, need for further surgery, and multiple others. The patient did wish to proceed as recommended with repair.  BRIEF SUMMARY OF PROCEDURE:  Peggy Jennings was administered preoperative antibiotics, taken to the operating room where general anesthesia was induced.  Her right lower extremity was prepped and draped in usual sterile fashion.  We  did careful full longitudinal traction during this process.  I initially began with a distal incision and a proximal one both lateral to establish the appropriate trajectory for plate placement and reduction.  I was unable, however, to close down the fracture site sufficiently despite use of an auxiliary incision in the midthigh.  I did select the appropriate size plate and checked this with x-ray, an extension of the incision was then made including through the IT band, but I did not do any deep stripping of any kind.  The cable passing device was slipped over the anterior cortex up to the bone and brought out the lateral side first distally and then more proximally to reconstitute integrity of the femoral tube and then the cerclage tightened sequentially until we were pleased with the overall alignment and reduction.  Because of her previous total knees, we were unable to get the plate fit completely anatomic per its design distally and remained off the bone functioning more as an internal external-type fixator.  My assistant, Montez Morita, pulled maximal traction through the extremity while using bumps to affect the reduction on the lateral.  We eventually ended up placing 6 distal screws including 2 bicortical 5-0 and additional unicortical 4-0 screws.  Locking caps were placed over top of all these screws proximally.  We ended up with 2 bicortical screws, 2  unicortical screws into the lateral cortex and the hip stem and then the 2 cerclage wires.  We did try to bridge the fractures much as we could as well.  Final images showed appropriate reduction with some continued displacement of the anterior cortex only at the distal end of the fracture, but again overall excellent alignment and appropriate hardware trajectory and length.  Montez Morita, PA-C assisted me throughout the procedure and this included the standard layered closure with #1 Vicryl, 0 Vicryl, 2-0 Vicryl and his assistance  enabled the correct and safe completion of the case and also expedited it considerably.  PROGNOSIS:  Peggy Jennings will be nonweightbearing on the right lower extremity with unrestricted range of motion of the knee.  We will plan to follow her blood count here in the hospital should she requires any additional transfusions and we will discharge her with pharmacologic DVT prophylaxis.  We anticipate to seeing her back in the office in 10-14 days for removal of sutures.     Doralee Albino. Carola Frost, M.D.     [facility]/MEDQ  D:  02/06/2012  T:  02/07/2012  Job:  [id number]

## 2012-02-07 NOTE — Progress Notes (Signed)
I have seen and examined the patient. I agree with the findings above.  Budd Palmer, MD 02/07/2012 2:08 PM

## 2012-02-07 NOTE — Progress Notes (Signed)
Utilization review completed. Jacobey Gura, RN, BSN.  02/07/12  

## 2012-02-07 NOTE — Anesthesia Postprocedure Evaluation (Signed)
  Anesthesia Post-op Note  Patient: Peggy Jennings  Procedure(s) Performed: Procedure(s) (LRB): OPEN REDUCTION INTERNAL FIXATION (ORIF) DISTAL FEMUR FRACTURE (Right)  Patient Location: PACU  Anesthesia Type: General  Level of Consciousness: awake, alert  and oriented  Airway and Oxygen Therapy: Patient Spontanous Breathing and Patient connected to nasal cannula oxygen  Post-op Pain: mild  Post-op Assessment: Post-op Vital signs reviewed and Patient's Cardiovascular Status Stable  Post-op Vital Signs: stable  Complications: No apparent anesthesia complications

## 2012-02-07 NOTE — Progress Notes (Signed)
Physical Therapy Treatment Patient Details Name: Peggy Jennings MRN: [id number] DOB: Aug 14, 1946 Today's Date: 02/07/2012  PT Assessment/Plan  PT - Assessment/Plan Comments on Treatment Session: Patient much less anxious and with less nausea with back to bed today.  Able to mantain NWB right LE using scooting steps to back up to bed.  Will need to start gentle AAROM to knee as tolerated. PT Plan: Discharge plan remains appropriate PT Frequency: Min 5X/week Follow Up Recommendations: Skilled nursing facility Equipment Recommended: Defer to next venue PT Goals  Acute Rehab PT Goals PT Goal Formulation: With patient Time For Goal Achievement: 7 days Pt will go Sit to Supine/Side: with min assist PT Goal: Sit to Supine/Side - Progress: Met Pt will go Sit to Stand: with min assist PT Goal: Sit to Stand - Progress: Progressing toward goal Pt will go Stand to Sit: with min assist PT Goal: Stand to Sit - Progress: Progressing toward goal Pt will Transfer Bed to Chair/Chair to Bed: with min assist PT Transfer Goal: Bed to Chair/Chair to Bed - Progress: Progressing toward goal PT Treatment Precautions/Restrictions  Precautions Precautions: Fall Required Braces or Orthoses: No Restrictions Weight Bearing Restrictions: Yes RLE Weight Bearing: Non weight bearing Mobility (including Balance) Bed Mobility Bed Mobility: No Sit to Supine: 4: Min assist Sit to Supine - Details (indicate cue type and reason): for right LE Transfers Sit to Stand: 3: Mod assist;With upper extremity assist;From chair/3-in-1 Sit to Stand Details (indicate cue type and reason): patient's right foot resting on PT's foot and 40% lifting help from recliner Stand to Sit: 3: Mod assist;To bed;With upper extremity assist Stand to Sit Details: assist for leg out to sit Stand Pivot Transfers: 3: Mod assist Stand Pivot Transfer Details (indicate cue type and reason): with RW and scooting left foot around to back up to bed  from recliner    Exercise   End of Session PT - End of Session Equipment Utilized During Treatment: Gait belt Activity Tolerance: Patient limited by pain Patient left: in bed;with call bell in reach General Behavior During Session: [facility] for tasks performed Cognition: [facility] for tasks performed  [facility] 02/07/2012, 4:26 PM

## 2012-02-07 NOTE — Evaluation (Signed)
Occupational Therapy Evaluation Patient Details Name: Peggy Jennings MRN: [id number] DOB: 1946/04/02 Today's Date: 02/07/2012 10:15-10:49  evII Problem List:  Patient Active Problem List  Diagnoses  . Periprosthetic fracture around internal prosthetic right knee joint  . Status post revision of total knee replacement  . HTN (hypertension)    Past Medical History:  Past Medical History  Diagnosis Date  . Fibromyalgia   . History of atrial fibrillation     ablation performed in 2007.  Marland Kitchen Hypertension   . Dysrhythmia     Hx of atrial fib. See progress note.   . Hyperlipemia   . Shortness of breath   . Asthma   . GERD (gastroesophageal reflux disease)   . Recurrent upper respiratory infection (URI)   . History of bronchitis   . Urinary tract infection 2 months ago   . Depression   . Blood transfusion   . History of anemia    Past Surgical History:  Past Surgical History  Procedure Date  . Appendectomy 1960  . Abdominal hysterectomy 1977  . Plantar fascia surgery 7-8 Years     Right Foot  . Finger surgery     left index finger, right thumb, cyst removal  . Total knee revision 01/20/2012    Procedure: TOTAL KNEE REVISION;  Surgeon: Raymon Mutton, MD;  Location: [facility] OR;  Service: Orthopedics;  Laterality: Right;  . Joint replacement 2-3 years    Right Hip  . Joint replacement 3-4 years ago    Left Hip  . Joint replacement 4 years ago     Right Knee  . Joint replacement 10 years ago     Left Knee  . Joint replacement 01/20/2012    Right knee  . Orif femur fracture 01/2012    OT Assessment/Plan/Recommendation OT Assessment Clinical Impression Statement: Pt admiitted for  right femur fracture and subsequent ORIF.  Presents with severe increased dependence with basic selfcare tasks and functional transfers. Will benefit from acute OT to address this current increas in dependence with ADLs.  Subsequently feel pt will need extensive SNF rehab at discharge .   OT  Recommendation/Assessment: Patient will need skilled OT in the acute care venue OT Problem List: Decreased strength;Decreased activity tolerance;Impaired balance (sitting and/or standing);Pain;Decreased knowledge of use of DME or AE OT Therapy Diagnosis : Generalized weakness;Acute pain OT Plan OT Frequency: Min 1X/week OT Treatment/Interventions: Self-care/ADL training;Therapeutic activities;DME and/or AE instruction;Balance training;Patient/family education OT Recommendation Follow Up Recommendations: Skilled nursing facility Equipment Recommended: None recommended by OT (Simultaneous filing. User may not have seen previous data.) Individuals Consulted Consulted and Agree with Results and Recommendations: Patient OT Goals Acute Rehab OT Goals OT Goal Formulation: With patient Time For Goal Achievement: 2 weeks ADL Goals Pt Will Perform Lower Body Bathing: with min assist;Sit to stand from bed ADL Goal: Lower Body Bathing - Progress: Goal set today Pt Will Perform Lower Body Dressing: with min assist;Sit to stand from bed;with adaptive equipment ADL Goal: Lower Body Dressing - Progress: Goal set today Pt Will Transfer to Toilet: with min assist;with DME;3-in-1;Maintaining weight bearing status ADL Goal: Toilet Transfer - Progress: Goal set today Pt Will Perform Toileting - Hygiene: with min assist;Sit to stand from 3-in-1/toilet ADL Goal: Toileting - Hygiene - Progress: Goal set today  OT Evaluation Precautions/Restrictions  Precautions Precautions: Fall Required Braces or Orthoses: No Restrictions Weight Bearing Restrictions: Yes RLE Weight Bearing: Non weight bearing Prior Functioning Home Living Lives With: Spouse (Simultaneous filing. User may not have seen  previous data.) Receives Help From: Family (Simultaneous filing. User may not have seen previous data.) Type of Home: House (Simultaneous filing. User may not have seen previous data.) Home Layout: One level  (Simultaneous filing. User may not have seen previous data.) Home Access: Ramped entrance (Simultaneous filing. User may not have seen previous data.) Bathroom Shower/Tub: Walk-in shower (Simultaneous filing. User may not have seen previous data.) Bathroom Toilet: Handicapped height (Simultaneous filing. User may not have seen previous data.) Bathroom Accessibility: Yes How Accessible: Accessible via walker Home Adaptive Equipment: Walker - rolling;Straight cane;Bedside commode/3-in-1;Shower chair with back (Simultaneous filing. User may not have seen previous data.) Prior Function Level of Independence: Independent with basic ADLs;Independent with gait;Independent with homemaking with ambulation (prior to TKA 2/25 Simultaneous filing. User may not have seen previous data.) Driving: Yes Vocation: Retired ADL ADL Eating/Feeding: Simulated;Independent Where Assessed - Eating/Feeding: Bed level Grooming: Performed;Wash/dry face;Set up Where Assessed - Grooming: Unsupported;Sitting, bed Upper Body Bathing: Simulated Where Assessed - Upper Body Bathing: Unsupported;Sitting, bed Lower Body Bathing: Simulated;+2 Total assistance Lower Body Bathing Details (indicate cue type and reason): pt 40 % sit to stand Where Assessed - Lower Body Bathing: Sit to stand from bed Upper Body Dressing: Simulated;Set up Where Assessed - Upper Body Dressing: Sitting, bed;Unsupported Lower Body Dressing: Simulated;+2 Total assistance Lower Body Dressing Details (indicate cue type and reason): pt 40 % sit to stand Where Assessed - Lower Body Dressing: Sit to stand from bed Toilet Transfer: Simulated;+2 Total assistance Toilet Transfer Details (indicate cue type and reason): pt 60% for transfer Toilet Transfer Method: Stand pivot Toilet Transfer Equipment: Other (comment) (bedside chair, pt still with cathetor.) Toileting - Clothing Manipulation: +2 Total assistance Toileting - Clothing Manipulation Details  (indicate cue type and reason): pt 40 % for sit to stand. Where Assessed - Toileting Clothing Manipulation: Other (comment) (simulated EOB) Where Assessed - Toileting Hygiene: Other (comment) (simulated EOB.) Tub/Shower Transfer: Not assessed Tub/Shower Transfer Method: Not assessed Equipment Used: Rolling walker Ambulation Related to ADLs: Pt only able to tolerate stand pivot to bedside chair today using RW.  Pt limited secondary to nausea and pain. ADL Comments: Pt with decreased ability to perform sit to stand or reach feet for simulated dressing tasks.  Has AE from previous hip surgery.  Will likely need for functional use with LB selfcare.   Vision/Perception  Vision - History Baseline Vision: No visual deficits Cognition Cognition Arousal/Alertness: Awake/alert Overall Cognitive Status: Appears within functional limits for tasks assessed Orientation Level: Oriented X4 Sensation/Coordination Sensation Light Touch: Appears Intact Stereognosis: Not tested Hot/Cold: Not tested Proprioception: Not tested Coordination Gross Motor Movements are Fluid and Coordinated: Yes Fine Motor Movements are Fluid and Coordinated: Yes Extremity Assessment RUE Assessment RUE Assessment: Within Functional Limits LUE Assessment LUE Assessment: Within Functional Limits Mobility  Bed Mobility Bed Mobility: No Supine to Sit: 3: Mod assist;4: Min assist Supine to Sit Details (indicate cue type and reason): patient used overhad trapeze and rails and HOB up to 30*; assist for right LE Sitting - Scoot to [place] of Bed: 4: Min assist Sitting - Scoot to [place] of Bed Details (indicate cue type and reason): for right LE Transfers Transfers: Yes Sit to Stand: 1: +2 Total assist;From bed;Without upper extremity assist Sit to Stand Details (indicate cue type and reason): pt 40 % sit to stand Stand to Sit: To chair/3-in-1;With upper extremity assist;1: +2 Total assist Stand to Sit Details: pt=40%, assist for  leg outr and to control descent End of Session  OT - End of Session Equipment Utilized During Treatment: Gait belt;Other (comment) (rolling walker) Activity Tolerance: Patient tolerated treatment well Patient left: in chair;with call bell in reach;with family/visitor present General Behavior During Session: [facility] for tasks performed (Simultaneous filing. User may not have seen previous data.) Cognition: [facility] for tasks performed (Simultaneous filing. User may not have seen previous data.)   Kinnie Kaupp OTR/L 02/07/2012, 1:03 PM  Pager number [phone]

## 2012-02-07 NOTE — Progress Notes (Signed)
CARE MANAGEMENT NOTE 02/07/2012    Subjective/Objective Assessment:   66 yr old female s/p ORIF of right periprosthetic femur fracture.     Action/Plan:   Discharge planning. Spoke with patient. States she will be returning to [facility] for rehab.   Anticipated DC Date:  02/10/2012   Anticipated DC Plan:  SKILLED NURSING FACILITY  In-house referral  Clinical Social Worker      DC Planning Services  CM consult      [facility] Choice  Resumption Of Svcs/PTA Provider   Choice offered to / List presented to:  NA   DME arranged  NA      DME agency  NA     HH arranged  NA      HH agency  NA   Status of service:  Completed, signed off Discharge Disposition:  SKILLED NURSING FACILITY  P

## 2012-02-07 NOTE — Progress Notes (Signed)
Subjective: 1 Day Post-Op Procedure(s) (LRB): OPEN REDUCTION INTERNAL FIXATION (ORIF) DISTAL FEMUR FRACTURE (Right)  Doing ok this am Pain overnight, little rest overnight Ate breakfast this am and tolerated well No chest pain, no sob Denies numbness or tingling  Objective: Current Vitals Blood pressure 160/54, pulse 100, temperature 98.6 F (37 C), temperature source Oral, resp. rate 18, height 5\' 6"  (1.676 m), weight 108.41 kg (239 lb), SpO2 100.00%. Vital signs in last 24 hours: Temp:  [97.6 F (36.4 C)-100.7 F (38.2 C)] 98.6 F (37 C) (03/15 0625) Pulse Rate:  [84-108] 100  (03/15 0625) Resp:  [14-20] 18  (03/15 0752) BP: (133-178)/(54-71) 160/54 mmHg (03/15 0625) SpO2:  [97 %-100 %] 100 % (03/15 0752) FiO2 (%):  [2 %] 2 % (03/14 1500) Weight:  [108.41 kg (239 lb)] 108.41 kg (239 lb) (03/14 1022)  Intake/Output from previous day: 03/14 0701 - 03/15 0700 In: 3973.5 [I.V.:3273.5; Blood:700] Out: 5050 [Urine:4550; Blood:500]  LABS  Basename 02/07/12 0600 02/06/12 1345 02/05/12 1613  HGB 10.6* 13.0 9.4*    Basename 02/07/12 0600 02/06/12 1345  WBC 8.6 13.9*  RBC 3.53* 4.25  HCT 31.7* 38.3  PLT 275 280    Basename 02/07/12 0600 02/05/12 1613  NA 137 139  K 3.8 4.1  CL 103 104  CO2 28 28  BUN 6 15  CREATININE 0.48* 0.75  GLUCOSE 138* 103*  CALCIUM 8.2* 8.6    Basename 02/05/12 1613  LABPT --  INR 0.99     Physical Exam  Gen:NAD, appears comfortable Neuro: non-focal, answers all question appropriately, A&O x 3 (person, place, time/president) Lungs: clear Cardiac:Reg, s1 and s2 Abd: obese, NT Ext: Left Lower extremity  Dressing c/d/I  Distal motor and sensory functions intact  Extremity is warm  No DCT  + dp pulse  Good AROM distally    Imaging Dg Femur Right  02/06/2012  *RADIOLOGY REPORT*  Clinical Data: Right femur fracture.  RIGHT FEMUR - 2 VIEW  Comparison: Radiographs dated 02/05/2012  Findings: The patient has undergone insertion of  a long side plate on the right femur with multiple cerclage wires around the long spiral fracture of the femoral shaft.  Position is near anatomic in the AP projection.  There is slight displacement and angulation of the distal portion of the fracture on the lateral view.  However, this is much improved over the preoperative study.  IMPRESSION: Improvement of alignment and position of the spiral fracture of the femoral shaft after open reduction and internal fixation.  Original Report Authenticated By: Gwynn Burly, M.D.   Dg Femur Right  02/06/2012  *RADIOLOGY REPORT*  Clinical Data: ORIF right femur.  RIGHT FEMUR - 2 VIEW  Comparison: 02/05/2012  Findings: Multiple intraoperative spot images demonstrate lateral plate and screw placement across the midshaft right femoral fracture.  Multiple cerclage wires also noted.  Near anatomic alignment.  IMPRESSION: Multiple spot images for internal fixation as above.  Original Report Authenticated By: Cyndie Chime, M.D.   Dg Femur Right  02/05/2012  *RADIOLOGY REPORT*  Clinical Data: Pain  RIGHT FEMUR - 2 VIEW  Comparison: 01/22/2012  Findings: Right total hip arthroplasty is in place.  Anatomic alignment the.  No breakage or loosening of the hardware.  A revision total knee arthroplasty is also in place.  The no breakage or loosening of the hardware.  There is a spiral fracture of the distal femur at the vicinity of the stem of the femoral component of the knee arthroplasty the  fracture is minimally displaced.  The fracture extends to the distal aspect of the femoral prosthetic component of the knee.  IMPRESSION: Acute distal femur fracture with displacement.  Original Report Authenticated By: Donavan Burnet, M.D.   Dg Knee 1-2 Views Right  02/05/2012  *RADIOLOGY REPORT*  Clinical Data: Preoperative exam for fracture  RIGHT KNEE - 1-2 VIEW  Comparison: 01/22/2012  Findings: There is a mildly comminuted fracture of the distal femoral diaphyseal metaphyseal  region extending through the region of the femoral stem.  There is slight anterior angulation.  IMPRESSION: Mildly comminuted fracture of the distal diaphyseal/metaphyseal region of the femur extending to the region of the femoral stem.  Original Report Authenticated By: Thomasenia Sales, M.D.   Dg Chest Port 1 View  02/06/2012  *RADIOLOGY REPORT*  Clinical Data: Line placement  PORTABLE CHEST - 1 VIEW  Comparison: January 07, 2012  Findings: The right IJ central line tip is at the cavoatrial junction.  No pneumothorax.  The cardiac silhouette and pulmonary vasculature are within normal limits.  The patient is rotated to the right but the mediastinum is felt to be normal for technique.  IMPRESSION: Central line tip at cavoatrial junction.  No pneumothorax.  Original Report Authenticated By: Brandon Melnick, M.D.    Assessment/Plan: 1 Day Post-Op Procedure(s) (LRB): OPEN REDUCTION INTERNAL FIXATION (ORIF) DISTAL FEMUR FRACTURE (Right)  66 y/o female s/p R distal femur periprosthetic fracture POD #1  1. R periprosthetic distal femur fx, pod 1  NWB R Lex  R knee ROM as tolerated  Will get hinged brace to facilitate plane of motion  Ice and elevate  Dressing change tomorrow 2. HTN  Restart home meds  Hydralazine prn 3. GERD  Prophylaxis 4. Fibromyalgia  Home meds 5. Pain  Continue with PCA for another day  Continue to titrate po's accordingly 6. DVT/PE prophylaxis  Lovenox x 21 days  Do no want to place on coumadin as pt continues to be a fall risk 7. FEN  Diet as tolerated  Decrease IVF  Will maintain Central line until pts PO intake is adequate 8. Misc  Family requested CT scan of head secondary to fall 2 weeks ago stating pt was "talking out of her mind." based on my interactions with the pt yesterday and today, as well as clinical exam, I do not appreciate any acute/subacute processes that warrant CT evaluation of the head.  Pt is alert, acting appropriately and so forth, she is  able to process her current situation and appears stable.  It is possible that her atypical behavior may have been secondary to medications.  Will re-evaluate pain meds.  9. Dispo  PT/OT today  Return to [facility] when stable, probably monday   Mearl Latin, PA-C [facility] [phone] (P) 02/07/2012, 9:17 AM

## 2012-02-08 LAB — CBC
HCT: 28.5 % — ABNORMAL LOW (ref 36.0–46.0)
Hemoglobin: 9.4 g/dL — ABNORMAL LOW (ref 12.0–15.0)
MCHC: 33 g/dL (ref 30.0–36.0)
RBC: 3.15 MIL/uL — ABNORMAL LOW (ref 3.87–5.11)

## 2012-02-08 LAB — BASIC METABOLIC PANEL
BUN: 8 mg/dL (ref 6–23)
CO2: 28 mEq/L (ref 19–32)
Chloride: 105 mEq/L (ref 96–112)
Glucose, Bld: 111 mg/dL — ABNORMAL HIGH (ref 70–99)
Potassium: 3.6 mEq/L (ref 3.5–5.1)
Sodium: 139 mEq/L (ref 135–145)

## 2012-02-08 MED ORDER — MORPHINE SULFATE (PF) 1 MG/ML IV SOLN
INTRAVENOUS | Status: AC
  Filled 2012-02-08: qty 25

## 2012-02-08 NOTE — Progress Notes (Signed)
PT Treatment Note   02/08/12 1703  PT Visit Information  Last PT Received On 02/08/12  Precautions  Precautions Fall  Required Braces or Orthoses No  Restrictions  Weight Bearing Restrictions Yes  RLE Weight Bearing NWB  Bed Mobility  Bed Mobility Yes  Sit to Supine 3: Mod assist;HOB flat  Sit to Supine - Details (indicate cue type and reason) Assist with legs  Transfers  Sit to Stand 1: +2 Total assist;Patient percentage (comment);With upper extremity assist;With armrests;From chair/3-in-1  Sit to Stand Details (indicate cue type and reason) cues for hand placement  Stand to Sit 3: Mod assist;With upper extremity assist;To bed  Stand to Sit Details assist to control descent  Stand Pivot Transfers 1: +2 Total assist;Patient percentage (comment)  Stand Pivot Transfer Details (indicate cue type and reason) used walker and went toward left side  PT - End of Session  Equipment Utilized During Treatment Gait belt  Activity Tolerance Patient tolerated treatment well  Patient left in bed;with call bell in reach  Nurse Communication Mobility status for transfers  General  Behavior During Session [facility] for tasks performed  Cognition [facility] for tasks performed  PT - Assessment/Plan  Comments on Treatment Session Pt becoming more confident with transfers.  PT Plan Discharge plan remains appropriate  PT Frequency Min 5X/week  Follow Up Recommendations Skilled nursing facility  Equipment Recommended Defer to next venue  Acute Rehab PT Goals  PT Goal: Sit to Supine/Side - Progress Progressing toward goal  PT Goal: Sit to Stand - Progress Progressing toward goal  PT Goal: Stand to Sit - Progress Progressing toward goal  PT Transfer Goal: Bed to Chair/Chair to Bed - Progress Progressing toward goal  PT Goal: Ambulate - Progress Progressing toward goal    [facility] PT [phone]

## 2012-02-08 NOTE — Progress Notes (Signed)
Subjective: Doing well.  Pain controlled.  Objective: Vital signs in last 24 hours: Temp:  [97.8 F (36.6 C)-99.9 F (37.7 C)] 98.4 F (36.9 C) (03/16 0533) Pulse Rate:  [81-97] 83  (03/16 0533) Resp:  [16-24] 18  (03/16 0826) BP: (113-166)/(53-84) 135/53 mmHg (03/16 0533) SpO2:  [94 %-98 %] 96 % (03/16 0826)  Intake/Output from previous day: 03/15 0701 - 03/16 0700 In: 1998.5 [P.O.:600; I.V.:1298.5; IV Piggyback:100] Out: 3000 [Urine:3000] Intake/Output this shift:     Basename 02/08/12 0500 02/07/12 0600 02/06/12 1345 02/05/12 1613  HGB 9.4* 10.6* 13.0 9.4*    Basename 02/08/12 0500 02/07/12 0600  WBC 9.1 8.6  RBC 3.15* 3.53*  HCT 28.5* 31.7*  PLT 266 275    Basename 02/08/12 0500 02/07/12 0600  NA 139 137  K 3.6 3.8  CL 105 103  CO2 28 28  BUN 8 6  CREATININE 0.53 0.48*  GLUCOSE 111* 138*  CALCIUM 8.4 8.2*    Basename 02/05/12 1613  LABPT --  INR 0.99    Exam:  Wounds look good.  No drainage or signs of infection.  Nvi.  Calf nontender. Assessment/Plan: Continue present care.  Anticipate transfer to snf Monday.   Viveka Wilmeth M 02/08/2012, 9:34 AM

## 2012-02-08 NOTE — Progress Notes (Signed)
PT Treatment Note   02/08/12 1649  PT Visit Information  Last PT Received On 02/08/12  Precautions  Precautions Fall  Restrictions  Weight Bearing Restrictions Yes  RLE Weight Bearing NWB  Bed Mobility  Bed Mobility Yes  Supine to Sit 3: Mod assist  Supine to Sit Details (indicate cue type and reason) verbal cues for technique  Sitting - Scoot to Edge of Bed 4: Min assist  Transfers  Sit to Stand 1: +2 Total assist;Patient percentage (comment);From bed;With upper extremity assist (pt = 70%)  Sit to Stand Details (indicate cue type and reason) cues for hand placement  Stand to Sit 3: Mod assist;With upper extremity assist;With armrests;To chair/3-in-1 (pt=70%)  Stand to Sit Details assist to control descent  Stand Pivot Transfers 1: +2 Total assist;Patient percentage (comment) (pt = 70%)  Stand Pivot Transfer Details (indicate cue type and reason) Used walker.  Good maintainence of NWB  Total Joint Exercises  Ankle Circles/Pumps AROM;Both;10 reps;Seated  Quad Sets 10 reps;Both  PT - End of Session  Equipment Utilized During Treatment Gait belt  Activity Tolerance Patient tolerated treatment well  Patient left in chair;with call bell in reach  Nurse Communication Mobility status for transfers  General  Behavior During Session [facility] for tasks performed  Cognition [facility] for tasks performed  PT - Assessment/Plan  Comments on Treatment Session Pt becoming more confident with transfers.  PT Plan Discharge plan remains appropriate  PT Frequency Min 5X/week  Follow Up Recommendations Skilled nursing facility  Equipment Recommended Defer to next venue  Acute Rehab PT Goals  PT Goal: Supine/Side to Sit - Progress Progressing toward goal  PT Goal: Sit to Stand - Progress Progressing toward goal  PT Goal: Stand to Sit - Progress Progressing toward goal  PT Transfer Goal: Bed to Chair/Chair to Bed - Progress Progressing toward goal  PT Goal: Ambulate - Progress Progressing toward goal    PT Goal: Perform Home Exercise Program - Progress Progressing toward goal    [facility] PT  [phone]

## 2012-02-09 NOTE — Progress Notes (Signed)
Subjective: Doing well.  Pain controlled.  Denies cp, sob.     Objective: Vital signs in last 24 hours: Temp:  [97.9 F (36.6 C)-99.8 F (37.7 C)] 97.9 F (36.6 C) (03/17 0530) Pulse Rate:  [78-90] 78  (03/17 0530) Resp:  [16-18] 18  (03/17 0530) BP: (138-147)/(53-64) 147/64 mmHg (03/17 0530) SpO2:  [97 %-100 %] 98 % (03/17 0530)  Intake/Output from previous day: 03/16 0701 - 03/17 0700 In: 3082.8 [P.O.:960; I.V.:2122.8] Out: 3350 [Urine:3350] Intake/Output this shift: Total I/O In: 360 [P.O.:360] Out: 700 [Urine:700]   Basename 02/08/12 0500 02/07/12 0600 02/06/12 1345  HGB 9.4* 10.6* 13.0    Basename 02/08/12 0500 02/07/12 0600  WBC 9.1 8.6  RBC 3.15* 3.53*  HCT 28.5* 31.7*  PLT 266 275    Basename 02/08/12 0500 02/07/12 0600  NA 139 137  K 3.6 3.8  CL 105 103  CO2 28 28  BUN 8 6  CREATININE 0.53 0.48*  GLUCOSE 111* 138*  CALCIUM 8.4 8.2*   No results found for this basename: LABPT:2,INR:2 in the last 72 hours  Dressing c/d/i.  Calf nt, nvi.  Assessment/Plan: Continue present care.  Dr handy to follow in morning.   Peggy Jennings 02/09/2012, 12:36 PM

## 2012-02-10 DIAGNOSIS — E785 Hyperlipidemia, unspecified: Secondary | ICD-10-CM

## 2012-02-10 DIAGNOSIS — K219 Gastro-esophageal reflux disease without esophagitis: Secondary | ICD-10-CM

## 2012-02-10 DIAGNOSIS — D62 Acute posthemorrhagic anemia: Secondary | ICD-10-CM

## 2012-02-10 DIAGNOSIS — M797 Fibromyalgia: Secondary | ICD-10-CM

## 2012-02-10 HISTORY — DX: Hyperlipidemia, unspecified: E78.5

## 2012-02-10 LAB — VITAMIN D 1,25 DIHYDROXY
Vitamin D 1, 25 (OH)2 Total: 65 pg/mL (ref 18–72)
Vitamin D3 1, 25 (OH)2: 65 pg/mL

## 2012-02-10 LAB — PTH, INTACT AND CALCIUM
Calcium, Total (PTH): 8 mg/dL — ABNORMAL LOW (ref 8.4–10.5)
PTH: 202 pg/mL — ABNORMAL HIGH (ref 14.0–72.0)

## 2012-02-10 MED ORDER — ACETAMINOPHEN 325 MG PO TABS: 325.0000 mg | ORAL_TABLET | Freq: Four times a day (QID) | ORAL | Status: AC | PRN

## 2012-02-10 MED ORDER — DSS 100 MG PO CAPS: 100.0000 mg | ORAL_CAPSULE | Freq: Two times a day (BID) | ORAL | Status: AC

## 2012-02-10 MED ORDER — ENOXAPARIN SODIUM 40 MG/0.4ML ~~LOC~~ SOLN: 40.0000 mg | Freq: Every day | SUBCUTANEOUS | Status: DC

## 2012-02-10 MED ORDER — METHOCARBAMOL 500 MG PO TABS: 500.0000 mg | ORAL_TABLET | Freq: Four times a day (QID) | ORAL | Status: AC | PRN

## 2012-02-10 MED ORDER — OXYCODONE HCL 5 MG PO TABS: 5.0000 mg | ORAL_TABLET | ORAL | Status: AC | PRN

## 2012-02-10 MED ORDER — HYDROCODONE-ACETAMINOPHEN 10-325 MG PO TABS: 1.0000 | ORAL_TABLET | Freq: Four times a day (QID) | ORAL | Status: AC | PRN

## 2012-02-10 MED ORDER — ACETAMINOPHEN 325 MG PO TABS: 325.0000 mg | ORAL_TABLET | Freq: Four times a day (QID) | ORAL | Status: DC | PRN

## 2012-02-10 NOTE — Discharge Instructions (Signed)
[facility] Discharge Instructions, Pin Site and Wound Care   General Discharge Instructions  WEIGHT BEARING STATUS: Nonweightbearing right leg  RANGE OF MOTION/ACTIVITY: Range of motion right knee as tolerated   STOP SMOKING OR USING NICOTINE PRODUCTS!!!!  As discussed nicotine severely impairs your body's ability to heal surgical and traumatic wounds but also impairs bone healing.  Wounds and bone heal by forming microscopic blood vessels (angiogenesis) and nicotine is a vasoconstrictor (essentially, shrinks blood vessels).  Therefore, if vasoconstriction occurs to these microscopic blood vessels they essentially disappear and are unable to deliver necessary nutrients to the healing tissue.  This is one modifiable factor that you can do to dramatically increase your chances of healing your injury.    (This means no smoking, no nicotine gum, patches, etc)  DO NOT USE NONSTEROIDAL ANTI-INFLAMMATORY DRUGS (NSAID'S)  Using products such as Advil (ibuprofen), Aleve (naproxen), Motrin (ibuprofen) for additional pain control during fracture healing can delay and/or prevent the healing response.  If you would like to take over the counter (OTC) medication, Tylenol (acetaminophen) is ok.  However, some narcotic medications that are given for pain control contain acetaminophen as well. Therefore, you should not exceed more than 4000 mg of tylenol in a day if you do not have liver disease.  Also note that there are may OTC medicines, such as cold medicines and allergy medicines that my contain tylenol as well.  If you have any questions about medications and/or interactions please ask your doctor/PA or your pharmacist.   PAIN MEDICATION USE AND EXPECTATIONS  You have likely been given narcotic medications to help control your pain.  After a traumatic event that results in an fracture (broken bone) with or without surgery, it is ok to use narcotic pain medications to help control one's pain.   We understand that everyone responds to pain differently and each individual patient will be evaluated on a regular basis for the continued need for narcotic medications. Ideally, narcotic medication use should last no more than 6-8 weeks (coinciding with fracture healing).   As a patient it is your responsibility as well to monitor narcotic medication use and report the amount and frequency you use these medications when you come to your office visit.   We would also advise that if you are using narcotic medications, you should take a dose prior to therapy to maximize you participation.  IF YOU ARE ON NARCOTIC MEDICATIONS IT IS NOT PERMISSIBLE TO OPERATE A MOTOR VEHICLE (MOTORCYCLE/CAR/TRUCK/MOPED) OR HEAVY MACHINERY DO NOT MIX NARCOTICS WITH OTHER CNS (CENTRAL NERVOUS SYSTEM) DEPRESSANTS SUCH AS ALCOHOL       ICE AND ELEVATE INJURED/OPERATIVE EXTREMITY  Using ice and elevating the injured extremity above your heart can help with swelling and pain control.  Icing in a pulsatile fashion, such as 20 minutes on and 20 minutes off, can be followed.    Do not place ice directly on skin. Make sure there is a barrier between to skin and the ice pack.    Using frozen items such as frozen peas works well as the conform nicely to the are that needs to be iced.  USE AN ACE WRAP OR TED HOSE FOR SWELLING CONTROL  In addition to icing and elevation, Ace wraps or TED hose are used to help limit and resolve swelling.  It is recommended to use Ace wraps or TED hose until you are informed to stop.    When using Ace Wraps start the wrapping distally (farthest away from  the body) and wrap proximally (closer to the body)   Example: If you had surgery on your leg or thing and you do not have a splint on, start the ace wrap at the toes and work your way up to the thigh        If you had surgery on your upper extremity and do not have a splint on, start the ace wrap at your fingers and work your way up to the upper  arm  IF YOU ARE IN A SPLINT OR CAST DO NOT REMOVE IT FOR ANY REASON   If your splint gets wet for any reason please contact the office immediately. You may shower in your splint or cast as long as you keep it dry.  This can be done by wrapping in a cast cover or garbage back (or similar)  Do Not stick any thing down your splint or cast such as pencils, money, or hangers to try and scratch yourself with.  If you feel itchy take benadryl as prescribed on the bottle for itching  CALL THE OFFICE WITH ANY QUESTIONS OR CONCERTS: [phone]     Discharge Pin Site Instructions  Dress pins daily with Kerlix roll starting on POD 2. Wrap the Kerlix so that it tamps the skin down around the pin-skin interface to prevent/limit motion of the skin relative to the pin.  (Pin-skin motion is the primary cause of pain and infection related to external fixator pin sites).  Remove any crust or coagulum that may obstruct drainage with a saline moistened gauze or soap and water.  After POD 3, if there is no discernable drainage on the pin site dressing, the interval for change can by increased to every other day.  You may shower with the fixator, cleaning all pin sites gently with soap and water.  If you have a surgical wound this needs to be completely dry and without drainage before showering.  The extremity can be lifted by the fixator to facilitate wound care and transfers.  Notify the office/Doctor if you experience increasing drainage, redness, or pain from a pin site, or if you notice purulent (thick, snot-like) drainage.  Discharge Wound Care Instructions  Do NOT apply any ointments, solutions or lotions to pin sites or surgical wounds.  These prevent needed drainage and even though solutions like hydrogen peroxide kill bacteria, they also damage cells lining the pin sites that help fight infection.  Applying lotions or ointments can keep the wounds moist and can cause them to breakdown and open up as  well. This can increase the risk for infection. When in doubt call the office.  Surgical incisions should be dressed daily.  If any drainage is noted, use one layer of adaptic, then gauze, Kerlix, and an ace wrap.  Once the incision is completely dry and without drainage, it may be left open to air out.  Showering may begin 36-48 hours later.  Cleaning gently with soap and water.  Traumatic wounds should be dressed daily as well.    One layer of adaptic, gauze, Kerlix, then ace wrap.  The adaptic can be discontinued once the draining has ceased    If you have a wet to dry dressing: wet the gauze with saline the squeeze as much saline out so the gauze is moist (not soaking wet), place moistened gauze over wound, then place a dry gauze over the moist one, followed by Kerlix wrap, then ace wrap.

## 2012-02-10 NOTE — Progress Notes (Signed)
Physical Therapy Treatment Patient Details Name: FOREVER ARECHIGA MRN: [id number] DOB: [birth year] Today's Date: 02/10/2012  PT Assessment/Plan  PT - Assessment/Plan Comments on Treatment Session: Pt more worried about transfer today and very fearful of falling. Encouarged patient on her progress and that her technique with transfer is very good. Continue to encourage patient and build confidence.  PT Plan: Discharge plan remains appropriate PT Frequency: Min 5X/week Follow Up Recommendations: Skilled nursing facility Equipment Recommended: Defer to next venue PT Goals  Acute Rehab PT Goals PT Goal: Supine/Side to Sit - Progress: Met PT Goal: Sit to Stand - Progress: Progressing toward goal PT Goal: Stand to Sit - Progress: Progressing toward goal PT Transfer Goal: Bed to Chair/Chair to Bed - Progress: Progressing toward goal  PT Treatment Precautions/Restrictions  Precautions Precautions: Fall Required Braces or Orthoses: No Restrictions Weight Bearing Restrictions: Yes RLE Weight Bearing: Non weight bearing Mobility (including Balance) Bed Mobility Bed Mobility: Yes Supine to Sit: 4: Min assist;With rails Supine to Sit Details (indicate cue type and reason): A for BLE positioning off of bed.  Sitting - Scoot to Edge of Bed: 5: Supervision Transfers Sit to Stand: 1: +2 Total assist;Patient percentage (comment);From elevated surface;With upper extremity assist;From bed (p=80%) Sit to Stand Details (indicate cue type and reason): Cues to recall safe technique. A for balance Stand to Sit: 4: Min assist;With upper extremity assist;With armrests;To chair/3-in-1 Stand to Sit Details: A to control descent. Cues for safe hand placement Stand Pivot Transfers: 1: +2 Total assist;Patient percentage (comment);With armrests (p=80%) Ambulation/Gait Ambulation/Gait: No    Exercise    End of Session PT - End of Session Equipment Utilized During Treatment: Gait belt Activity Tolerance:  Patient tolerated treatment well Patient left: in chair Nurse Communication: Mobility status for transfers General Behavior During Session: [facility] for tasks performed Cognition: [facility] for tasks performed  Robinette, Adline Potter 02/10/2012, 12:00 PM 02/10/2012 Fredrich Birks PTA [phone] pager [phone] office

## 2012-02-10 NOTE — Progress Notes (Signed)
Per MD order, central line removed. IV cathter intact. Vaseline pressure gauze to site, pressure held x 5 min, no bleeding to site. Pt instructed to keep dressing CDI x 24hours, if bleeding occurs hold pressure, if bleeding does not stop contact MD or go to the ED. Pt instructed to lay in bed for after CVC removed. Pt verbalized understanding and did not have any questions Peggy Jennings

## 2012-02-10 NOTE — Progress Notes (Signed)
Pt discharged in stable condition via stretcher, accompanied by ambulance personnel to [facility].

## 2012-02-10 NOTE — Progress Notes (Signed)
Subjective: 4 Days Post-Op Procedure(s) (LRB): OPEN REDUCTION INTERNAL FIXATION (ORIF) DISTAL FEMUR FRACTURE (Right)    Doing well and ready to go to SNF Nervous that she will fall again Denies CP No SOB No n/v  Objective: Current Vitals Blood pressure 164/65, pulse 86, temperature 98.5 F (36.9 C), temperature source Oral, resp. rate 18, height 5\' 6"  (1.676 m), weight 108.41 kg (239 lb), SpO2 94.00%. Vital signs in last 24 hours: Temp:  [97.9 F (36.6 C)-98.5 F (36.9 C)] 98.5 F (36.9 C) (03/18 0528) Pulse Rate:  [73-86] 86  (03/18 0528) Resp:  [18] 18  (03/18 0528) BP: (149-164)/(65-72) 164/65 mmHg (03/18 0528) SpO2:  [94 %-100 %] 94 % (03/18 0528)  Intake/Output from previous day: 03/17 0701 - 03/18 0700 In: 2486.3 [P.O.:1320; I.V.:1166.3] Out: 4875 [Urine:4875] Intake/Output      03/17 0701 - 03/18 0700 03/18 0701 - 03/19 0700   P.O. 1320    I.V. (mL/kg) 1166.3 (10.8)    Total Intake(mL/kg) 2486.3 (22.9)    Urine (mL/kg/hr) 4875 (1.9)    Total Output 4875    Net -2388.7            LABS  Basename 02/08/12 0500  HGB 9.4*    Basename 02/08/12 0500  WBC 9.1  RBC 3.15*  HCT 28.5*  PLT 266    Basename 02/08/12 0500  NA 139  K 3.6  CL 105  CO2 28  BUN 8  CREATININE 0.53  GLUCOSE 111*  CALCIUM 8.4   No results found for this basename: LABPT:2,INR:2 in the last 72 hours    Physical Exam  Gen: No acute distress Lungs: Clear Cardiac: Regular Abd: Nontender with positive bowel sounds Ext: Right lower extremity  Dressing is clean dry and intact  Distal motor and sensory functions are intact  Swelling is well-controlled  Extremity is warm  Patient is able to achieve what appeared to be full extension of her right knee  Palpable dorsalis pedis pulse is appreciated   Imaging No results found.  Assessment/Plan: 4 Days Post-Op Procedure(s) (LRB): OPEN REDUCTION INTERNAL FIXATION (ORIF) DISTAL FEMUR FRACTURE (Right)  66 year old female status  post right distal femur periprosthetic fracture postoperative day #4  1. Right periprosthetic distal femur fracture, postoperative day 4  Nonweightbearing right lower extremity for the next 6-8 weeks   range of motion as tolerated  Dressing changes as needed  Ice and elevate 2. Hypertension  Fair control  Continue with home meds 3. GERD  Home meds 4. Fibromyalgia  Home medications 5. Pain  Continue with low-dose narcotics  Can try Tylenol first and use narcotic if pain not controlled  Continue to monitor for oversedation and mental status changes 6. DVT/PE prophylaxis  Lovenox for 17 more days 7. FEN  Regular diet  DC Foley and central line 8. acute blood loss anemia  Stable  Check CBC in one to 2 days 9. Disposition  Discharge to skilled nursing facility today  Followup with orthopedics in 10-14 days   Mearl Latin, PA-C [facility] [phone] (P) 02/10/2012, 9:36 AM

## 2012-02-10 NOTE — Discharge Summary (Signed)
ORTHOPAEDIC TRAUMA SERVICE DISCHARGE SUMMARY  Patient ID: Peggy Jennings MRN: [id number] DOB/AGE: 66-Nov-1947 66 y.o.  Admit date: 02/05/2012 Discharge date: 02/10/2012  Admission Diagnoses: Right distal femur periprosthetic fracture Status post revision right total knee arthroplasty Hypertension  Discharge Diagnoses:  Principal Problem:  *Periprosthetic fracture around internal prosthetic right knee joint Active Problems:  Status post revision of total knee replacement  HTN (hypertension)  Fibromyalgia  GERD (gastroesophageal reflux disease)  Procedures performed: Open reduction and internal fixation of right distal femur periprosthetic fracture on 02/06/2012  Discharged Condition: stable  Hospital Course:  Peggy Jennings is a 66 year old Caucasian female that is approximately 2 weeks status post a revision of her right total knee arthroplasty. On postoperative day #1 following her right total knee revision she sustained a fall in the hospital that resulted in her right distal femur periprosthetic fracture. Initially the fracture was nondisplaced and so that he can be treated nonoperatively with observation. However on followup there was gross displacement of her fracture and as such the orthopedic trauma service was contacted regarding this injury for fixation. Peggy Jennings was directly admitted from the skilled nursing facility to [facility] on 02/05/2012 and surgery was performed on 02/06/2012. Patient's hospital stay was relatively uncomplicated. After surgery the patient's family did request a CT scan of the patient's head from her fall that she sustained 2 weeks ago as he stated she was behaving quite erratically and was not "normal". Based on interactions with the patient preoperatively as well as my first interaction on postoperative day #1 I felt that this was not a necessary radiological intervention as the patient was a behaving quite appropriately for me. I could not find  any focal neurologic changes over atypical behaviors that would warrant a CT scan for evaluation. Did review patient's current medications and it was possible that her atypical behavior was secondary to numerous medications that she was on. As such we have titrated her pain medications accordingly and the patient is much better since that time. Again patient's hospital stay was relatively uncomplicated. She was working with physical therapy on postoperative day #1 and was progressing slowly but doing very well. Patient is very much concerned about falling once again and acknowledges that this is more psychological for her physical. On postoperative day #2 a dressing change was performed. Her wounds are very stable. No issues were noted. Patient continued to progress very well with physical therapy and ultimately on postoperative day #4 patient deemed stable for discharge to skilled nursing facility. Patient is tolerating diet and has had a bowel movement prior to discharge and central line have been discontinued as well prior to transfer to the skilled nursing facility.  Consults: None  Significant Diagnostic Studies:  CBC    Component Value Date/Time   WBC 9.1 02/08/2012 0500   RBC 3.15* 02/08/2012 0500   HGB 9.4* 02/08/2012 0500   HCT 28.5* 02/08/2012 0500   PLT 266 02/08/2012 0500   MCV 90.5 02/08/2012 0500   MCH 29.8 02/08/2012 0500   MCHC 33.0 02/08/2012 0500   RDW 14.8 02/08/2012 0500   LYMPHSABS 2.2 02/05/2012 1613   MONOABS 0.8 02/05/2012 1613   EOSABS 0.6 02/05/2012 1613   BASOSABS 0.0 02/05/2012 1613    BMET    Component Value Date/Time   NA 139 02/08/2012 0500   K 3.6 02/08/2012 0500   CL 105 02/08/2012 0500   CO2 28 02/08/2012 0500   GLUCOSE 111* 02/08/2012 0500   BUN 8 02/08/2012 0500  CREATININE 0.53 02/08/2012 0500   CALCIUM 8.4 02/08/2012 0500   GFRNONAA >90 02/08/2012 0500   GFRAA >90 02/08/2012 0500   25-hydroxy vitamin D level: 43 ng/mL  Patient Vitals for the past 24 hrs:  BP  Temp Pulse Resp SpO2  02/10/12 0528 164/65 mmHg 98.5 F (36.9 C) 86  18  94 %  02/09/12 2104 151/67 mmHg 98.1 F (36.7 C) 84  18  100 %  02/09/12 1355 149/72 mmHg 97.9 F (36.6 C) 73  18  100 %     Treatments: IV hydration, antibiotics: Ancef, analgesia: Morphine and IV Tylenol, Norco, OxyIR anticoagulation: LMW heparin, therapies: PT, OT, RN and SW and surgery: As above  Discharge Exam: Subjective:  4 Days Post-Op Procedure(s) (LRB):  OPEN REDUCTION INTERNAL FIXATION (ORIF) DISTAL FEMUR FRACTURE (Right)  Doing well and ready to go to SNF  Nervous that she will fall again  Denies CP  No SOB  No n/v  Objective:  Current Vitals  Blood pressure 164/65, pulse 86, temperature 98.5 F (36.9 C), temperature source Oral, resp. rate 18, height 5\' 6"  (1.676 m), weight 108.41 kg (239 lb), SpO2 94.00%.  Vital signs in last 24 hours:  Temp: [97.9 F (36.6 C)-98.5 F (36.9 C)] 98.5 F (36.9 C) (03/18 0528)  Pulse Rate: [73-86] 86 (03/18 0528)  Resp: [18] 18 (03/18 0528)  BP: (149-164)/(65-72) 164/65 mmHg (03/18 0528)  SpO2: [94 %-100 %] 94 % (03/18 0528)  Intake/Output from previous day:  03/17 0701 - 03/18 0700  In: 2486.3 [P.O.:1320; I.V.:1166.3]  Out: 4875 [Urine:4875]  Intake/Output  03/17 0701 - 03/18 0700 03/18 0701 - 03/19 0700  P.O. 1320  I.V. (mL/kg) 1166.3 (10.8)  Total Intake(mL/kg) 2486.3 (22.9)  Urine (mL/kg/hr) 4875 (1.9)  Total Output 4875  Net -2388.7   LABS   Basename  02/08/12 0500   HGB  9.4*     Basename  02/08/12 0500   WBC  9.1   RBC  3.15*   HCT  28.5*   PLT  266     Basename  02/08/12 0500   NA  139   K  3.6   CL  105   CO2  28   BUN  8   CREATININE  0.53   GLUCOSE  111*   CALCIUM  8.4    No results found for this basename: LABPT:2,INR:2 in the last 72 hours  Physical Exam  Gen: No acute distress  Lungs: Clear  Cardiac: Regular  Abd: Nontender with positive bowel sounds  Ext: Right lower extremity  Dressing is clean dry and intact   Distal motor and sensory functions are intact  Swelling is well-controlled  Extremity is warm  Patient is able to achieve what appeared to be full extension of her right knee  Palpable dorsalis pedis pulse is appreciated  Imaging  No results found.  Assessment/Plan:  4 Days Post-Op Procedure(s) (LRB):  OPEN REDUCTION INTERNAL FIXATION (ORIF) DISTAL FEMUR FRACTURE (Right)  66 year old female status post right distal femur periprosthetic fracture postoperative day #4  1. Right periprosthetic distal femur fracture, postoperative day 4  Nonweightbearing right lower extremity for the next 6-8 weeks  range of motion as tolerated  Dressing changes as needed  Ice and elevate  2. Hypertension  Fair control  Continue with home meds  3. GERD  Home meds  4. Fibromyalgia  Home medications  5. Pain  Continue with low-dose narcotics  Can try Tylenol first and use narcotic if pain not controlled  Continue to monitor for oversedation and mental status changes  6. DVT/PE prophylaxis  Lovenox for 17 more days  7. FEN  Regular diet  DC Foley and central line  8. acute blood loss anemia  Stable  Check CBC in one to 2 days  9. Disposition  Discharge to skilled nursing facility today  Followup with orthopedics in 10-14 days   Disposition: 03-Skilled Nursing Facility   Medication List  As of 02/10/2012  9:54 AM   STOP taking these medications         meloxicam 7.5 MG tablet      oxyCODONE 10 MG 12 hr tablet         TAKE these medications         acetaminophen 325 MG tablet   Commonly known as: TYLENOL   Take 1-2 tablets (325-650 mg total) by mouth every 6 (six) hours as needed.      cloNIDine 0.1 MG tablet   Commonly known as: CATAPRES   Take 0.1 mg by mouth 2 (two) times daily.      diltiazem 240 MG 24 hr capsule   Commonly known as: DILACOR XR   Take 240 mg by mouth daily.      DSS 100 MG Caps   Take 100 mg by mouth 2 (two) times daily.      DULoxetine 60 MG capsule    Commonly known as: CYMBALTA   Take 60 mg by mouth at bedtime.      enoxaparin 40 MG/0.4ML injection   Commonly known as: LOVENOX   Inject 0.4 mLs (40 mg total) into the skin at bedtime.      estrogens (conjugated) 0.45 MG tablet   Commonly known as: PREMARIN   Take 0.45 mg by mouth at bedtime. Take daily for 21 days then do not take for 7 days.      fluticasone 50 MCG/ACT nasal spray   Commonly known as: FLONASE   Place 1 spray into the nose daily.      HYDROcodone-acetaminophen 10-325 MG per tablet   Commonly known as: NORCO   Take 1-2 tablets by mouth every 6 (six) hours as needed for pain (start with 1 and if pain uncontrolled after 2 hours may take another. do not exceed 2 tablets for 6 hours).      methocarbamol 500 MG tablet   Commonly known as: ROBAXIN   Take 1 tablet (500 mg total) by mouth every 6 (six) hours as needed.      mulitivitamin with minerals Tabs   Take 1 tablet by mouth daily.      omeprazole 20 MG tablet   Commonly known as: PRILOSEC OTC   Take 20 mg by mouth daily.      oxyCODONE 5 MG immediate release tablet   Commonly known as: Oxy IR/ROXICODONE   Take 1 tablet (5 mg total) by mouth every 3 (three) hours as needed for pain (severe breakthrough pain only).      pravastatin 80 MG tablet   Commonly known as: PRAVACHOL   Take 80 mg by mouth at bedtime.      ramipril 10 MG tablet   Commonly known as: ALTACE   Take 10 mg by mouth 2 (two) times daily.      SAVELLA 50 MG Tabs   Generic drug: Milnacipran   Take 50 mg by mouth 2 (two) times daily.      telmisartan 80 MG tablet   Commonly known as: MICARDIS   Take 80  mg by mouth daily.      VITAMIN D (CHOLECALCIFEROL) PO   Take 5,000 Units by mouth at bedtime.           Follow-up Information    Follow up with HANDY,MICHAEL H, MD in 10 days. (call for appointment)    Contact information:   [address], [address] [place] [address] [phone]         Discharge  instructions and plan Mrs. Pint has sustained a significant injury around a total knee revision. We are hopeful that she'll go on and heal uneventfully as to achieve excellent fixation. She will be nonweightbearing for the next 6-8 weeks with graduated weightbearing thereafter. She will not have any range of motion restrictions for her right knee at this time. It is imperative for her to participate with physical therapy on a daily basis. She will need some encouragement as far as mobilization as she is somewhat fearful that she will fall again. Patient can have a daily dressing changes performed to her right leg. I would also recommend that she hadn't TED hose applied for swelling control as well as mechanical source for DVT prevention. Please see discharge instructions and wound care instruction sheet should be included with the patient work. History will also be on Lovenox for another 17 days to cover for DVT/PE. Patient can resume a diet as tolerated. We will also continue to titrate her pain medications as well. Continue with low-dose narcotics and even using a regular Tylenol for pain control at this time. It does appear that the patient is in to stronger narcotics in the past. I would like to see the patient back in the office in 10-14 days for reevaluation and followup x-rays. I will likely discontinue her sutures at that time as well. Should the nursing facility have any questions regarding wound care and were therapy to contact the office at [phone]  Signed:  Mearl Latin, PA-C [facility] [phone] (P) 02/10/2012, 9:54 AM

## 2012-02-10 NOTE — Progress Notes (Signed)
Clinical Social Work-CSW facilitated pt d/c to [facility] with d/c packet, [facility] transport and [facility] authorization-no further needs at this time-Caitland Porchia-MSW, [phone]

## 2012-02-11 ENCOUNTER — Encounter (HOSPITAL_COMMUNITY): Payer: Self-pay | Admitting: Orthopedic Surgery

## 2013-01-21 DIAGNOSIS — M25569 Pain in unspecified knee: Secondary | ICD-10-CM

## 2013-01-21 HISTORY — DX: Pain in unspecified knee: M25.569

## 2013-02-09 IMAGING — CR DG FEMUR 2+V*R*
4 series · 4 of 4 positions shown · non-contrast
Comparison: Radiographs dated 02/05/2012

CLINICAL DATA: Right femur fracture.

RIGHT FEMUR - 2 VIEW

[view not recorded (1 of 4)]
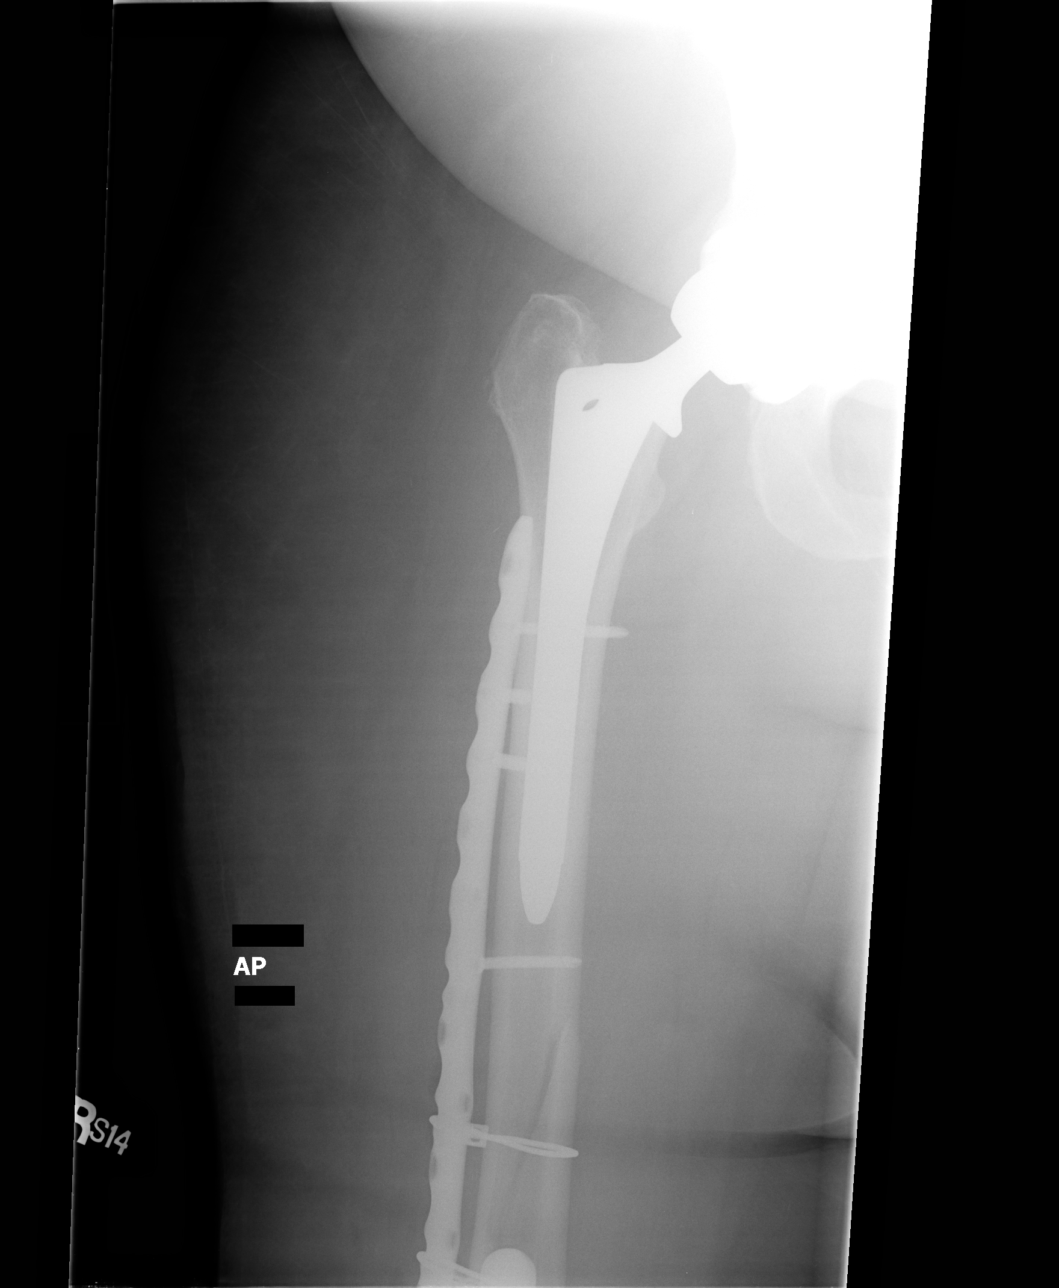

[view not recorded (2 of 4)]
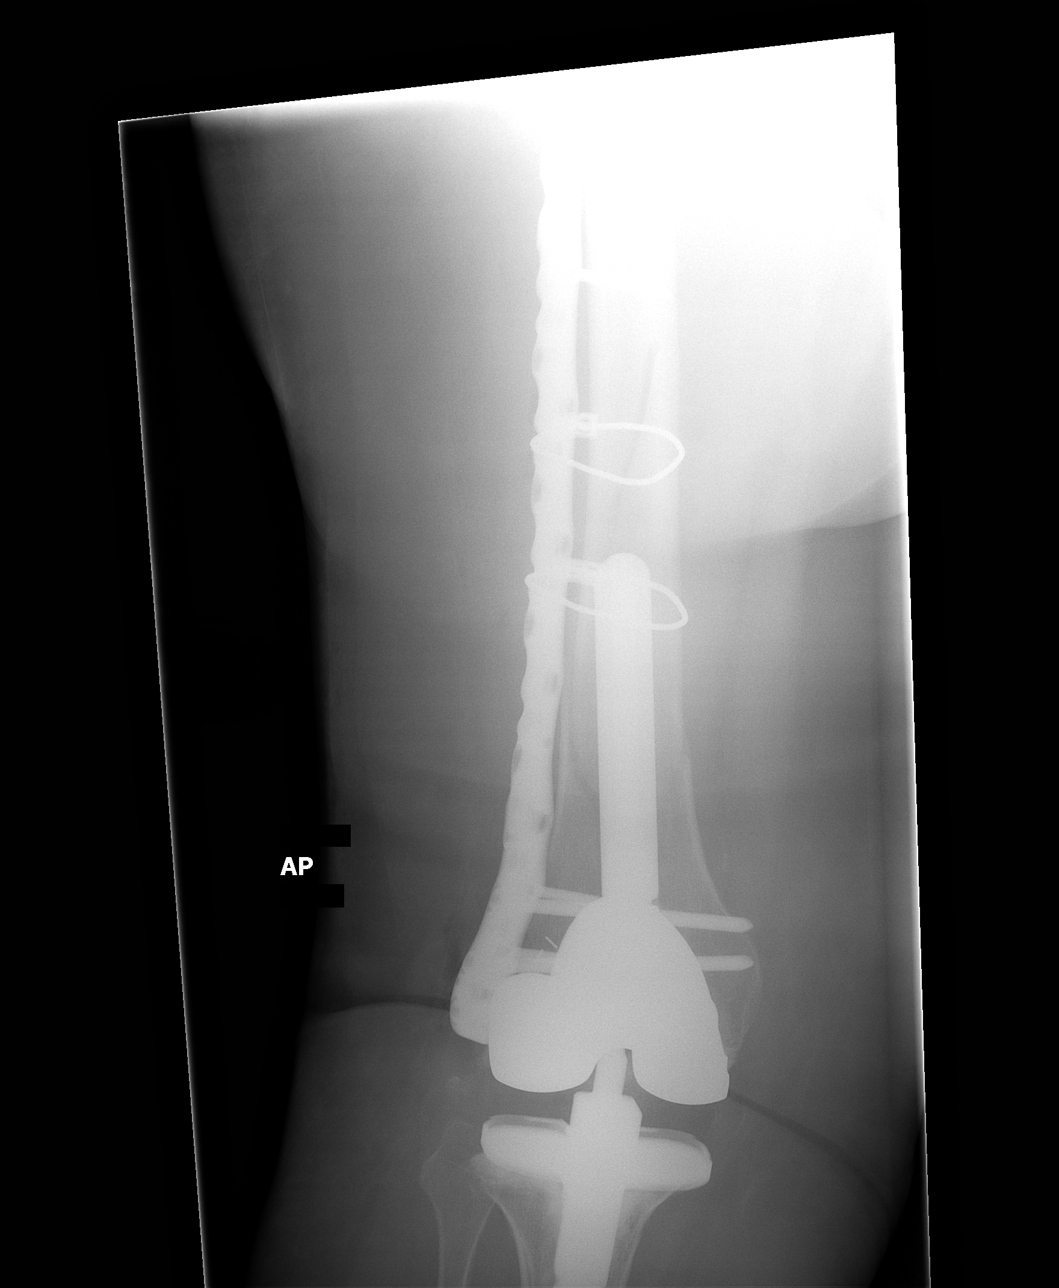

[view not recorded (3 of 4)]
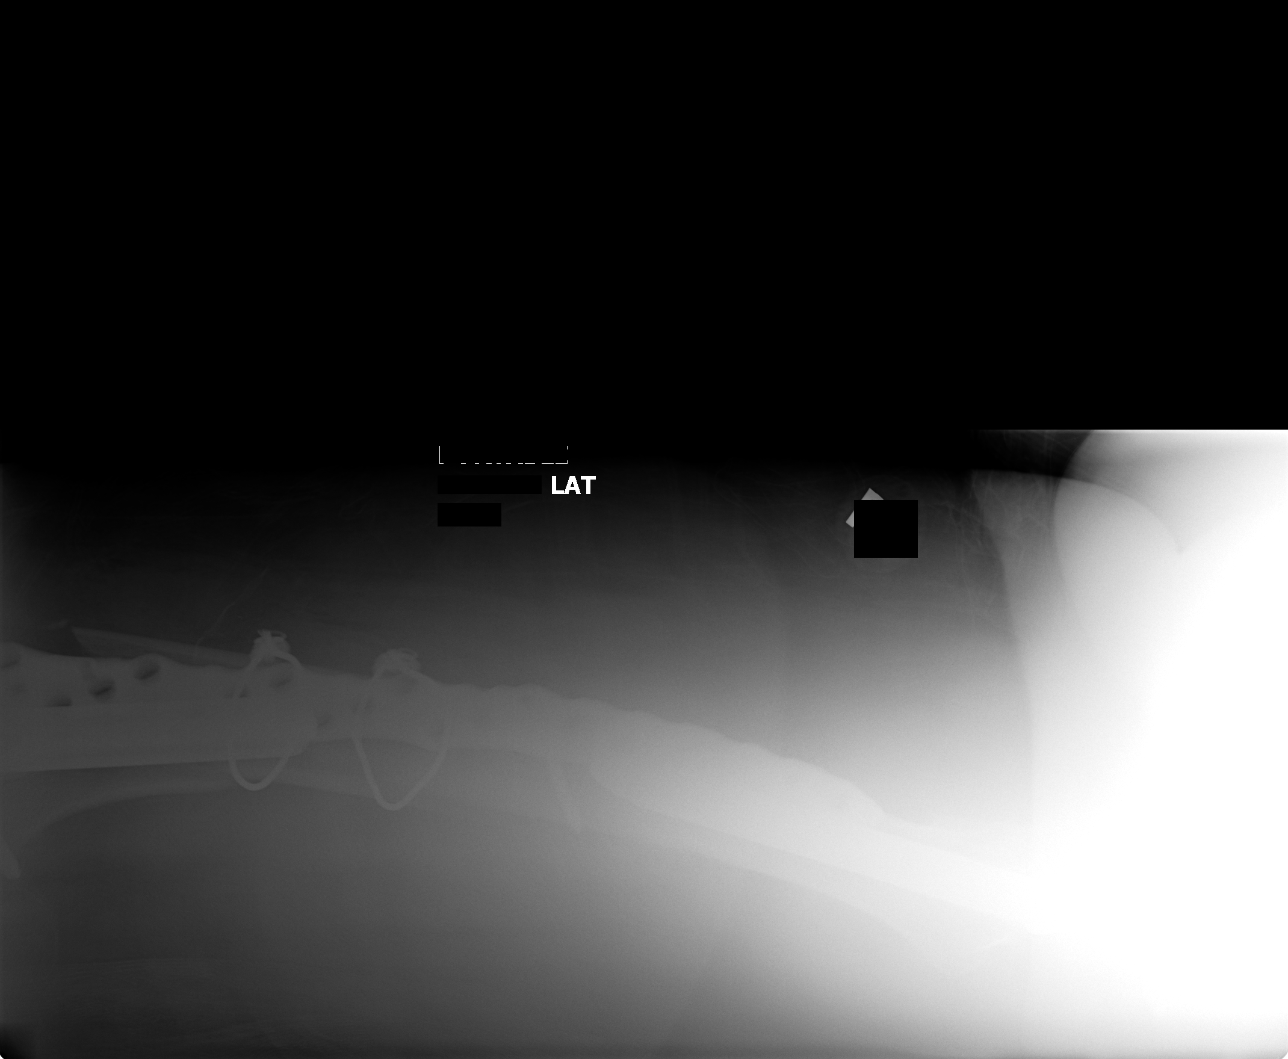

[view not recorded (4 of 4)]
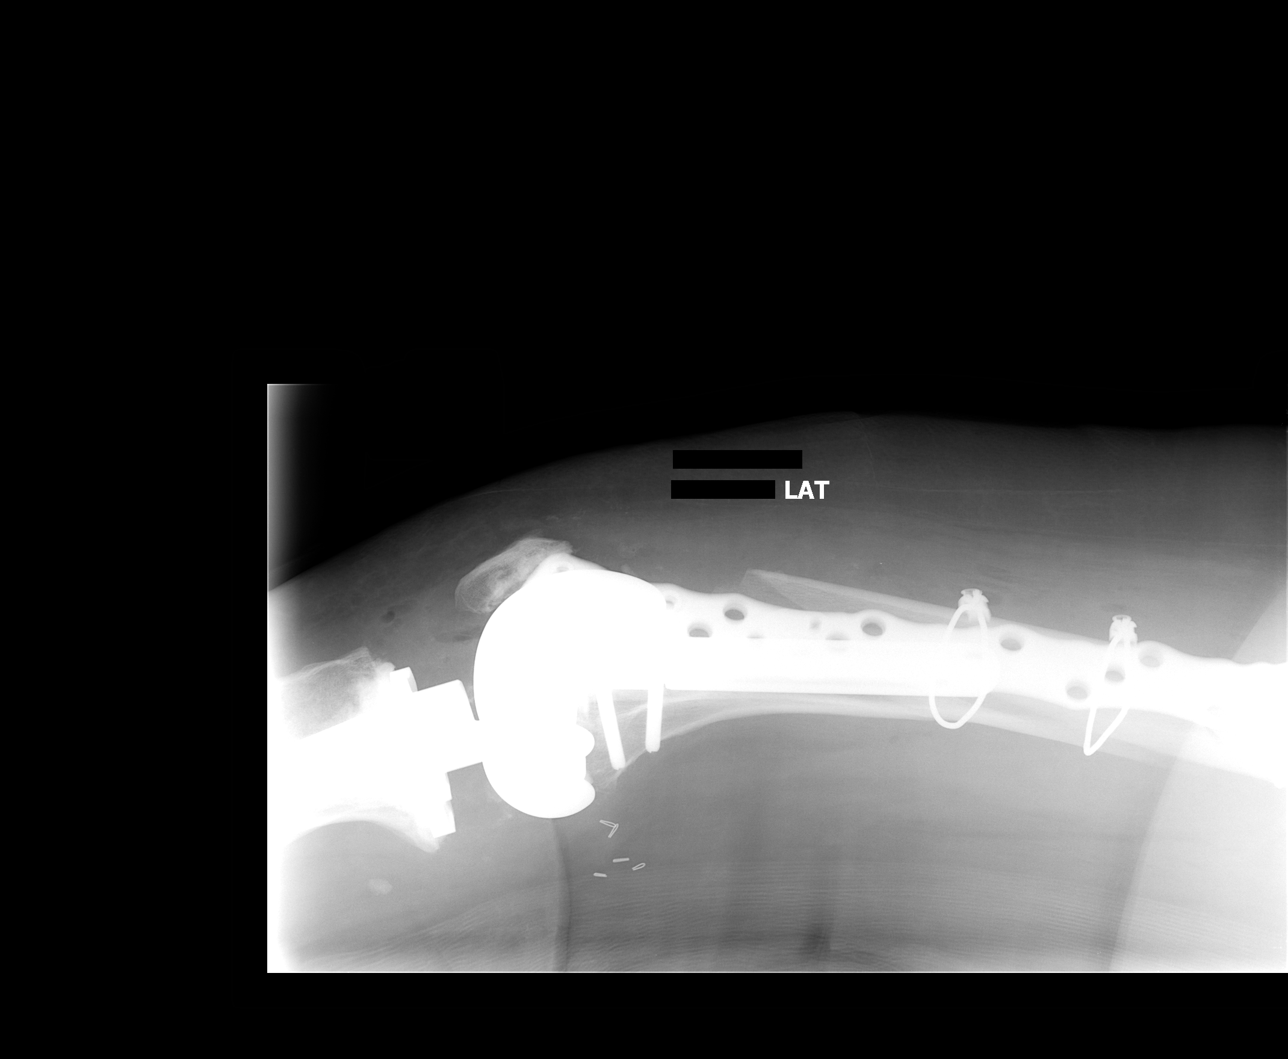

[4 of 4 positions shown; findings below may reference images not displayed]

FINDINGS: The patient has undergone insertion of a long side plate
on the right femur with multiple cerclage wires around the long
spiral fracture of the femoral shaft.  Position is near anatomic in
the AP projection.  There is slight displacement and angulation of
the distal portion of the fracture on the lateral view.  However,
this is much improved over the preoperative study.
IMPRESSION: Improvement of alignment and position of the spiral fracture of the
femoral shaft after open reduction and internal fixation.

## 2014-01-03 DIAGNOSIS — M25561 Pain in right knee: Secondary | ICD-10-CM

## 2014-01-03 HISTORY — DX: Pain in right knee: M25.561

## 2014-03-22 ENCOUNTER — Ambulatory Visit (INDEPENDENT_AMBULATORY_CARE_PROVIDER_SITE_OTHER): Payer: Medicare Other

## 2014-03-22 ENCOUNTER — Encounter: Payer: Self-pay | Admitting: Podiatrist

## 2014-03-22 ENCOUNTER — Ambulatory Visit (INDEPENDENT_AMBULATORY_CARE_PROVIDER_SITE_OTHER): Payer: Medicare Other | Admitting: Podiatrist

## 2014-03-22 VITALS — BP 126/56 | HR 64 | Resp 18

## 2014-03-22 DIAGNOSIS — R52 Pain, unspecified: Secondary | ICD-10-CM

## 2014-03-22 DIAGNOSIS — M7660 Achilles tendinitis, unspecified leg: Secondary | ICD-10-CM

## 2014-03-22 NOTE — Progress Notes (Signed)
   Subjective:    Patient ID: Peggy Jennings, female    DOB: [birth year], 68 y.o.   MRN: [id number]  HPI I saw Dr Peggy Jennings and he stated that she needed a boot and wore it for two weeks and it hurt my back and hips and my knee and foot and burns and throbs and swells and it hurts on back of heel and Dr. Gretta Jennings got an x-ray of my left foot    Review of Systems  HENT:       Ringing in ears  Musculoskeletal:       Joint pain  Hematological: Bruises/bleeds easily.  All other systems reviewed and are negative.      Objective:   Physical Exam GENERAL APPEARANCE: obese female with knee problems right greater than left.  Alert, conversant. Appropriately groomed. No acute distress.   VASCULAR: Pedal pulses palpable at 2/4 DP and PT bilateral.  Capillary refill time is immediate to all digits,  Proximal to distal cooling it warm to warm.  Digital hair growth is present bilateral  NEUROLOGIC: sensation is intact epicritically and protectively to 5.07 monofilament at 5/5 sites bilateral.  Light touch is intact bilateral, vibratory sensation intact bilateral, achilles tendon reflex is intact bilateral.  MUSCULOSKELETAL: pain along the posterior lateral aspect of the left heel.  Pain with direct pressure noted.  Dorsiflexion is within normal limits at 100 degrees with knee extended. Large haglunds prominence is present posterior heel left.  Right has acceptable muscle strength, tone and stability bilateral.  Intrinsic muscluature intact bilateral.  Rectus appearance of foot and digits noted bilateral.   DERMATOLOGIC: skin color, texture, and turger are within normal limits.  No preulcerative lesions are seen, no interdigital maceration noted.  No open lesions present.  Digital nails are asymptomatic. Swelling posterior and lateral heel is present left.      Assessment & Plan:  Achilles bursitis/ tendonitis  Plan:  She has tried to wear the boot but is unable because of her right knee and weight it  puts on her knees and hips.  She has tried NSAIDS and stretches with no relief.  She is ready to have the heel surgically corrected.  I recommended that we try an injection on the lateral side of the heel to see if we can calm down the inflammation and give her relief.  An injection of dexamethasone and marcaine mix was infiltrated into the lateral aspect of the left heel being careful to avoid the Achilles tendon. She was put in a ankle stabilizing brace and a prescription for a cream was called into a compounding pharmacy. I will see her back in 2 weeks and we will discuss surgery at that time if there is no improvement. Today I briefly discussed the postoperative course including nonweightbearing for 6 weeks in a cast which I think she would have a significant difficult time with due to her weight and inability to bear full weight on her right knee.

## 2014-03-22 NOTE — Patient Instructions (Signed)
Achilles Tendinitis Achilles tendinitis is inflammation of the tough, cord-like band that attaches the lower muscles of your leg to your heel (Achilles tendon). It is usually caused by overusing the tendon and joint involved.  CAUSES Achilles tendinitis can happen because of:  A sudden increase in exercise or activity (such as running).  Doing the same exercises or activities (such as jumping) over and over.  Not warming up calf muscles before exercising.  Exercising in shoes that are worn out or not made for exercise.  Having arthritis or a bone growth on the back of the heel bone. This can rub against the tendon and hurt the tendon. SIGNS AND SYMPTOMS The most common symptoms are:  Pain in the back of the leg, just above the heel. The pain usually gets worse with exercise and better with rest.  Stiffness or soreness in the back of the leg, especially in the morning.  Swelling of the skin over the Achilles tendon.  Trouble standing on tiptoe. Sometimes, an Achilles tendon tears (ruptures). Symptoms of an Achilles tendon rupture can include:  Sudden, severe pain in the back of the leg.  Trouble putting weight on the foot or walking normally. DIAGNOSIS Achilles tendinitis will be diagnosed based on symptoms and a physical examination. An X-ray may be done to check if another condition is causing your symptoms.  TREATMENT  Achilles tendinitis usually gets better over time. It can take weeks to months to heal completely. Treatment focuses on treating the symptoms and helping the injury heal. HOME CARE INSTRUCTIONS   Rest your Achilles tendon and avoid activities that cause pain.  Apply ice to the injured area:  Put ice in a plastic bag.  Place a towel between your skin and the bag.  Leave the ice on for 20 minutes, 2 3 times a day  Try to avoid using the tendon (other than gentle range of motion) while the tendon is painful. Do not resume use until instructed by your health  care provider. Then begin use gradually. Do not increase use to the point of pain. If pain does develop, decrease use and continue the above measures. Gradually increase activities that do not cause discomfort until you achieve normal use.  Do exercises to make your calf muscles stronger and more flexible. Your health care provider or physical therapist can recommend exercises for you to do.  Wrap your ankle with an elastic bandage or other wrap. This can help keep your tendon from moving too much. Your health care provider will show you how to wrap your ankle correctly.  Only take over-the-counter or prescription medicines for pain, discomfort, or fever as directed by your health care provider. SEEK MEDICAL CARE IF:   Your pain and swelling increase or pain is uncontrolled with medicines.  You develop new, unexplained symptoms or your symptoms get worse.  You are unable to move your toes or foot.  You develop warmth and swelling in your foot.  You have an unexplained temperature. MAKE SURE YOU:   Understand these instructions.  Will watch your condition.  Will get help right away if you are not doing well or get worse. Document Released: 08/21/2005 Document Revised: 09/01/2013 Document Reviewed: 06/23/2013 [employer] Patient Information 2014 [employer].  

## 2014-04-12 ENCOUNTER — Encounter: Payer: Self-pay | Admitting: Podiatrist

## 2014-04-12 ENCOUNTER — Ambulatory Visit (INDEPENDENT_AMBULATORY_CARE_PROVIDER_SITE_OTHER): Payer: Medicare Other | Admitting: Podiatrist

## 2014-04-12 VITALS — BP 118/68 | HR 66 | Resp 18

## 2014-04-12 DIAGNOSIS — M7660 Achilles tendinitis, unspecified leg: Secondary | ICD-10-CM

## 2014-04-12 NOTE — Progress Notes (Signed)
I am doing better on my left foot and the brace does help but does make my back hurt and some cramps as well  Patient presents today for follow up of achilles tendonitis of the left foot.  She relates the pain has improved but the brace is causing her pain and knee and hip issues.  She states the injection at the last visit was beneficial and overall her foot is improving slowly.  Objective:  Physical exam unchanged.  She has a large posterior heel spur on the left heel.  Swelling and pain is improving slowly.    Assessment:  Achilles tendonitis bursitis  Plan:  Recommended an wrap around ankle brace since she is unable or unwilling to wear the gauntlet style ankle brace.  She will continue to wear open back higher heeled shoes to relieve the pressure. I recommended physical therapy which she declined due to the copay.  She is a poor surgical candidate due to her weight and multiple other orthopedic issues-- would avoid a heel spur resection surgery until all options are exhausted including pt.  Will order a topical pain cream as well for her to try.

## 2014-04-12 NOTE — Patient Instructions (Addendum)
You have been prescribed a topical pain medications through a specialty compounding pharmacy called [facility] out of [place], [place]. A representative from the pharmacy will call to ensure you would like to receive the medication. If you do not hear from them within two business days please call to confirm they have received the prescription. Their telephone number is [phone].    If you have any other questions or concerns regarding the medication please call our office.  Obtain a wrap around foot/ankle brace at [facility] [facility]-- it is around $8 and gives your ankle support without being too bulky

## 2014-05-03 ENCOUNTER — Ambulatory Visit (INDEPENDENT_AMBULATORY_CARE_PROVIDER_SITE_OTHER): Payer: Medicare Other | Admitting: Podiatrist

## 2014-05-03 ENCOUNTER — Encounter: Payer: Self-pay | Admitting: Podiatrist

## 2014-05-03 VITALS — BP 137/52 | HR 75 | Resp 18

## 2014-05-03 DIAGNOSIS — M7660 Achilles tendinitis, unspecified leg: Secondary | ICD-10-CM

## 2014-05-03 DIAGNOSIS — M898X9 Other specified disorders of bone, unspecified site: Secondary | ICD-10-CM

## 2014-05-03 NOTE — Progress Notes (Signed)
"  The shot did help for about a week and it is much worse on my achilles tendon on my left foot and I can hardly walk and I didn't get the cream due to it was $80.00"  Subjective:  Patient presents today with her husband stating her heel pain and achilles tendon on the left foot are severely painful.  She has tried conservative therapies including, boot therapy, antiinflammatories, oral steriods, an injection to the heel that helped for only a week, bracing, shoe changes, stretching, icing, etc.  She states the pain is back and worse than ever.  She states she would like to have the heel fixed surgically and she is ready to have it done as soon as possible.  She denies any change in medical history.  Objective:  Neurovascular status intact and unchanged.  She has a large haglunds deformity and boney overgrowth at the posterior heel left.  She has significant tenderness in and around the posterior heel and achilles tendon insertion.  She has good range of motion with dorsiflexion with knee straight and flexed.  The large heel spur is seen radiographically with boney overgrowth noted.  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  Also discussed that she will have to be non weight bearing in a cast for 2-4 weeks and then in a boot for several more weeks during healing.  In the past she has expressed difficulty with the boot due to her right knee issues an hip problems-- she states she understands the post operative course and is willing to comply with instructions.  She has a walker and a wheel chair at home from her previous orthopedic procedures and would like to have the surgery performed.  The risks were explained to her as well and the surgical consent was discussed.  Risks include but not limited to, excessive swelling, nerve problems, blood clot, infection, continued pain, pain at incision site, decreased  range of motion, etc.  Questions were encouraged and answered to the best of my ability.  She will be seen at [facility] specialty surgical center on an outpatient basis for the procedure and application of below knee cast.  If she has any concerns or questions prior to the procedure she is told to call.

## 2014-05-09 DIAGNOSIS — M7660 Achilles tendinitis, unspecified leg: Secondary | ICD-10-CM

## 2014-05-09 DIAGNOSIS — S96999A Other specified injury of unspecified muscle and tendon at ankle and foot level, unspecified foot, initial encounter: Secondary | ICD-10-CM

## 2014-05-09 DIAGNOSIS — S91309A Unspecified open wound, unspecified foot, initial encounter: Secondary | ICD-10-CM

## 2014-05-09 DIAGNOSIS — M898X9 Other specified disorders of bone, unspecified site: Secondary | ICD-10-CM

## 2014-05-10 ENCOUNTER — Telehealth: Payer: Self-pay

## 2014-05-10 NOTE — Telephone Encounter (Signed)
Spoke with pt regarding post op status. She states that her pain in manageable. Denies any issues or concerns at the moment. Advised to continue with ice and elevation and to call with questions or concerns.

## 2014-05-16 ENCOUNTER — Ambulatory Visit (INDEPENDENT_AMBULATORY_CARE_PROVIDER_SITE_OTHER): Payer: Medicare Other

## 2014-05-16 ENCOUNTER — Encounter: Payer: Self-pay | Admitting: Podiatrist

## 2014-05-16 ENCOUNTER — Ambulatory Visit (INDEPENDENT_AMBULATORY_CARE_PROVIDER_SITE_OTHER): Payer: Medicare Other | Admitting: Podiatrist

## 2014-05-16 VITALS — BP 141/70 | HR 67 | Resp 18

## 2014-05-16 DIAGNOSIS — S93499A Sprain of other ligament of unspecified ankle, initial encounter: Secondary | ICD-10-CM

## 2014-05-16 DIAGNOSIS — Z09 Encounter for follow-up examination after completed treatment for conditions other than malignant neoplasm: Secondary | ICD-10-CM

## 2014-05-16 DIAGNOSIS — S86012D Strain of left Achilles tendon, subsequent encounter: Secondary | ICD-10-CM

## 2014-05-16 DIAGNOSIS — M928 Other specified juvenile osteochondrosis: Secondary | ICD-10-CM

## 2014-05-16 DIAGNOSIS — S96819A Strain of other specified muscles and tendons at ankle and foot level, unspecified foot, initial encounter: Secondary | ICD-10-CM

## 2014-05-16 DIAGNOSIS — Z5189 Encounter for other specified aftercare: Secondary | ICD-10-CM

## 2014-05-16 DIAGNOSIS — M9262 Juvenile osteochondrosis of tarsus, left ankle: Secondary | ICD-10-CM

## 2014-05-16 NOTE — Progress Notes (Signed)
I HAD SURGERY ON 05/09/14 ON MY LEFT FOOT AND I HAVE BEEN TAKEN THE MEDICINE AND IT HAS GOT ME CONSTIPATED AND I AM NOT TAKEN THEM ANY MORE AND MY FOOT DOESN'T HURT  Subjective: Peggy Jennings presents today for her one week followup status post repair of Achilles tendon and removal of posterior calcaneal exostosis all on the left foot. She states she took the hydrocodone as instructed however she became constipated. She has not taken the medication since Thursday in states she's done well. She denies any calf pain or tenderness. Denies any uncomfortable rubbing or pain in the past. Overall she states she's doing well and she is getting along okay in her wheelchair.  Objective: Excellent appearance of the foot is seen with capillary refill time within normal limits. The cast is in excellent shape it is not dirty it is intact it  does not show sign of wear or wetness. No calf pain or tenderness is present. X-rays are taken reveal removal of bony exostosis.  Assessment: Status post Achilles tendon repair and removal of bony exostosis left heel  Plan: We will keep the cast intact for one more week. At that time and new cast will be placed on the foot. And the staples will be removed if they're ready at that time. If she has any questions or concerns prior that visit she will call.

## 2014-05-24 ENCOUNTER — Ambulatory Visit (INDEPENDENT_AMBULATORY_CARE_PROVIDER_SITE_OTHER): Payer: Medicare Other | Admitting: Podiatrist

## 2014-05-24 ENCOUNTER — Encounter: Payer: Self-pay | Admitting: Podiatrist

## 2014-05-24 VITALS — BP 126/69 | HR 72 | Resp 20

## 2014-05-24 DIAGNOSIS — M9262 Juvenile osteochondrosis of tarsus, left ankle: Secondary | ICD-10-CM

## 2014-05-24 DIAGNOSIS — M928 Other specified juvenile osteochondrosis: Secondary | ICD-10-CM

## 2014-05-24 DIAGNOSIS — Z09 Encounter for follow-up examination after completed treatment for conditions other than malignant neoplasm: Secondary | ICD-10-CM

## 2014-05-24 NOTE — Progress Notes (Signed)
Subjective: Patient is in today with her husband's to his weeks status post Achilles tendon repair and removal of Haglund's deformity posterior aspect of the left heel. She states she's doing well and that the cast has been comfortable. She denies any nausea, vomiting, calf pain or tenderness. Overall she states she's doing well and has required no pain medication lately.  Objective: Her cast is removed at today's visit and the staples are also removed. The incision site is healing well and is coapted nicely. Range of motion is excellent within the left ankle and calf. Dorsiflexion is to 90. Good plantar flexion against resistance is also noted. Neurovascular status is intact and unchanged.  Assessment: Status post Achilles tendon repair, repair of Haglund's deformity with suture bridge.  Plan: A new cast was applied at today's visit. I will see her back in 2 weeks and we will remove the cast at that point. We will get her into her boot at that time. She is to continue using the wheelchair for the time being.

## 2014-06-07 ENCOUNTER — Encounter: Payer: Self-pay | Admitting: Podiatrist

## 2014-06-07 ENCOUNTER — Ambulatory Visit (INDEPENDENT_AMBULATORY_CARE_PROVIDER_SITE_OTHER): Payer: Medicare Other | Admitting: Podiatrist

## 2014-06-07 VITALS — BP 120/56 | HR 72 | Resp 18

## 2014-06-07 DIAGNOSIS — M9262 Juvenile osteochondrosis of tarsus, left ankle: Secondary | ICD-10-CM

## 2014-06-07 DIAGNOSIS — M928 Other specified juvenile osteochondrosis: Secondary | ICD-10-CM

## 2014-06-07 DIAGNOSIS — Z09 Encounter for follow-up examination after completed treatment for conditions other than malignant neoplasm: Secondary | ICD-10-CM

## 2014-06-07 DIAGNOSIS — S93499A Sprain of other ligament of unspecified ankle, initial encounter: Secondary | ICD-10-CM

## 2014-06-07 DIAGNOSIS — S96819A Strain of other specified muscles and tendons at ankle and foot level, unspecified foot, initial encounter: Secondary | ICD-10-CM

## 2014-06-07 DIAGNOSIS — S86012D Strain of left Achilles tendon, subsequent encounter: Secondary | ICD-10-CM

## 2014-06-07 DIAGNOSIS — Z5189 Encounter for other specified aftercare: Secondary | ICD-10-CM

## 2014-06-07 MED ORDER — LIDOCAINE 5 % EX PTCH: 1.0000 | MEDICATED_PATCH | CUTANEOUS | Status: DC

## 2014-06-07 NOTE — Progress Notes (Signed)
Subjective: Patient is in today with her husband's 4 weeks status post Achilles tendon repair and removal of Haglund's deformity posterior aspect of the left heel. She states she's doing well and that the cast has been comfortable. She denies any nausea, vomiting, calf pain or tenderness. Overall she states she's doing well and has required no pain medication lately.   Objective: Her cast is removed at today's visit . The incision site is healing well and is coapted nicely. Range of motion is excellent within the left ankle and calf. Dorsiflexion is to 90. Good plantar flexion against resistance is also noted. Neurovascular status is intact and unchanged. No calf pain with range of motion of squeeze test.    Assessment: Status post Achilles tendon repair, repair of Haglund's deformity with suture bridge.  Plan: the case was removed.  She will wear her boot with 50%  touch down for 2 weeks.  I will see her back in 2 weeks to recheck and increase her activity as indicated.

## 2014-06-07 NOTE — Patient Instructions (Signed)
Wear your boot at all times when up-- use walker for assistance-- you can put up to 50% of your body weight on the foot.  You may get the foot wet.  Don't do any water exercises quite yet.. I'll see you again in 2 weeks

## 2014-06-07 NOTE — Progress Notes (Signed)
° °  Subjective:    Patient ID: Peggy Jennings, female    DOB: Feb 18, 1946, 68 y.o.   MRN: [id number]  HPI I AM DOING A LOT BETTER ON MY LEFT FOOT THAN I WAS ABOUT A MONTH AGO    Review of Systems     Objective:   Physical Exam        Assessment & Plan:

## 2014-06-10 ENCOUNTER — Telehealth: Payer: Self-pay | Admitting: *Deleted

## 2014-06-10 NOTE — Telephone Encounter (Signed)
Calling about Lidoderm prescription.  [facility] said they sent you an authorization to fill out and send to my insurance company.  I really need that medicine.  I don't take pain medication.  Please give me a call.

## 2014-06-15 NOTE — Telephone Encounter (Signed)
Called pt we had not received fax in [place].  Received fax in [place] called [place] and started the authorization process.  mparry CNA

## 2014-06-15 NOTE — Telephone Encounter (Signed)
153pm  Authorization form faxed back to [facility]  CNA

## 2014-06-17 ENCOUNTER — Telehealth: Payer: Self-pay | Admitting: *Deleted

## 2014-06-17 NOTE — Telephone Encounter (Signed)
Received phone call from [facility] that they will not cover the prescription.  [employer] notified the patient  mparry CNA

## 2014-06-17 NOTE — Telephone Encounter (Signed)
Calling regarding medication authorization for Lidoderm.  Not covered by Medicare Part D because not requested for FDA approval.  Patient was notified.  Call with questions.

## 2014-06-21 ENCOUNTER — Ambulatory Visit (INDEPENDENT_AMBULATORY_CARE_PROVIDER_SITE_OTHER): Payer: Medicare Other | Admitting: Podiatrist

## 2014-06-21 VITALS — BP 148/72 | HR 69 | Resp 18

## 2014-06-21 DIAGNOSIS — M9262 Juvenile osteochondrosis of tarsus, left ankle: Secondary | ICD-10-CM

## 2014-06-21 DIAGNOSIS — S86012D Strain of left Achilles tendon, subsequent encounter: Secondary | ICD-10-CM

## 2014-06-21 DIAGNOSIS — M928 Other specified juvenile osteochondrosis: Secondary | ICD-10-CM

## 2014-06-21 DIAGNOSIS — Z5189 Encounter for other specified aftercare: Secondary | ICD-10-CM

## 2014-06-21 DIAGNOSIS — Z09 Encounter for follow-up examination after completed treatment for conditions other than malignant neoplasm: Secondary | ICD-10-CM

## 2014-06-21 NOTE — Progress Notes (Signed)
Subjective: Patient is in today with her husband's 6 weeks status post Achilles tendon repair and removal of Haglund's deformity posterior aspect of the left heel. She states she's doing well and that the boot has been doing well. Her right knee hurts some but no more than usual. She denies any nausea, vomiting, calf pain or tenderness. Overall she states she's doing well and has required no pain medication lately.   Objective: The incision site is healing well and is coapted nicely. Range of motion is excellent within the left ankle and calf. Dorsiflexion is to 90. Good plantar flexion against resistance is also noted. Neurovascular status is intact and unchanged. No calf pain with range of motion of squeeze test.   Assessment: Status post Achilles tendon repair, repair of Haglund's deformity with suture bridge. 05-09-2014  Plan:  She will wear her short air fracture boot for the next 4 weeks.  I will recheck in 4 weeks and will increase activity at that visit if able.

## 2014-07-19 ENCOUNTER — Encounter: Payer: Medicare Other | Admitting: Podiatrist

## 2014-07-26 ENCOUNTER — Encounter: Payer: Self-pay | Admitting: Podiatrist

## 2014-07-26 ENCOUNTER — Ambulatory Visit (INDEPENDENT_AMBULATORY_CARE_PROVIDER_SITE_OTHER): Payer: Medicare Other | Admitting: Podiatrist

## 2014-07-26 ENCOUNTER — Ambulatory Visit (INDEPENDENT_AMBULATORY_CARE_PROVIDER_SITE_OTHER): Payer: Medicare Other

## 2014-07-26 VITALS — BP 156/66 | HR 62 | Resp 12

## 2014-07-26 DIAGNOSIS — Z09 Encounter for follow-up examination after completed treatment for conditions other than malignant neoplasm: Secondary | ICD-10-CM

## 2014-07-26 DIAGNOSIS — M928 Other specified juvenile osteochondrosis: Secondary | ICD-10-CM

## 2014-07-26 DIAGNOSIS — M9262 Juvenile osteochondrosis of tarsus, left ankle: Secondary | ICD-10-CM

## 2014-07-26 DIAGNOSIS — M898X9 Other specified disorders of bone, unspecified site: Secondary | ICD-10-CM

## 2014-07-26 NOTE — Progress Notes (Signed)
Subjective: Patient is in today with her husband's 10 weeks status post Achilles tendon repair and removal of Haglund's deformity posterior aspect of the left heel. She states she's doing well and that the boot has been doing well. Her right knee hurts some but no more than usual. She denies any nausea, vomiting, calf pain or tenderness. Overall she states she's doing well   Objective: The incision site is healing well and is coapted nicely. Range of motion is excellent within the left ankle and calf. Dorsiflexion is to 90. Good plantar flexion against resistance is also noted. Neurovascular status is intact and unchanged. No calf pain with range of motion of squeeze test.  Excellent post op appearance of the xrays also seen.   Assessment: Status post Achilles tendon repair, repair of Haglund's deformity with suture bridge. 05-09-2014   Plan: She's comfortable in her good supportive sneakers and sandals and a brace. Recommended that she continue to wear the base for the next 3 months as a tendon get stronger and stronger. She may return to her exercise and she is to avoid any jumping or ballistic activities. She is also to be careful on steps. At this point she is discharged from her postoperative course and if any problems arise in the future she will call.

## 2015-04-26 ENCOUNTER — Ambulatory Visit (INDEPENDENT_AMBULATORY_CARE_PROVIDER_SITE_OTHER): Payer: Medicare Other | Admitting: Podiatry

## 2015-04-26 ENCOUNTER — Encounter: Payer: Self-pay | Admitting: Podiatry

## 2015-04-26 ENCOUNTER — Ambulatory Visit (INDEPENDENT_AMBULATORY_CARE_PROVIDER_SITE_OTHER): Payer: Medicare Other

## 2015-04-26 VITALS — BP 157/74 | HR 89 | Resp 18

## 2015-04-26 DIAGNOSIS — Z9889 Other specified postprocedural states: Secondary | ICD-10-CM | POA: Diagnosis not present

## 2015-04-26 DIAGNOSIS — R52 Pain, unspecified: Secondary | ICD-10-CM

## 2015-04-26 DIAGNOSIS — M205X9 Other deformities of toe(s) (acquired), unspecified foot: Secondary | ICD-10-CM | POA: Diagnosis not present

## 2015-04-26 DIAGNOSIS — G5762 Lesion of plantar nerve, left lower limb: Secondary | ICD-10-CM | POA: Diagnosis not present

## 2015-04-26 DIAGNOSIS — M2020 Hallux rigidus, unspecified foot: Secondary | ICD-10-CM

## 2015-04-28 NOTE — Progress Notes (Signed)
Subjective:     Patient ID: Peggy Jennings, female   DOB: Dec 19, 1945, 69 y.o.   MRN: [id number]  HPI  She presents to the office with swelling around the back of her left heel as well as numbness along the outside of her left foot which developed following her achilles tendon surgery.  The surgery was performed by Dr. Valentina Lucks at the end of last year.  She is concerned about the swelling and numbness to the left ankle and desires an evaluation. She has history of multiple knee surgeries as well as fibromyalgia which causes an antalgic gait.   Review of Systems     Objective:   Physical Exam  Vascular findings WNL.  She has numbness along the area of the sural nerve left foot/ankle.  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  Swelling persists along the peroneal tendon left foot     Assessment:     Tendinitis  2.  Neuropraxia left foot.  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  X-ray taken.  Discussed condition with patient and told her she is a candidate for orthoses.

## 2015-07-25 ENCOUNTER — Institutional Professional Consult (permissible substitution): Payer: Self-pay | Admitting: Critical Care Medicine

## 2015-11-28 DIAGNOSIS — N1330 Unspecified hydronephrosis: Secondary | ICD-10-CM | POA: Diagnosis not present

## 2015-11-28 DIAGNOSIS — R3129 Other microscopic hematuria: Secondary | ICD-10-CM | POA: Diagnosis not present

## 2015-11-28 DIAGNOSIS — R32 Unspecified urinary incontinence: Secondary | ICD-10-CM | POA: Diagnosis not present

## 2015-11-28 DIAGNOSIS — N390 Urinary tract infection, site not specified: Secondary | ICD-10-CM | POA: Diagnosis not present

## 2015-12-08 DIAGNOSIS — N3090 Cystitis, unspecified without hematuria: Secondary | ICD-10-CM | POA: Diagnosis not present

## 2015-12-08 DIAGNOSIS — D303 Benign neoplasm of bladder: Secondary | ICD-10-CM | POA: Diagnosis not present

## 2015-12-19 DIAGNOSIS — N329 Bladder disorder, unspecified: Secondary | ICD-10-CM

## 2015-12-19 DIAGNOSIS — N1330 Unspecified hydronephrosis: Secondary | ICD-10-CM | POA: Diagnosis not present

## 2015-12-19 DIAGNOSIS — IMO0002 Reserved for concepts with insufficient information to code with codable children: Secondary | ICD-10-CM

## 2015-12-19 DIAGNOSIS — R32 Unspecified urinary incontinence: Secondary | ICD-10-CM | POA: Insufficient documentation

## 2015-12-19 DIAGNOSIS — N8110 Cystocele, unspecified: Secondary | ICD-10-CM | POA: Diagnosis not present

## 2015-12-19 HISTORY — DX: Bladder disorder, unspecified: N32.9

## 2015-12-19 HISTORY — DX: Unspecified urinary incontinence: R32

## 2015-12-19 HISTORY — DX: Reserved for concepts with insufficient information to code with codable children: IMO0002

## 2015-12-26 DIAGNOSIS — N3941 Urge incontinence: Secondary | ICD-10-CM | POA: Diagnosis not present

## 2015-12-26 DIAGNOSIS — Q625 Duplication of ureter: Secondary | ICD-10-CM | POA: Diagnosis not present

## 2015-12-26 DIAGNOSIS — M797 Fibromyalgia: Secondary | ICD-10-CM | POA: Diagnosis not present

## 2015-12-26 DIAGNOSIS — R319 Hematuria, unspecified: Secondary | ICD-10-CM | POA: Diagnosis not present

## 2015-12-26 DIAGNOSIS — I10 Essential (primary) hypertension: Secondary | ICD-10-CM | POA: Diagnosis not present

## 2015-12-26 DIAGNOSIS — R300 Dysuria: Secondary | ICD-10-CM | POA: Diagnosis not present

## 2015-12-26 DIAGNOSIS — Z79899 Other long term (current) drug therapy: Secondary | ICD-10-CM | POA: Diagnosis not present

## 2015-12-26 DIAGNOSIS — N393 Stress incontinence (female) (male): Secondary | ICD-10-CM | POA: Diagnosis not present

## 2015-12-26 DIAGNOSIS — Z90710 Acquired absence of both cervix and uterus: Secondary | ICD-10-CM | POA: Diagnosis not present

## 2015-12-26 DIAGNOSIS — N3946 Mixed incontinence: Secondary | ICD-10-CM | POA: Diagnosis not present

## 2015-12-26 DIAGNOSIS — Z9889 Other specified postprocedural states: Secondary | ICD-10-CM | POA: Diagnosis not present

## 2015-12-26 DIAGNOSIS — N329 Bladder disorder, unspecified: Secondary | ICD-10-CM | POA: Diagnosis not present

## 2015-12-26 DIAGNOSIS — N3021 Other chronic cystitis with hematuria: Secondary | ICD-10-CM | POA: Diagnosis not present

## 2015-12-26 DIAGNOSIS — E785 Hyperlipidemia, unspecified: Secondary | ICD-10-CM | POA: Diagnosis not present

## 2015-12-26 DIAGNOSIS — E039 Hypothyroidism, unspecified: Secondary | ICD-10-CM | POA: Diagnosis not present

## 2015-12-26 DIAGNOSIS — N8110 Cystocele, unspecified: Secondary | ICD-10-CM | POA: Diagnosis not present

## 2015-12-26 DIAGNOSIS — D414 Neoplasm of uncertain behavior of bladder: Secondary | ICD-10-CM | POA: Diagnosis not present

## 2015-12-26 DIAGNOSIS — N369 Urethral disorder, unspecified: Secondary | ICD-10-CM | POA: Diagnosis not present

## 2015-12-26 DIAGNOSIS — J45909 Unspecified asthma, uncomplicated: Secondary | ICD-10-CM | POA: Diagnosis not present

## 2015-12-26 DIAGNOSIS — N993 Prolapse of vaginal vault after hysterectomy: Secondary | ICD-10-CM | POA: Diagnosis not present

## 2016-01-08 DIAGNOSIS — N3946 Mixed incontinence: Secondary | ICD-10-CM | POA: Diagnosis not present

## 2016-01-11 DIAGNOSIS — N390 Urinary tract infection, site not specified: Secondary | ICD-10-CM | POA: Diagnosis not present

## 2016-01-20 DIAGNOSIS — S0091XA Abrasion of unspecified part of head, initial encounter: Secondary | ICD-10-CM | POA: Diagnosis not present

## 2016-02-20 DIAGNOSIS — R7301 Impaired fasting glucose: Secondary | ICD-10-CM | POA: Diagnosis not present

## 2016-02-20 DIAGNOSIS — I10 Essential (primary) hypertension: Secondary | ICD-10-CM | POA: Diagnosis not present

## 2016-02-20 DIAGNOSIS — E782 Mixed hyperlipidemia: Secondary | ICD-10-CM | POA: Diagnosis not present

## 2016-02-20 DIAGNOSIS — E039 Hypothyroidism, unspecified: Secondary | ICD-10-CM | POA: Diagnosis not present

## 2016-02-21 DIAGNOSIS — K5909 Other constipation: Secondary | ICD-10-CM

## 2016-02-21 DIAGNOSIS — R8271 Bacteriuria: Secondary | ICD-10-CM | POA: Diagnosis not present

## 2016-02-21 DIAGNOSIS — N3941 Urge incontinence: Secondary | ICD-10-CM | POA: Diagnosis not present

## 2016-02-21 DIAGNOSIS — J45909 Unspecified asthma, uncomplicated: Secondary | ICD-10-CM

## 2016-02-21 DIAGNOSIS — J309 Allergic rhinitis, unspecified: Secondary | ICD-10-CM | POA: Insufficient documentation

## 2016-02-21 DIAGNOSIS — K5900 Constipation, unspecified: Secondary | ICD-10-CM

## 2016-02-21 DIAGNOSIS — M1990 Unspecified osteoarthritis, unspecified site: Secondary | ICD-10-CM

## 2016-02-21 DIAGNOSIS — N329 Bladder disorder, unspecified: Secondary | ICD-10-CM | POA: Diagnosis not present

## 2016-02-21 DIAGNOSIS — E039 Hypothyroidism, unspecified: Secondary | ICD-10-CM

## 2016-02-21 HISTORY — DX: Allergic rhinitis, unspecified: J30.9

## 2016-02-21 HISTORY — DX: Other constipation: K59.09

## 2016-02-21 HISTORY — DX: Unspecified osteoarthritis, unspecified site: M19.90

## 2016-02-21 HISTORY — DX: Unspecified asthma, uncomplicated: J45.909

## 2016-02-21 HISTORY — DX: Hypothyroidism, unspecified: E03.9

## 2016-02-22 DIAGNOSIS — K219 Gastro-esophageal reflux disease without esophagitis: Secondary | ICD-10-CM | POA: Diagnosis not present

## 2016-02-22 DIAGNOSIS — I10 Essential (primary) hypertension: Secondary | ICD-10-CM | POA: Diagnosis not present

## 2016-02-22 DIAGNOSIS — J4522 Mild intermittent asthma with status asthmaticus: Secondary | ICD-10-CM | POA: Diagnosis not present

## 2016-02-22 DIAGNOSIS — E039 Hypothyroidism, unspecified: Secondary | ICD-10-CM | POA: Diagnosis not present

## 2016-02-22 DIAGNOSIS — B372 Candidiasis of skin and nail: Secondary | ICD-10-CM | POA: Diagnosis not present

## 2016-02-22 DIAGNOSIS — J3089 Other allergic rhinitis: Secondary | ICD-10-CM | POA: Diagnosis not present

## 2016-02-22 DIAGNOSIS — M1990 Unspecified osteoarthritis, unspecified site: Secondary | ICD-10-CM | POA: Diagnosis not present

## 2016-02-22 DIAGNOSIS — E782 Mixed hyperlipidemia: Secondary | ICD-10-CM | POA: Diagnosis not present

## 2016-04-03 DIAGNOSIS — N3946 Mixed incontinence: Secondary | ICD-10-CM | POA: Diagnosis not present

## 2016-04-03 DIAGNOSIS — N390 Urinary tract infection, site not specified: Secondary | ICD-10-CM | POA: Diagnosis not present

## 2016-04-17 DIAGNOSIS — L0233 Carbuncle of buttock: Secondary | ICD-10-CM | POA: Diagnosis not present

## 2016-04-19 DIAGNOSIS — L0233 Carbuncle of buttock: Secondary | ICD-10-CM | POA: Diagnosis not present

## 2016-06-17 DIAGNOSIS — I10 Essential (primary) hypertension: Secondary | ICD-10-CM | POA: Diagnosis not present

## 2016-06-17 DIAGNOSIS — H1132 Conjunctival hemorrhage, left eye: Secondary | ICD-10-CM | POA: Diagnosis not present

## 2016-07-02 DIAGNOSIS — I10 Essential (primary) hypertension: Secondary | ICD-10-CM | POA: Diagnosis not present

## 2016-07-02 DIAGNOSIS — E669 Obesity, unspecified: Secondary | ICD-10-CM

## 2016-07-02 HISTORY — DX: Obesity, unspecified: E66.9

## 2016-08-20 DIAGNOSIS — E782 Mixed hyperlipidemia: Secondary | ICD-10-CM | POA: Diagnosis not present

## 2016-08-20 DIAGNOSIS — I10 Essential (primary) hypertension: Secondary | ICD-10-CM | POA: Diagnosis not present

## 2016-08-20 DIAGNOSIS — R7301 Impaired fasting glucose: Secondary | ICD-10-CM | POA: Diagnosis not present

## 2016-08-21 DIAGNOSIS — I471 Supraventricular tachycardia, unspecified: Secondary | ICD-10-CM

## 2016-08-21 DIAGNOSIS — I10 Essential (primary) hypertension: Secondary | ICD-10-CM | POA: Diagnosis not present

## 2016-08-21 HISTORY — DX: Supraventricular tachycardia, unspecified: I47.10

## 2016-08-21 HISTORY — DX: Supraventricular tachycardia: I47.1

## 2016-08-22 DIAGNOSIS — R399 Unspecified symptoms and signs involving the genitourinary system: Secondary | ICD-10-CM | POA: Diagnosis not present

## 2016-08-22 DIAGNOSIS — E782 Mixed hyperlipidemia: Secondary | ICD-10-CM | POA: Diagnosis not present

## 2016-08-22 DIAGNOSIS — Z23 Encounter for immunization: Secondary | ICD-10-CM | POA: Diagnosis not present

## 2016-08-22 DIAGNOSIS — I10 Essential (primary) hypertension: Secondary | ICD-10-CM | POA: Diagnosis not present

## 2016-08-22 DIAGNOSIS — E039 Hypothyroidism, unspecified: Secondary | ICD-10-CM | POA: Diagnosis not present

## 2016-08-22 DIAGNOSIS — J4522 Mild intermittent asthma with status asthmaticus: Secondary | ICD-10-CM | POA: Diagnosis not present

## 2016-08-22 DIAGNOSIS — J3089 Other allergic rhinitis: Secondary | ICD-10-CM | POA: Diagnosis not present

## 2016-08-22 DIAGNOSIS — R32 Unspecified urinary incontinence: Secondary | ICD-10-CM | POA: Diagnosis not present

## 2016-08-22 DIAGNOSIS — M1990 Unspecified osteoarthritis, unspecified site: Secondary | ICD-10-CM | POA: Diagnosis not present

## 2016-08-22 DIAGNOSIS — K219 Gastro-esophageal reflux disease without esophagitis: Secondary | ICD-10-CM | POA: Diagnosis not present

## 2016-10-03 DIAGNOSIS — K210 Gastro-esophageal reflux disease with esophagitis: Secondary | ICD-10-CM | POA: Diagnosis not present

## 2016-10-03 DIAGNOSIS — K589 Irritable bowel syndrome without diarrhea: Secondary | ICD-10-CM | POA: Diagnosis not present

## 2016-10-04 DIAGNOSIS — J069 Acute upper respiratory infection, unspecified: Secondary | ICD-10-CM | POA: Diagnosis not present

## 2016-11-25 DIAGNOSIS — J189 Pneumonia, unspecified organism: Secondary | ICD-10-CM | POA: Diagnosis not present

## 2016-12-09 DIAGNOSIS — Z1231 Encounter for screening mammogram for malignant neoplasm of breast: Secondary | ICD-10-CM | POA: Diagnosis not present

## 2017-02-25 DIAGNOSIS — I10 Essential (primary) hypertension: Secondary | ICD-10-CM | POA: Diagnosis not present

## 2017-02-25 DIAGNOSIS — E039 Hypothyroidism, unspecified: Secondary | ICD-10-CM | POA: Diagnosis not present

## 2017-02-27 DIAGNOSIS — K5900 Constipation, unspecified: Secondary | ICD-10-CM | POA: Diagnosis not present

## 2017-02-27 DIAGNOSIS — J4520 Mild intermittent asthma, uncomplicated: Secondary | ICD-10-CM | POA: Diagnosis not present

## 2017-02-27 DIAGNOSIS — M1990 Unspecified osteoarthritis, unspecified site: Secondary | ICD-10-CM | POA: Diagnosis not present

## 2017-02-27 DIAGNOSIS — R32 Unspecified urinary incontinence: Secondary | ICD-10-CM | POA: Diagnosis not present

## 2017-02-27 DIAGNOSIS — J3089 Other allergic rhinitis: Secondary | ICD-10-CM | POA: Diagnosis not present

## 2017-02-27 DIAGNOSIS — E782 Mixed hyperlipidemia: Secondary | ICD-10-CM | POA: Diagnosis not present

## 2017-02-27 DIAGNOSIS — E669 Obesity, unspecified: Secondary | ICD-10-CM | POA: Diagnosis not present

## 2017-02-27 DIAGNOSIS — I10 Essential (primary) hypertension: Secondary | ICD-10-CM | POA: Diagnosis not present

## 2017-02-27 DIAGNOSIS — K219 Gastro-esophageal reflux disease without esophagitis: Secondary | ICD-10-CM | POA: Diagnosis not present

## 2017-02-27 DIAGNOSIS — E039 Hypothyroidism, unspecified: Secondary | ICD-10-CM | POA: Diagnosis not present

## 2017-02-27 DIAGNOSIS — Z6841 Body Mass Index (BMI) 40.0 and over, adult: Secondary | ICD-10-CM | POA: Diagnosis not present

## 2017-02-27 DIAGNOSIS — Z0000 Encounter for general adult medical examination without abnormal findings: Secondary | ICD-10-CM | POA: Diagnosis not present

## 2017-03-09 DIAGNOSIS — L508 Other urticaria: Secondary | ICD-10-CM | POA: Diagnosis not present

## 2017-05-07 DIAGNOSIS — M7062 Trochanteric bursitis, left hip: Secondary | ICD-10-CM | POA: Diagnosis not present

## 2017-08-01 DIAGNOSIS — R300 Dysuria: Secondary | ICD-10-CM | POA: Diagnosis not present

## 2017-08-04 DIAGNOSIS — R300 Dysuria: Secondary | ICD-10-CM

## 2017-08-04 HISTORY — DX: Dysuria: R30.0

## 2017-08-19 DIAGNOSIS — R300 Dysuria: Secondary | ICD-10-CM | POA: Diagnosis not present

## 2017-09-08 ENCOUNTER — Encounter: Payer: Self-pay | Admitting: *Deleted

## 2017-09-09 DIAGNOSIS — Z23 Encounter for immunization: Secondary | ICD-10-CM | POA: Diagnosis not present

## 2017-09-16 ENCOUNTER — Other Ambulatory Visit: Payer: Self-pay | Admitting: *Deleted

## 2017-10-06 ENCOUNTER — Ambulatory Visit (HOSPITAL_BASED_OUTPATIENT_CLINIC_OR_DEPARTMENT_OTHER)
Admission: RE | Admit: 2017-10-06 | Discharge: 2017-10-06 | Disposition: A | Discharge status: Home / Self Care | Payer: PPO | Source: Ambulatory Visit | Attending: Cardiology | Admitting: Cardiology

## 2017-10-06 ENCOUNTER — Encounter: Payer: Self-pay | Admitting: Cardiology

## 2017-10-06 ENCOUNTER — Ambulatory Visit: Payer: PPO | Admitting: Cardiology

## 2017-10-06 VITALS — BP 124/70 | HR 67 | Ht 66.0 in | Wt 249.0 lb

## 2017-10-06 DIAGNOSIS — R0602 Shortness of breath: Secondary | ICD-10-CM | POA: Diagnosis not present

## 2017-10-06 DIAGNOSIS — I471 Supraventricular tachycardia: Secondary | ICD-10-CM

## 2017-10-06 DIAGNOSIS — M79661 Pain in right lower leg: Secondary | ICD-10-CM | POA: Diagnosis not present

## 2017-10-06 DIAGNOSIS — M7989 Other specified soft tissue disorders: Secondary | ICD-10-CM

## 2017-10-06 DIAGNOSIS — M5134 Other intervertebral disc degeneration, thoracic region: Secondary | ICD-10-CM | POA: Diagnosis not present

## 2017-10-06 DIAGNOSIS — I10 Essential (primary) hypertension: Secondary | ICD-10-CM

## 2017-10-06 NOTE — Patient Instructions (Signed)
Medication Instructions:  Your physician recommends that you continue on your current medications as directed. Please refer to the Current Medication list given to you today.  Labwork: Your physician recommends that you return for lab work in: today. D-dimer, lipid, BNP  Testing/Procedures: You had an EKG today.  A chest x-ray takes a picture of the organs and structures inside the chest, including the heart, lungs, and blood vessels. This test can show several things, including, whether the heart is enlarges; whether fluid is building up in the lungs; and whether pacemaker / defibrillator leads are still in place.  Follow-Up: Your physician wants you to follow-up in: 1 year. You will receive a reminder letter in the mail two months in advance. If you don't receive a letter, please call our office to schedule the follow-up appointment.  Any Other Special Instructions Will Be Listed Below (If Applicable).     If you need a refill on your cardiac medications before your next appointment, please call your pharmacy.

## 2017-10-06 NOTE — Progress Notes (Signed)
Cardiology Office Note:    Date:  10/06/2017   ID:  Peggy Jennings, DOB Oct 16, 1946, MRN [id number]  PCP:  Janie Morning, DO  Cardiologist:  Shirlee More, MD    Referring MD: Janie Morning, DO    ASSESSMENT:    1. Paroxysmal SVT (supraventricular tachycardia) ([place])   2. Essential hypertension   3. Pain and swelling of right lower leg   4. SOB (shortness of breath)    PLAN:    In order of problems listed above:  1. Stable after SVT ablation no recurrent tachyarrhythmia 2. Stable blood pressure at target continue current treatment with calcium channel blocker and ARB 3. She will have a d-dimer if abnormal needed duplex of the leg she is not in close proximity to her orthopedic surgery 4. Long-standing of asked her to have a BNP level performed if elevated will get a cardiac evaluation including echocardiogram and perform a chest x-ray for completeness.   Next appointment: One year   Medication Adjustments/Labs and Tests Ordered: Current medicines are reviewed at length with the patient today.  Concerns regarding medicines are outlined above.  Orders Placed This Encounter  Procedures  . DG Chest 2 View  . D-Dimer, Quantitative  . Lipid Profile  . B Nat Peptide  . EKG 12-Lead   No orders of the defined types were placed in this encounter.   Chief Complaint  Patient presents with  . Follow-up    SVT and hypertension    History of Present Illness:    Peggy Jennings is a 71 y.o. female with a hx of SVT with EP mapping and ablation and HTN  last seen in September 2017.Marland Kitchen Compliance with diet, lifestyle and medications: Yes She does have mild palpitation not severe or sustained.  She has to have her lipids checked she is statin intolerant.  She is limited predominantly by pain in the right lower extremity after orthopedic surgery and subsequent femur fracture in the morning she has pain and swelling in the right lower extremity no history of DVT.  No palpitation  syncope or TIA and no anginal discomfort. Past Medical History:  Diagnosis Date  . Allergic rhinitis 02/21/2016  . Arthritis 02/21/2016  . Asthma   . Asthma 02/21/2016  . Blood transfusion   . Cystocele 12/19/2015  . Depression   . Dysrhythmia    Hx of atrial fib. See progress note.   . Dysuria 08/04/2017  . Fibromyalgia   . GERD (gastroesophageal reflux disease)   . History of anemia   . History of atrial fibrillation    ablation performed in 2007.  Marland Kitchen History of bronchitis   . HTN (hypertension) 02/07/2012  . Hyperlipemia   . Hyperlipidemia 02/10/2012  . Hypertension   . Hypothyroid 02/21/2016  . Intermittent constipation 02/21/2016  . Knee pain 01/21/2013  . Lesion of bladder 12/19/2015  . Obesity 07/02/2016  . Paroxysmal SVT (supraventricular tachycardia) ([place]) 08/21/2016  . Periprosthetic fracture around internal prosthetic right knee joint 02/07/2012  . Recurrent upper respiratory infection (URI)   . Right knee pain 01/03/2014  . Shortness of breath   . Status post revision of total knee replacement 02/07/2012  . Urinary incontinence 12/19/2015  . Urinary tract infection 2 months ago     Past Surgical History:  Procedure Laterality Date  . ABDOMINAL HYSTERECTOMY  1977  . ACHILLES TENDON REPAIR    . APPENDECTOMY  1960  . CARDIAC ELECTROPHYSIOLOGY STUDY AND ABLATION    . ESOPHAGOGASTRODUODENOSCOPY    .  FINGER SURGERY     left index finger, right thumb, cyst removal  . FOOT SURGERY Left    Heel spur  . JOINT REPLACEMENT  2-3 years   Right Hip  . JOINT REPLACEMENT  3-4 years ago   Left Hip  . JOINT REPLACEMENT  4 years ago    Right Knee  . JOINT REPLACEMENT  10 years ago    Left Knee  . JOINT REPLACEMENT  01/20/2012   Right knee  . ORIF FEMUR FRACTURE  01/2012  . PLANTAR FASCIA SURGERY  7-8 Years    Right Foot    Current Medications: Current Meds  Medication Sig  . albuterol (PROAIR HFA) 108 (90 Base) MCG/ACT inhaler Inhale into the lungs.  . ASMANEX 30 METERED DOSES  220 MCG/INH inhaler   . cloNIDine (CATAPRES) 0.1 MG tablet Take 0.1 mg 2 (two) times daily by mouth.   . diltiazem (TIAZAC) 360 MG 24 hr capsule TAKE 1 CAPSULE BY MOUTH EVERY DAY  . DULoxetine (CYMBALTA) 60 MG capsule Take 60 mg by mouth at bedtime.  . fluticasone (FLONASE) 50 MCG/ACT nasal spray Place 1 spray into the nose daily.  Marland Kitchen levothyroxine (SYNTHROID, LEVOTHROID) 100 MCG tablet Take by mouth.  . meloxicam (MOBIC) 15 MG tablet Take 15 mg 2 (two) times daily by mouth.   . mometasone (ASMANEX) 220 MCG/INH inhaler Inhale into the lungs.  . Multiple Vitamin (MULTI-VITAMINS) TABS Take by mouth.  . pantoprazole (PROTONIX) 40 MG tablet Take 40 mg by mouth.  . telmisartan (MICARDIS) 80 MG tablet Take 80 mg 2 (two) times daily by mouth.      Allergies:   Ace inhibitors; Atorvastatin; Oxycodone-acetaminophen; Percocet [oxycodone-acetaminophen]; and Ramipril   Social History   Socioeconomic History  . Marital status: Married    Spouse name: None  . Number of children: None  . Years of education: None  . Highest education level: None  Social Needs  . Financial resource strain: None  . Food insecurity - worry: None  . Food insecurity - inability: None  . Transportation needs - medical: None  . Transportation needs - non-medical: None  Occupational History  . None  Tobacco Use  . Smoking status: Never Smoker  . Smokeless tobacco: Never Used  Substance and Sexual Activity  . Alcohol use: No  . Drug use: Yes    Types: Oxycodone  . Sexual activity: None  Other Topics Concern  . None  Social History Narrative  . None     Family History: The patient's family history includes Hypertension in her daughter, father, and mother; Stroke in her father and mother. There is no history of Anesthesia problems, Hypotension, Malignant hyperthermia, or Pseudochol deficiency. ROS:   Please see the history of present illness.    All other systems reviewed and are negative.  EKGs/Labs/Other  Studies Reviewed:    The following studies were reviewed today:  EKG:  EKG ordered today.  The ekg ordered today demonstrates sinus rhythm nonspecific T waves  Recent Labs: 02/25/17 CMP normal No results found for requested labs within last 8760 hours.  Recent Lipid Panel No results found for: CHOL, TRIG, HDL, CHOLHDL, VLDL, LDLCALC, LDLDIRECT  Physical Exam:    VS:  BP 124/70 (BP Location: Right Arm, Patient Position: Sitting, Cuff Size: Large)   Pulse 67   Ht 5\' 6"  (1.676 m)   Wt 249 lb (112.9 kg)   SpO2 97%   BMI 40.19 kg/m     Wt Readings from Last 3  Encounters:  10/06/17 249 lb (112.9 kg)  02/06/12 239 lb (108.4 kg)  01/07/12 239 lb 1.6 oz (108.5 kg)     GEN:  Well nourished, well developed in no acute distress HEENT: Normal NECK: No JVD; No carotid bruits LYMPHATICS: No lymphadenopathy CARDIAC: RRR, no murmurs, rubs, gallops RESPIRATORY:  Clear to auscultation without rales, wheezing or rhonchi  ABDOMEN: Soft, non-tender, non-distended MUSCULOSKELETAL:  No edema; No deformity  SKIN: Warm and dry NEUROLOGIC:  Alert and oriented x 3 PSYCHIATRIC:  Normal affect    Signed, Shirlee More, MD  10/06/2017 9:04 AM    [place]

## 2017-10-07 ENCOUNTER — Telehealth: Payer: Self-pay | Admitting: Cardiology

## 2017-10-07 DIAGNOSIS — R609 Edema, unspecified: Secondary | ICD-10-CM

## 2017-10-07 DIAGNOSIS — R7989 Other specified abnormal findings of blood chemistry: Secondary | ICD-10-CM

## 2017-10-07 DIAGNOSIS — E785 Hyperlipidemia, unspecified: Secondary | ICD-10-CM

## 2017-10-07 DIAGNOSIS — R0602 Shortness of breath: Secondary | ICD-10-CM

## 2017-10-07 LAB — LIPID PANEL
CHOL/HDL RATIO: 5.1 ratio — AB (ref 0.0–4.4)
Cholesterol, Total: 259 mg/dL — ABNORMAL HIGH (ref 100–199)
HDL: 51 mg/dL (ref >39)
LDL Calculated: 162 mg/dL — ABNORMAL HIGH (ref 0–99)
Triglycerides: 232 mg/dL — ABNORMAL HIGH (ref 0–149)
VLDL Cholesterol Cal: 46 mg/dL — ABNORMAL HIGH (ref 5–40)

## 2017-10-07 LAB — BRAIN NATRIURETIC PEPTIDE: BNP: 42.2 pg/mL (ref 0.0–100.0)

## 2017-10-07 LAB — D-DIMER, QUANTITATIVE: D-DIMER: 0.85 mg/L FEU — ABNORMAL HIGH (ref 0.00–0.49)

## 2017-10-07 MED ORDER — EZETIMIBE 10 MG PO TABS: 10.0000 mg | ORAL_TABLET | Freq: Every day | ORAL | 11 refills | Status: DC

## 2017-10-07 NOTE — Telephone Encounter (Signed)
Returned call

## 2017-10-07 NOTE — Telephone Encounter (Signed)
Patient advised of cholesterol results. Patient agreed to take zetia 10 mg. Prescription sent to [facility] per request.  Advised patient of elevated d-dimer results. Patient advised CTA chest and vascular ultrasound of both legs will be ordered to rule out blood clots. Patient advised vascular scheduled for 10:30 am tomorrow 10/08/17 at [facility]. Patient chose to come for the CTA before at 10 am. Imaging services made aware that patient is coming at 10 am for CTA and 10:30 for vascular study. Patient verbalized understanding of appointment date, time, and location. No further questions.

## 2017-10-07 NOTE — Telephone Encounter (Signed)
Destiny in imaging services called to advise that patient would need BMP prior to CTA. Called patient to inform her of this and patient will not be able to get blood work done prior to having CTA. Schedulers for vascular have already left for the day. Will call schedulers first thing in the morning to have vascular study rescheduled. Patient is going to [facility] in [place] tomorrow for [employer]. Patient verbalized understanding of going for lab work tomorrow. Destiny in imaging made aware to cancel CTA and will call her to reschedule CTA once vascular is scheduled. No further questions.

## 2017-10-07 NOTE — Addendum Note (Signed)
Addended by: Jossie Ng on: 10/07/2017 04:38 PM   Modules accepted: Orders

## 2017-10-07 NOTE — Addendum Note (Signed)
Addended by: Warner Mccreedy E on: 10/07/2017 03:29 PM   Modules accepted: Orders

## 2017-10-08 ENCOUNTER — Ambulatory Visit (HOSPITAL_BASED_OUTPATIENT_CLINIC_OR_DEPARTMENT_OTHER): Admission: RE | Admit: 2017-10-08 | Payer: PPO | Source: Ambulatory Visit

## 2017-10-08 ENCOUNTER — Ambulatory Visit (HOSPITAL_BASED_OUTPATIENT_CLINIC_OR_DEPARTMENT_OTHER): Payer: PPO

## 2017-10-08 ENCOUNTER — Encounter (HOSPITAL_BASED_OUTPATIENT_CLINIC_OR_DEPARTMENT_OTHER): Payer: PPO

## 2017-10-08 DIAGNOSIS — R0602 Shortness of breath: Secondary | ICD-10-CM | POA: Diagnosis not present

## 2017-10-08 DIAGNOSIS — R609 Edema, unspecified: Secondary | ICD-10-CM | POA: Diagnosis not present

## 2017-10-08 DIAGNOSIS — J0100 Acute maxillary sinusitis, unspecified: Secondary | ICD-10-CM | POA: Diagnosis not present

## 2017-10-08 DIAGNOSIS — R7989 Other specified abnormal findings of blood chemistry: Secondary | ICD-10-CM | POA: Diagnosis not present

## 2017-10-08 DIAGNOSIS — E785 Hyperlipidemia, unspecified: Secondary | ICD-10-CM | POA: Diagnosis not present

## 2017-10-08 NOTE — Telephone Encounter (Signed)
Fritz Pickerel, husband, took message for patient to arrive for CTA at 12:30 pm on 10/10/17 and that vascular study will be performed after. Fritz Pickerel will inform patient and advised to call with any further questions or concerns.

## 2017-10-09 LAB — BASIC METABOLIC PANEL
BUN / CREAT RATIO: 18 (ref 12–28)
BUN: 16 mg/dL (ref 8–27)
CO2: 24 mmol/L (ref 20–29)
CREATININE: 0.87 mg/dL (ref 0.57–1.00)
Calcium: 9.8 mg/dL (ref 8.7–10.3)
Chloride: 102 mmol/L (ref 96–106)
GFR, EST AFRICAN AMERICAN: 78 mL/min/{1.73_m2} (ref >59)
GFR, EST NON AFRICAN AMERICAN: 67 mL/min/{1.73_m2} (ref >59)
GLUCOSE: 107 mg/dL — AB (ref 65–99)
Potassium: 4 mmol/L (ref 3.5–5.2)
SODIUM: 140 mmol/L (ref 134–144)

## 2017-10-10 ENCOUNTER — Ambulatory Visit (HOSPITAL_BASED_OUTPATIENT_CLINIC_OR_DEPARTMENT_OTHER)
Admission: RE | Admit: 2017-10-10 | Discharge: 2017-10-10 | Disposition: A | Discharge status: Home / Self Care | Payer: PPO | Source: Ambulatory Visit | Attending: Cardiology | Admitting: Cardiology

## 2017-10-10 DIAGNOSIS — R609 Edema, unspecified: Secondary | ICD-10-CM

## 2017-10-10 DIAGNOSIS — R7989 Other specified abnormal findings of blood chemistry: Secondary | ICD-10-CM

## 2017-10-10 DIAGNOSIS — R0602 Shortness of breath: Secondary | ICD-10-CM | POA: Insufficient documentation

## 2017-10-10 MED ORDER — IOPAMIDOL (ISOVUE-370) INJECTION 76%
100.0000 mL | Freq: Once | INTRAVENOUS | Status: AC | PRN
  Administered 2017-10-10: 100 mL via INTRAVENOUS

## 2017-10-10 NOTE — Progress Notes (Signed)
Lower Extremity Venous Bilateral

## 2017-11-06 ENCOUNTER — Telehealth: Payer: Self-pay

## 2017-11-06 NOTE — Telephone Encounter (Signed)
Contacted patient to remind her to have a lipid recheck after starting zetia. Patient states that she took Zetia for 3 days and began to fill so dizzy she could hardly walk. Dr. Bettina Gavia made aware. Advised patient not to have lipid recheck. Patient verbalized understanding. No further questions.

## 2017-12-18 DIAGNOSIS — Z131 Encounter for screening for diabetes mellitus: Secondary | ICD-10-CM | POA: Diagnosis not present

## 2017-12-18 DIAGNOSIS — I10 Essential (primary) hypertension: Secondary | ICD-10-CM | POA: Diagnosis not present

## 2017-12-18 DIAGNOSIS — Z7689 Persons encountering health services in other specified circumstances: Secondary | ICD-10-CM | POA: Diagnosis not present

## 2017-12-18 DIAGNOSIS — J4540 Moderate persistent asthma, uncomplicated: Secondary | ICD-10-CM | POA: Diagnosis not present

## 2017-12-18 DIAGNOSIS — Z1339 Encounter for screening examination for other mental health and behavioral disorders: Secondary | ICD-10-CM | POA: Diagnosis not present

## 2017-12-18 DIAGNOSIS — Z79899 Other long term (current) drug therapy: Secondary | ICD-10-CM | POA: Diagnosis not present

## 2017-12-18 DIAGNOSIS — Z1331 Encounter for screening for depression: Secondary | ICD-10-CM | POA: Diagnosis not present

## 2017-12-18 DIAGNOSIS — K219 Gastro-esophageal reflux disease without esophagitis: Secondary | ICD-10-CM | POA: Diagnosis not present

## 2017-12-18 DIAGNOSIS — E039 Hypothyroidism, unspecified: Secondary | ICD-10-CM | POA: Diagnosis not present

## 2017-12-18 DIAGNOSIS — Z9181 History of falling: Secondary | ICD-10-CM | POA: Diagnosis not present

## 2017-12-18 DIAGNOSIS — M797 Fibromyalgia: Secondary | ICD-10-CM | POA: Diagnosis not present

## 2017-12-18 DIAGNOSIS — M1990 Unspecified osteoarthritis, unspecified site: Secondary | ICD-10-CM | POA: Diagnosis not present

## 2017-12-22 DIAGNOSIS — J4521 Mild intermittent asthma with (acute) exacerbation: Secondary | ICD-10-CM | POA: Diagnosis not present

## 2017-12-22 DIAGNOSIS — R062 Wheezing: Secondary | ICD-10-CM | POA: Diagnosis not present

## 2017-12-22 DIAGNOSIS — J329 Chronic sinusitis, unspecified: Secondary | ICD-10-CM | POA: Diagnosis not present

## 2018-01-02 DIAGNOSIS — J320 Chronic maxillary sinusitis: Secondary | ICD-10-CM | POA: Diagnosis not present

## 2018-01-02 DIAGNOSIS — Z6841 Body Mass Index (BMI) 40.0 and over, adult: Secondary | ICD-10-CM | POA: Diagnosis not present

## 2018-01-02 DIAGNOSIS — H6592 Unspecified nonsuppurative otitis media, left ear: Secondary | ICD-10-CM | POA: Diagnosis not present

## 2018-01-15 DIAGNOSIS — N3001 Acute cystitis with hematuria: Secondary | ICD-10-CM | POA: Diagnosis not present

## 2018-01-15 DIAGNOSIS — I10 Essential (primary) hypertension: Secondary | ICD-10-CM | POA: Diagnosis not present

## 2018-01-15 DIAGNOSIS — Z6841 Body Mass Index (BMI) 40.0 and over, adult: Secondary | ICD-10-CM | POA: Diagnosis not present

## 2018-02-05 DIAGNOSIS — N3001 Acute cystitis with hematuria: Secondary | ICD-10-CM | POA: Diagnosis not present

## 2018-02-05 DIAGNOSIS — Z6841 Body Mass Index (BMI) 40.0 and over, adult: Secondary | ICD-10-CM | POA: Diagnosis not present

## 2018-02-05 DIAGNOSIS — R3981 Functional urinary incontinence: Secondary | ICD-10-CM | POA: Diagnosis not present

## 2018-02-05 DIAGNOSIS — I10 Essential (primary) hypertension: Secondary | ICD-10-CM | POA: Diagnosis not present

## 2018-02-16 DIAGNOSIS — L738 Other specified follicular disorders: Secondary | ICD-10-CM | POA: Diagnosis not present

## 2018-02-16 DIAGNOSIS — L821 Other seborrheic keratosis: Secondary | ICD-10-CM | POA: Diagnosis not present

## 2018-02-16 DIAGNOSIS — L720 Epidermal cyst: Secondary | ICD-10-CM | POA: Diagnosis not present

## 2018-02-16 DIAGNOSIS — L820 Inflamed seborrheic keratosis: Secondary | ICD-10-CM | POA: Diagnosis not present

## 2018-02-16 DIAGNOSIS — L918 Other hypertrophic disorders of the skin: Secondary | ICD-10-CM | POA: Diagnosis not present

## 2018-03-11 DIAGNOSIS — Z0000 Encounter for general adult medical examination without abnormal findings: Secondary | ICD-10-CM | POA: Diagnosis not present

## 2018-03-11 DIAGNOSIS — M859 Disorder of bone density and structure, unspecified: Secondary | ICD-10-CM | POA: Diagnosis not present

## 2018-03-11 DIAGNOSIS — Z79899 Other long term (current) drug therapy: Secondary | ICD-10-CM | POA: Diagnosis not present

## 2018-03-11 DIAGNOSIS — Z13820 Encounter for screening for osteoporosis: Secondary | ICD-10-CM | POA: Diagnosis not present

## 2018-03-11 DIAGNOSIS — Z1272 Encounter for screening for malignant neoplasm of vagina: Secondary | ICD-10-CM | POA: Diagnosis not present

## 2018-03-11 DIAGNOSIS — Z01419 Encounter for gynecological examination (general) (routine) without abnormal findings: Secondary | ICD-10-CM | POA: Diagnosis not present

## 2018-03-18 DIAGNOSIS — Z1211 Encounter for screening for malignant neoplasm of colon: Secondary | ICD-10-CM | POA: Diagnosis not present

## 2018-05-04 DIAGNOSIS — F4321 Adjustment disorder with depressed mood: Secondary | ICD-10-CM | POA: Diagnosis not present

## 2018-05-04 DIAGNOSIS — E039 Hypothyroidism, unspecified: Secondary | ICD-10-CM | POA: Diagnosis not present

## 2018-05-04 DIAGNOSIS — Z1339 Encounter for screening examination for other mental health and behavioral disorders: Secondary | ICD-10-CM | POA: Diagnosis not present

## 2018-05-04 DIAGNOSIS — Z6841 Body Mass Index (BMI) 40.0 and over, adult: Secondary | ICD-10-CM | POA: Diagnosis not present

## 2018-05-19 DIAGNOSIS — Z6841 Body Mass Index (BMI) 40.0 and over, adult: Secondary | ICD-10-CM | POA: Diagnosis not present

## 2018-05-19 DIAGNOSIS — Z1339 Encounter for screening examination for other mental health and behavioral disorders: Secondary | ICD-10-CM | POA: Diagnosis not present

## 2018-05-19 DIAGNOSIS — F4321 Adjustment disorder with depressed mood: Secondary | ICD-10-CM | POA: Diagnosis not present

## 2018-06-09 DIAGNOSIS — F4321 Adjustment disorder with depressed mood: Secondary | ICD-10-CM | POA: Diagnosis not present

## 2018-06-09 DIAGNOSIS — R635 Abnormal weight gain: Secondary | ICD-10-CM | POA: Diagnosis not present

## 2018-06-09 DIAGNOSIS — R609 Edema, unspecified: Secondary | ICD-10-CM | POA: Diagnosis not present

## 2018-06-09 DIAGNOSIS — I10 Essential (primary) hypertension: Secondary | ICD-10-CM | POA: Diagnosis not present

## 2018-06-09 DIAGNOSIS — Z6841 Body Mass Index (BMI) 40.0 and over, adult: Secondary | ICD-10-CM | POA: Diagnosis not present

## 2018-06-09 DIAGNOSIS — R600 Localized edema: Secondary | ICD-10-CM | POA: Diagnosis not present

## 2018-06-11 DIAGNOSIS — M7062 Trochanteric bursitis, left hip: Secondary | ICD-10-CM

## 2018-06-11 HISTORY — DX: Trochanteric bursitis, left hip: M70.62

## 2018-07-06 ENCOUNTER — Ambulatory Visit (INDEPENDENT_AMBULATORY_CARE_PROVIDER_SITE_OTHER): Payer: PPO | Admitting: Cardiology

## 2018-07-06 ENCOUNTER — Encounter: Payer: Self-pay | Admitting: Cardiology

## 2018-07-06 ENCOUNTER — Encounter: Payer: Self-pay | Admitting: Emergency Medicine

## 2018-07-06 VITALS — BP 152/84 | HR 64 | Ht 66.0 in | Wt 254.6 lb

## 2018-07-06 DIAGNOSIS — I10 Essential (primary) hypertension: Secondary | ICD-10-CM

## 2018-07-06 DIAGNOSIS — I471 Supraventricular tachycardia: Secondary | ICD-10-CM

## 2018-07-06 DIAGNOSIS — Z6841 Body Mass Index (BMI) 40.0 and over, adult: Secondary | ICD-10-CM | POA: Diagnosis not present

## 2018-07-06 DIAGNOSIS — R600 Localized edema: Secondary | ICD-10-CM

## 2018-07-06 DIAGNOSIS — R0602 Shortness of breath: Secondary | ICD-10-CM

## 2018-07-06 HISTORY — DX: Shortness of breath: R06.02

## 2018-07-06 HISTORY — DX: Localized edema: R60.0

## 2018-07-06 NOTE — Patient Instructions (Signed)

## 2018-07-06 NOTE — Progress Notes (Signed)
Cardiology Office Note:    Date:  07/06/2018   ID:  PRISILLA KOCSIS, DOB 1946/02/16, MRN [id number]  PCP:  Serita Grammes, MD  Cardiologist:  Shirlee More, MD    Referring MD: Janie Morning, DO    ASSESSMENT:    1. Paroxysmal SVT (supraventricular tachycardia) ([place])   2. Essential hypertension   3. Edema of both lower extremities   4. SOB (shortness of breath) on exertion    PLAN:    In order of problems listed above:  1. Stable no recurrence after EP study 2. Stable blood pressure at target I do not think her calcium channel blocker clonidine is the reason for shortness of breath or edema 3. Stable if anything improved clinically she may have pseudo-heart failure due to obstructive sleep apnea may benefit from pulmonary evaluation 4. Stable appears to be not due to cardiac etiology recheck echocardiogram and may benefit from pulmonary evaluation with BMI greater than 40 hypoventilation restrictive lung disease and/or sleep apnea   Next appointment: One year   Medication Adjustments/Labs and Tests Ordered: Current medicines are reviewed at length with the patient today.  Concerns regarding medicines are outlined above.  No orders of the defined types were placed in this encounter.  No orders of the defined types were placed in this encounter.   Chief Complaint  Patient presents with  . Follow-up    per Dr Jeryl Columbia to evalulate bilateral edema    History of Present Illness:    ATHEA HALEY is a 72 y.o. female with a hx of SVT with EP mapping and ablation hyperlipidemia and HTN    last seen 04/16/18.  At that time she had an extensive evaluation for lower extremity edema and SOB  including d-dimer that was elevated normal venous duplex and cardiac CTA without pulmonary embolism and BNP level was normal along with a normal chest x-ray Compliance with diet, lifestyle and medications: Yes  She has a chronic problem with edema and shortness of breath and had  previous extensive evaluation.  If anything the symptoms have improved recently.  She likely has obstructive sleep apnea snores severely and her husband has moved out of the bedroom.  Clinically she may have pseudo-heart failure though it has not done as an echocardiogram and if normal would benefit from a pulmonary evaluation for sleep apnea as well as hypoventilation syndrome with BMI greater than 40.  She has had no chest pain palpitations syncope no cough or wheezing. Past Medical History:  Diagnosis Date  . Allergic rhinitis 02/21/2016  . Arthritis 02/21/2016  . Asthma   . Asthma 02/21/2016  . Blood transfusion   . Cystocele 12/19/2015  . Depression   . Dysrhythmia    Hx of atrial fib. See progress note.   . Dysuria 08/04/2017  . Fibromyalgia   . GERD (gastroesophageal reflux disease)   . History of anemia   . History of atrial fibrillation    ablation performed in 2007.  Marland Kitchen History of bronchitis   . HTN (hypertension) 02/07/2012  . Hyperlipemia   . Hyperlipidemia 02/10/2012  . Hypertension   . Hypothyroid 02/21/2016  . Intermittent constipation 02/21/2016  . Knee pain 01/21/2013  . Lesion of bladder 12/19/2015  . Obesity 07/02/2016  . Paroxysmal SVT (supraventricular tachycardia) ([place]) 08/21/2016  . Periprosthetic fracture around internal prosthetic right knee joint 02/07/2012  . Recurrent upper respiratory infection (URI)   . Right knee pain 01/03/2014  . Shortness of breath   . Status post revision  of total knee replacement 02/07/2012  . Urinary incontinence 12/19/2015  . Urinary tract infection 2 months ago     Past Surgical History:  Procedure Laterality Date  . ABDOMINAL HYSTERECTOMY  1977  . ACHILLES TENDON REPAIR    . APPENDECTOMY  1960  . CARDIAC ELECTROPHYSIOLOGY STUDY AND ABLATION    . ESOPHAGOGASTRODUODENOSCOPY    . FINGER SURGERY     left index finger, right thumb, cyst removal  . FOOT SURGERY Left    Heel spur  . JOINT REPLACEMENT  2-3 years   Right Hip  . JOINT  REPLACEMENT  3-4 years ago   Left Hip  . JOINT REPLACEMENT  4 years ago    Right Knee  . JOINT REPLACEMENT  10 years ago    Left Knee  . JOINT REPLACEMENT  01/20/2012   Right knee  . ORIF FEMUR FRACTURE  01/2012  . ORIF FEMUR FRACTURE  02/06/2012   Procedure: OPEN REDUCTION INTERNAL FIXATION (ORIF) DISTAL FEMUR FRACTURE;  Surgeon: Rozanna Box, MD;  Location: [place];  Service: Orthopedics;  Laterality: Right;  . PLANTAR FASCIA SURGERY  7-8 Years    Right Foot  . TOTAL KNEE REVISION  01/20/2012   Procedure: TOTAL KNEE REVISION;  Surgeon: Rudean Haskell, MD;  Location: [facility];  Service: Orthopedics;  Laterality: Right;    Current Medications: Current Meds  Medication Sig  . albuterol (PROAIR HFA) 108 (90 Base) MCG/ACT inhaler Inhale into the lungs.  . cloNIDine (CATAPRES) 0.1 MG tablet Take 0.1 mg 2 (two) times daily by mouth.   . diltiazem (TIAZAC) 360 MG 24 hr capsule TAKE 1 CAPSULE BY MOUTH EVERY DAY  . fluticasone (FLONASE) 50 MCG/ACT nasal spray Place 1 spray into the nose daily.  Marland Kitchen levothyroxine (SYNTHROID, LEVOTHROID) 100 MCG tablet Take 100 mcg by mouth daily before breakfast.   . meloxicam (MOBIC) 15 MG tablet Take 15 mg by mouth daily.   . mometasone (ASMANEX) 220 MCG/INH inhaler Inhale into the lungs.  . Multiple Vitamin (MULTI-VITAMINS) TABS Take 1 tablet by mouth daily.   . pantoprazole (PROTONIX) 40 MG tablet Take 40 mg by mouth daily.   Marland Kitchen telmisartan (MICARDIS) 80 MG tablet Take 80 mg 2 (two) times daily by mouth.      Allergies:   Ace inhibitors; Atorvastatin; Oxycodone-acetaminophen; Percocet [oxycodone-acetaminophen]; and Ramipril   Social History   Socioeconomic History  . Marital status: Married    Spouse name: Not on file  . Number of children: Not on file  . Years of education: Not on file  . Highest education level: Not on file  Occupational History  . Not on file  Social Needs  . Financial resource strain: Not on file  . Food insecurity:    Worry:  Not on file    Inability: Not on file  . Transportation needs:    Medical: Not on file    Non-medical: Not on file  Tobacco Use  . Smoking status: Never Smoker  . Smokeless tobacco: Never Used  Substance and Sexual Activity  . Alcohol use: No  . Drug use: Yes    Types: Oxycodone  . Sexual activity: Not on file  Lifestyle  . Physical activity:    Days per week: Not on file    Minutes per session: Not on file  . Stress: Not on file  Relationships  . Social connections:    Talks on phone: Not on file    Gets together: Not on file  Attends religious service: Not on file    Active member of club or organization: Not on file    Attends meetings of clubs or organizations: Not on file    Relationship status: Not on file  Other Topics Concern  . Not on file  Social History Narrative  . Not on file     Family History: The patient's family history includes Hypertension in her daughter, father, and mother; Stroke in her father and mother. There is no history of Anesthesia problems, Hypotension, Malignant hyperthermia, or Pseudochol deficiency. ROS:   Please see the history of present illness.    All other systems reviewed and are negative.  EKGs/Labs/Other Studies Reviewed:    The following studies were reviewed today:   Recent Labs: 10/06/2017: BNP 42.2 10/08/2017: BUN 16; Creatinine, Ser 0.87; Potassium 4.0; Sodium 140   10/06/17   BNP 42 D Dimer 0.85 Recent Lipid Panel    Component Value Date/Time   CHOL 259 (H) 10/06/2017 1002   TRIG 232 (H) 10/06/2017 1002   HDL 51 10/06/2017 1002   CHOLHDL 5.1 (H) 10/06/2017 1002   LDLCALC 162 (H) 10/06/2017 1002    Physical Exam:    VS:  BP (!) 152/84 (BP Location: Right Arm, Patient Position: Sitting, Cuff Size: Large)   Pulse 64   Ht 5\' 6"  (1.676 m)   Wt 254 lb 9.6 oz (115.5 kg)   SpO2 97%   BMI 41.09 kg/m     Wt Readings from Last 3 Encounters:  07/06/18 254 lb 9.6 oz (115.5 kg)  10/06/17 249 lb (112.9 kg)    02/06/12 239 lb (108.4 kg)     GEN:  Well nourished, well developed in no acute distress HEENT: Normal NECK: No JVD; No carotid bruits LYMPHATICS: No lymphadenopathy CARDIAC: RRR, no murmurs, rubs, gallops RESPIRATORY:  Clear to auscultation without rales, wheezing or rhonchi  ABDOMEN: Soft, non-tender, non-distended MUSCULOSKELETAL:  Bilateral 2+ to the knee edema; No deformity  SKIN: Warm and dry NEUROLOGIC:  Alert and oriented x 3 PSYCHIATRIC:  Normal affect    Signed, Shirlee More, MD  07/06/2018 2:19 PM    [facility]

## 2018-07-10 DIAGNOSIS — R0602 Shortness of breath: Secondary | ICD-10-CM | POA: Diagnosis not present

## 2018-07-10 DIAGNOSIS — R0682 Tachypnea, not elsewhere classified: Secondary | ICD-10-CM | POA: Diagnosis not present

## 2018-07-10 DIAGNOSIS — R001 Bradycardia, unspecified: Secondary | ICD-10-CM | POA: Diagnosis not present

## 2018-07-22 DIAGNOSIS — N3001 Acute cystitis with hematuria: Secondary | ICD-10-CM | POA: Diagnosis not present

## 2018-07-22 DIAGNOSIS — Z6841 Body Mass Index (BMI) 40.0 and over, adult: Secondary | ICD-10-CM | POA: Diagnosis not present

## 2018-07-29 DIAGNOSIS — K210 Gastro-esophageal reflux disease with esophagitis: Secondary | ICD-10-CM | POA: Diagnosis not present

## 2018-07-29 DIAGNOSIS — I10 Essential (primary) hypertension: Secondary | ICD-10-CM | POA: Diagnosis not present

## 2018-08-03 DIAGNOSIS — I10 Essential (primary) hypertension: Secondary | ICD-10-CM | POA: Diagnosis not present

## 2018-08-05 ENCOUNTER — Other Ambulatory Visit: Payer: Self-pay

## 2018-08-05 NOTE — Patient Outreach (Signed)
[facility]) Care Management  08/05/2018  Peggy Jennings 1946/04/18 [id number]  Nurse Call Line Referral Date: 08/05/18 Reason for Referral: shortness of breath, weakness, sweating Nurse call line recommendation: see primary care provider within 3 days  Telephone call to patient regarding nurse call line referral. HIPAA verified with patient. Explained reason for call. Patient states, " I try not to deal with [place] no more than I have to."   Patient states she is feeling a little better today. She reports she did not take the medication diltiazem last night. Patient states she slept better last night and does not feel as weak today.  Patient states she is scheduled to see her doctor on tomorrow 08/06/18.  Patient denies any further needs/ concerns. RNCM advised patient to follow up at emergency room  if symptoms worsen or if become severe call 911.  Patient verbalized understanding  PLAN:  RNCM will close patient due to patient  Being assessed and having no further needs.  RNCm will send closure letter to patients primary MD.   Quinn Plowman RN,BSN,CCM [facility] Telephonic  [phone]

## 2018-08-06 DIAGNOSIS — N3001 Acute cystitis with hematuria: Secondary | ICD-10-CM | POA: Diagnosis not present

## 2018-08-06 DIAGNOSIS — J984 Other disorders of lung: Secondary | ICD-10-CM | POA: Diagnosis not present

## 2018-08-06 DIAGNOSIS — Z0000 Encounter for general adult medical examination without abnormal findings: Secondary | ICD-10-CM | POA: Diagnosis not present

## 2018-08-06 DIAGNOSIS — R0609 Other forms of dyspnea: Secondary | ICD-10-CM | POA: Diagnosis not present

## 2018-08-06 DIAGNOSIS — I10 Essential (primary) hypertension: Secondary | ICD-10-CM | POA: Diagnosis not present

## 2018-08-06 DIAGNOSIS — R0602 Shortness of breath: Secondary | ICD-10-CM | POA: Diagnosis not present

## 2018-08-19 DIAGNOSIS — R0609 Other forms of dyspnea: Secondary | ICD-10-CM | POA: Diagnosis not present

## 2018-08-19 DIAGNOSIS — Z1231 Encounter for screening mammogram for malignant neoplasm of breast: Secondary | ICD-10-CM | POA: Diagnosis not present

## 2018-08-19 DIAGNOSIS — R0600 Dyspnea, unspecified: Secondary | ICD-10-CM | POA: Diagnosis not present

## 2018-08-24 DIAGNOSIS — J302 Other seasonal allergic rhinitis: Secondary | ICD-10-CM | POA: Diagnosis not present

## 2018-08-24 DIAGNOSIS — I10 Essential (primary) hypertension: Secondary | ICD-10-CM | POA: Diagnosis not present

## 2018-08-24 DIAGNOSIS — R0609 Other forms of dyspnea: Secondary | ICD-10-CM | POA: Diagnosis not present

## 2018-08-24 DIAGNOSIS — J984 Other disorders of lung: Secondary | ICD-10-CM | POA: Diagnosis not present

## 2018-08-24 DIAGNOSIS — M797 Fibromyalgia: Secondary | ICD-10-CM | POA: Diagnosis not present

## 2018-08-24 DIAGNOSIS — Z6839 Body mass index (BMI) 39.0-39.9, adult: Secondary | ICD-10-CM | POA: Diagnosis not present

## 2018-08-24 DIAGNOSIS — K219 Gastro-esophageal reflux disease without esophagitis: Secondary | ICD-10-CM | POA: Diagnosis not present

## 2018-08-24 DIAGNOSIS — M1990 Unspecified osteoarthritis, unspecified site: Secondary | ICD-10-CM | POA: Diagnosis not present

## 2018-08-24 DIAGNOSIS — E039 Hypothyroidism, unspecified: Secondary | ICD-10-CM | POA: Diagnosis not present

## 2018-09-01 DIAGNOSIS — N3020 Other chronic cystitis without hematuria: Secondary | ICD-10-CM | POA: Diagnosis not present

## 2018-09-01 DIAGNOSIS — N3000 Acute cystitis without hematuria: Secondary | ICD-10-CM | POA: Diagnosis not present

## 2018-09-07 DIAGNOSIS — Z6841 Body Mass Index (BMI) 40.0 and over, adult: Secondary | ICD-10-CM | POA: Diagnosis not present

## 2018-09-07 DIAGNOSIS — I10 Essential (primary) hypertension: Secondary | ICD-10-CM | POA: Diagnosis not present

## 2018-09-07 DIAGNOSIS — J302 Other seasonal allergic rhinitis: Secondary | ICD-10-CM | POA: Diagnosis not present

## 2018-09-07 DIAGNOSIS — N3001 Acute cystitis with hematuria: Secondary | ICD-10-CM | POA: Diagnosis not present

## 2018-09-07 DIAGNOSIS — J984 Other disorders of lung: Secondary | ICD-10-CM | POA: Diagnosis not present

## 2018-09-24 DIAGNOSIS — I10 Essential (primary) hypertension: Secondary | ICD-10-CM | POA: Diagnosis not present

## 2018-09-24 DIAGNOSIS — E039 Hypothyroidism, unspecified: Secondary | ICD-10-CM | POA: Diagnosis not present

## 2018-09-28 ENCOUNTER — Ambulatory Visit: Payer: PPO | Admitting: Cardiology

## 2018-09-28 DIAGNOSIS — J302 Other seasonal allergic rhinitis: Secondary | ICD-10-CM | POA: Diagnosis not present

## 2018-09-28 DIAGNOSIS — Z6841 Body Mass Index (BMI) 40.0 and over, adult: Secondary | ICD-10-CM | POA: Diagnosis not present

## 2018-09-28 DIAGNOSIS — J984 Other disorders of lung: Secondary | ICD-10-CM | POA: Diagnosis not present

## 2018-09-28 DIAGNOSIS — I10 Essential (primary) hypertension: Secondary | ICD-10-CM | POA: Diagnosis not present

## 2018-10-12 DIAGNOSIS — L820 Inflamed seborrheic keratosis: Secondary | ICD-10-CM | POA: Diagnosis not present

## 2018-10-12 DIAGNOSIS — L821 Other seborrheic keratosis: Secondary | ICD-10-CM | POA: Diagnosis not present

## 2018-10-14 IMAGING — CT CT ANGIO CHEST
2 of 8 series · 19 of 36 positions shown · IV contrast (iopamidol)
Comparison: 12/19/2014

CLINICAL DATA: Shortness of breath.  Pulmonary embolism suspected.

EXAM:
CT ANGIOGRAPHY CHEST WITH CONTRAST
TECHNIQUE: Multidetector CT imaging of the chest was performed using the
standard protocol during bolus administration of intravenous
contrast. Multiplanar CT image reconstructions and MIPs were
obtained to evaluate the vascular anatomy.
CONTRAST:  100mL [id number] IOPAMIDOL ([id number]) INJECTION 76%

[Series 6: pe coronal mpr · coronal · 0.60mm/px · 1 of 151 slices shown]
[im 76/151  mediastinal]
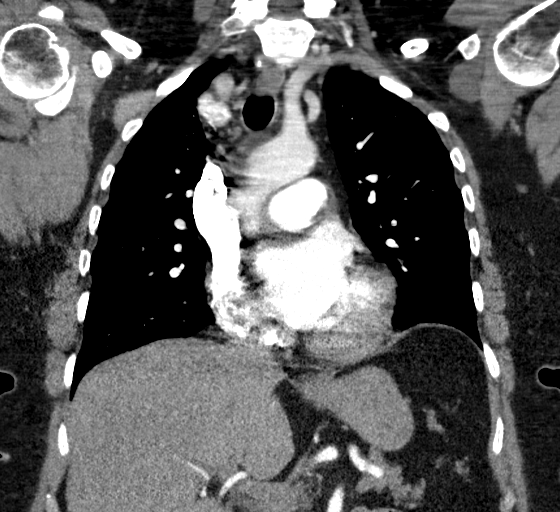

[Series 10: pe thins · axial · 0.77mm/px · z∈[+1154,+1435]mm · 18 of 315 slices shown]
[im 17/315  lung]
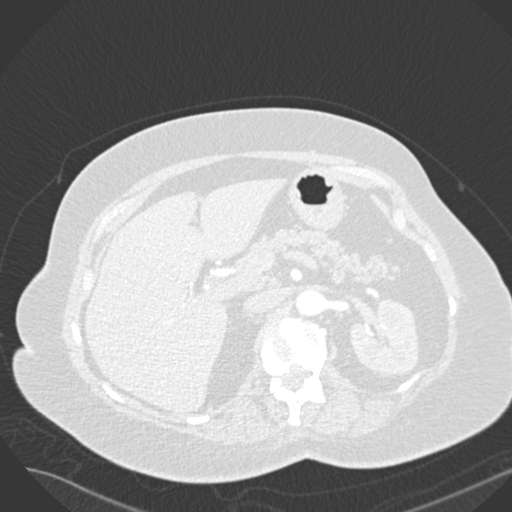
[im 34/315  mediastinal]
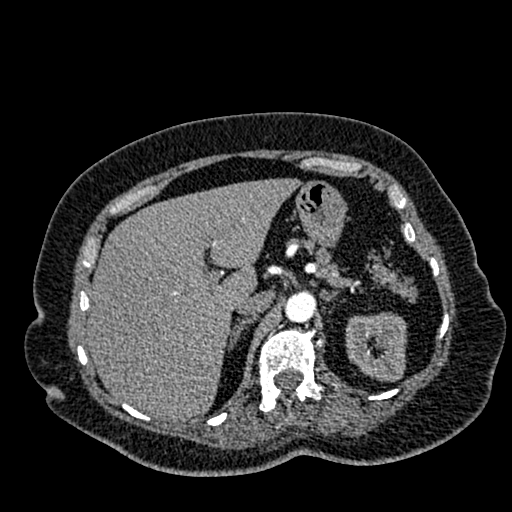
[im 50/315  lung]
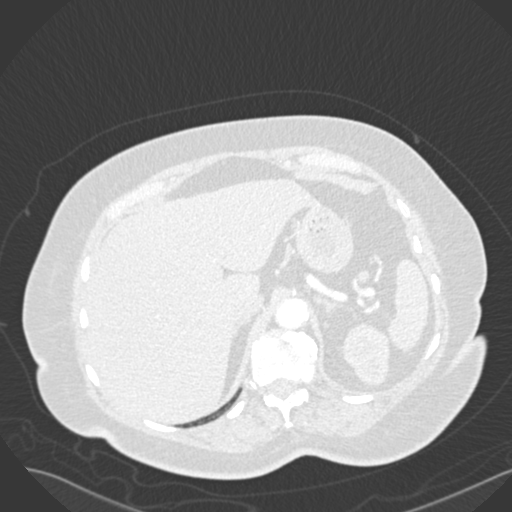
[im 67/315  mediastinal]
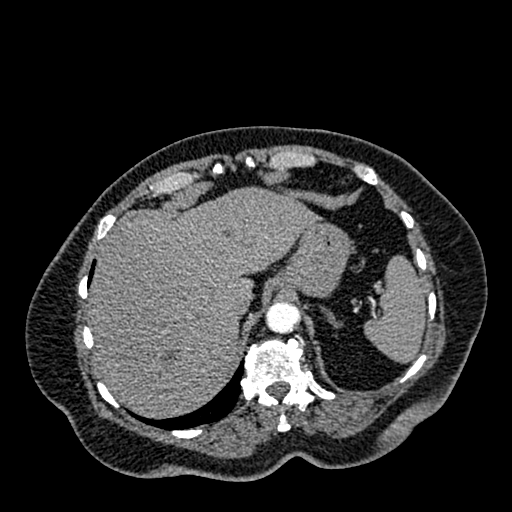
[im 83/315  lung]
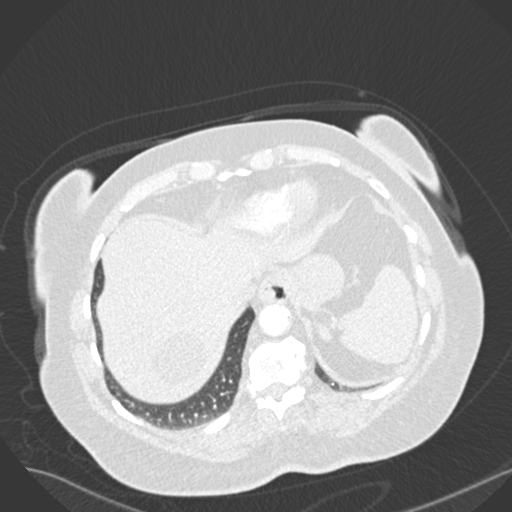
[im 100/315  mediastinal]
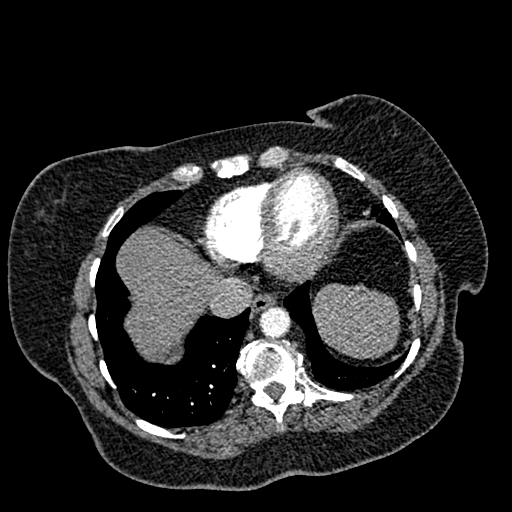
[im 116/315  lung]
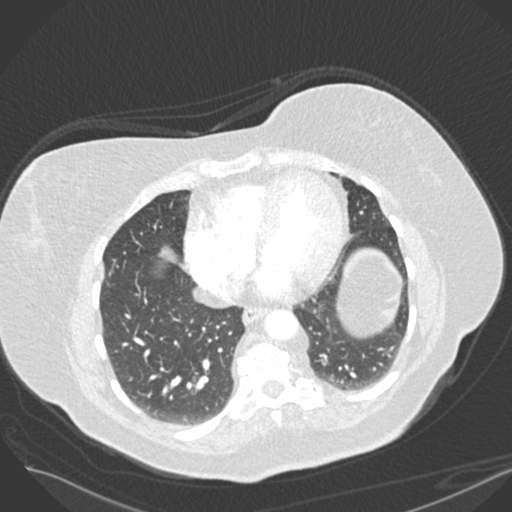
[im 133/315  mediastinal]
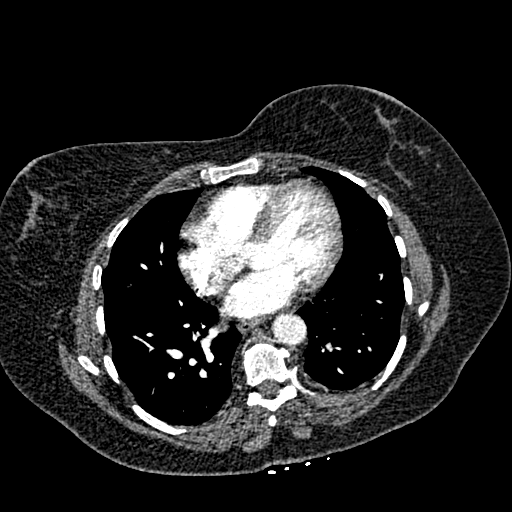
[im 149/315  lung]
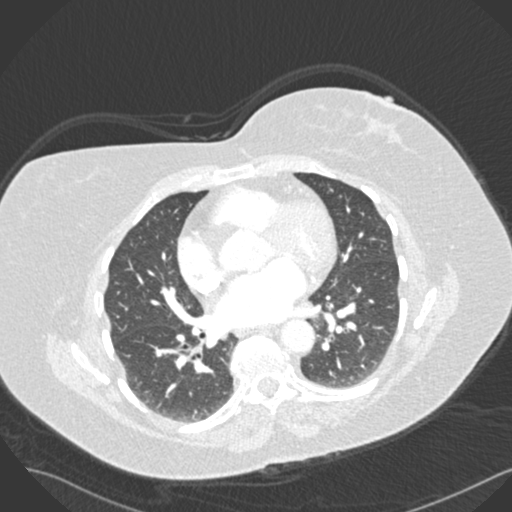
[im 166/315  mediastinal]
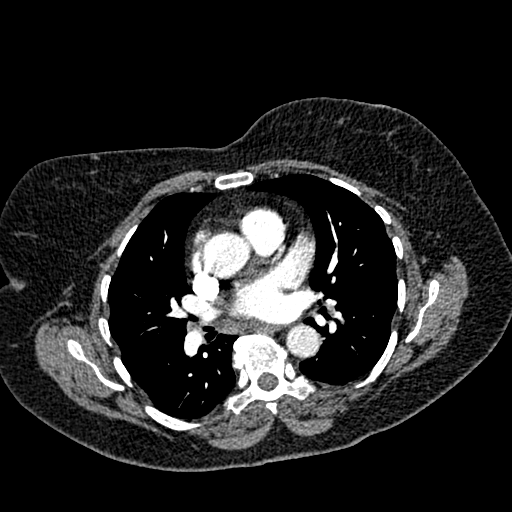
[im 182/315  lung]
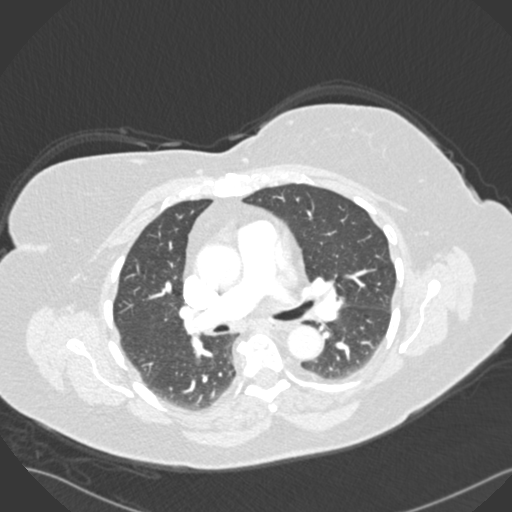
[im 199/315  mediastinal]
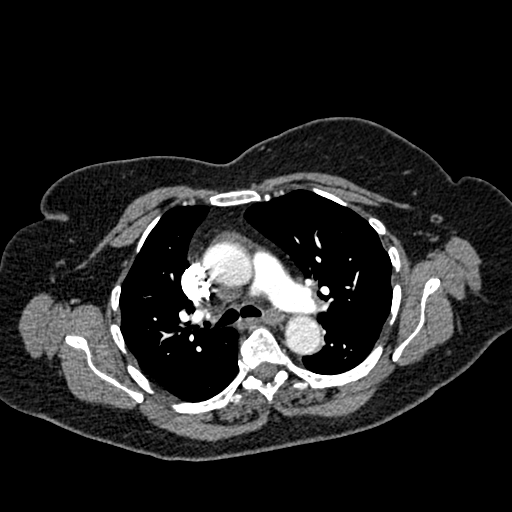
[im 215/315  lung]
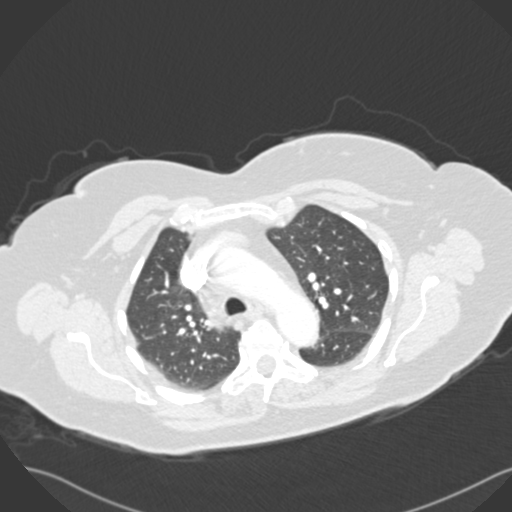
[im 232/315  mediastinal]
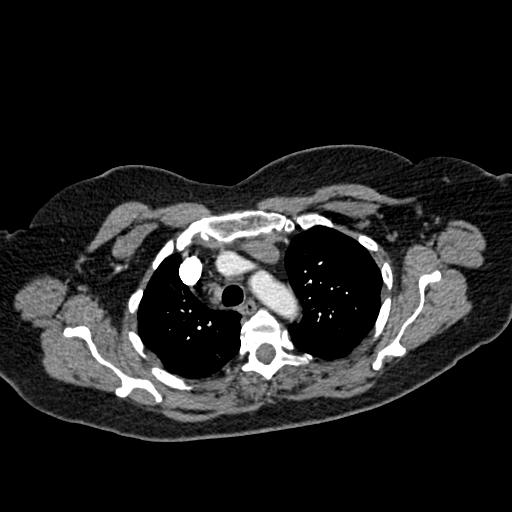
[im 248/315  lung]
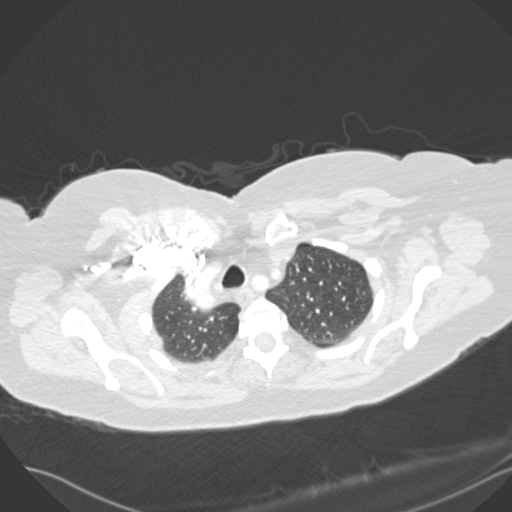
[im 265/315  mediastinal]
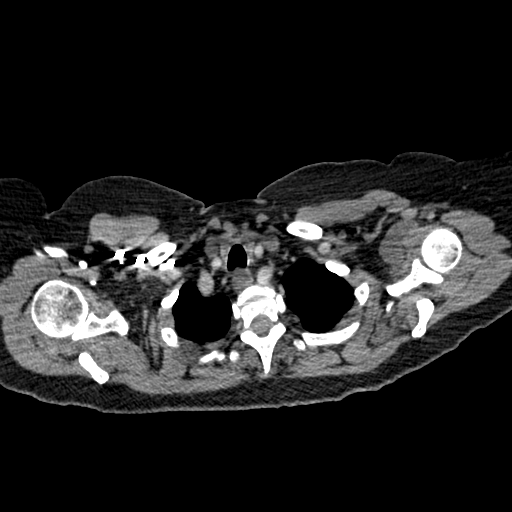
[im 281/315  lung]
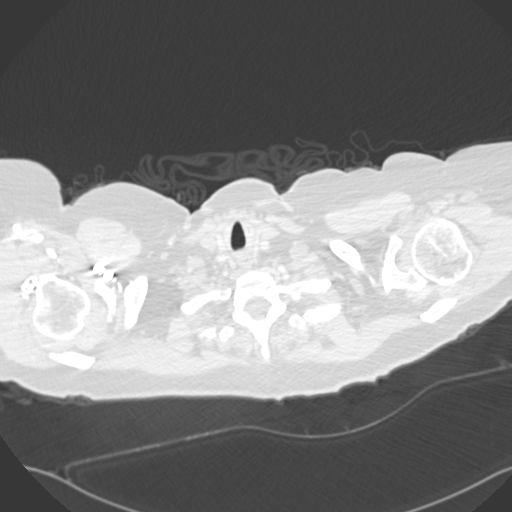
[im 298/315  mediastinal]
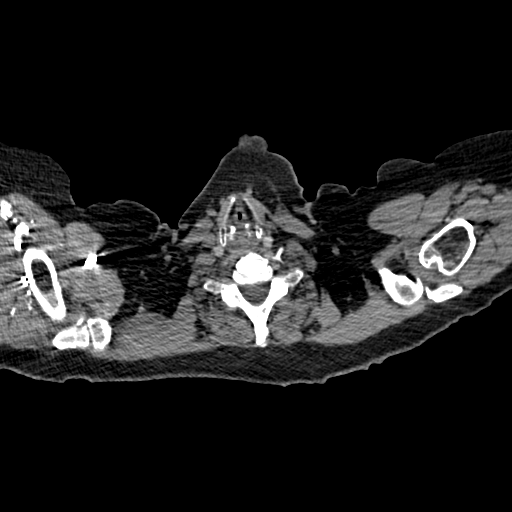

[19 of 36 positions shown; findings below may reference images not displayed]

FINDINGS: Cardiovascular: Satisfactory opacification of the pulmonary arteries
to the segmental level. No evidence of pulmonary embolism. Normal
heart size. No pericardial effusion. Overall mild atherosclerotic
calcification of the aorta.

Mediastinum/Nodes: Negative for adenopathy or mediastinal mass. On
the uppermost slices, the vallecula is covered and has unexpected
soft tissue density. Although lingual tonsil with partial averaging
could give this appearance, there also appears to be some fat
effacement at the level of the hyoid.

Lungs/Pleura: Lungs are clear. No pleural effusion or pneumothorax.

Upper Abdomen: No acute or aggressive finding. 2 hepatic cysts, up
to 4 cm in the right liver.

Musculoskeletal: Spondylosis and dextroscoliosis of the thoracic
spine.

Review of the MIP images confirms the above findings.
IMPRESSION: 1. Negative for pulmonary embolism or other acute finding.
2. Partially covered soft tissue at the vallecula is likely volume
averaging of posterior tongue/lingual tonsil but a mass cannot be
excluded. This area could be visually inspected or evaluated with
neck CT.

## 2018-10-24 DIAGNOSIS — J302 Other seasonal allergic rhinitis: Secondary | ICD-10-CM | POA: Diagnosis not present

## 2018-10-24 DIAGNOSIS — J984 Other disorders of lung: Secondary | ICD-10-CM | POA: Diagnosis not present

## 2018-10-24 DIAGNOSIS — I10 Essential (primary) hypertension: Secondary | ICD-10-CM | POA: Diagnosis not present

## 2018-11-03 DIAGNOSIS — M5412 Radiculopathy, cervical region: Secondary | ICD-10-CM | POA: Diagnosis not present

## 2018-11-03 DIAGNOSIS — M50321 Other cervical disc degeneration at C4-C5 level: Secondary | ICD-10-CM | POA: Diagnosis not present

## 2018-11-03 DIAGNOSIS — M25511 Pain in right shoulder: Secondary | ICD-10-CM | POA: Diagnosis not present

## 2018-11-03 DIAGNOSIS — M542 Cervicalgia: Secondary | ICD-10-CM | POA: Diagnosis not present

## 2018-11-03 DIAGNOSIS — M50322 Other cervical disc degeneration at C5-C6 level: Secondary | ICD-10-CM | POA: Diagnosis not present

## 2018-11-03 DIAGNOSIS — G8929 Other chronic pain: Secondary | ICD-10-CM | POA: Diagnosis not present

## 2018-11-03 HISTORY — DX: Radiculopathy, cervical region: M54.12

## 2018-11-05 DIAGNOSIS — M4802 Spinal stenosis, cervical region: Secondary | ICD-10-CM | POA: Diagnosis not present

## 2018-11-05 DIAGNOSIS — M47812 Spondylosis without myelopathy or radiculopathy, cervical region: Secondary | ICD-10-CM | POA: Diagnosis not present

## 2018-11-05 DIAGNOSIS — M5412 Radiculopathy, cervical region: Secondary | ICD-10-CM | POA: Diagnosis not present

## 2018-11-23 DIAGNOSIS — J029 Acute pharyngitis, unspecified: Secondary | ICD-10-CM | POA: Diagnosis not present

## 2018-11-23 DIAGNOSIS — M797 Fibromyalgia: Secondary | ICD-10-CM | POA: Diagnosis not present

## 2018-11-23 DIAGNOSIS — Z20828 Contact with and (suspected) exposure to other viral communicable diseases: Secondary | ICD-10-CM | POA: Diagnosis not present

## 2018-11-24 DIAGNOSIS — J302 Other seasonal allergic rhinitis: Secondary | ICD-10-CM | POA: Diagnosis not present

## 2018-11-24 DIAGNOSIS — I10 Essential (primary) hypertension: Secondary | ICD-10-CM | POA: Diagnosis not present

## 2018-11-28 DIAGNOSIS — J069 Acute upper respiratory infection, unspecified: Secondary | ICD-10-CM | POA: Diagnosis not present

## 2018-12-02 DIAGNOSIS — M5001 Cervical disc disorder with myelopathy,  high cervical region: Secondary | ICD-10-CM | POA: Diagnosis not present

## 2018-12-02 DIAGNOSIS — M4712 Other spondylosis with myelopathy, cervical region: Secondary | ICD-10-CM | POA: Diagnosis not present

## 2018-12-02 DIAGNOSIS — M5011 Cervical disc disorder with radiculopathy,  high cervical region: Secondary | ICD-10-CM | POA: Diagnosis not present

## 2018-12-02 DIAGNOSIS — M4312 Spondylolisthesis, cervical region: Secondary | ICD-10-CM | POA: Diagnosis not present

## 2018-12-02 DIAGNOSIS — M4722 Other spondylosis with radiculopathy, cervical region: Secondary | ICD-10-CM | POA: Diagnosis not present

## 2018-12-02 DIAGNOSIS — M5412 Radiculopathy, cervical region: Secondary | ICD-10-CM | POA: Diagnosis not present

## 2018-12-02 DIAGNOSIS — M5030 Other cervical disc degeneration, unspecified cervical region: Secondary | ICD-10-CM | POA: Diagnosis not present

## 2018-12-09 DIAGNOSIS — Z1211 Encounter for screening for malignant neoplasm of colon: Secondary | ICD-10-CM | POA: Diagnosis not present

## 2018-12-11 DIAGNOSIS — M4712 Other spondylosis with myelopathy, cervical region: Secondary | ICD-10-CM | POA: Diagnosis not present

## 2018-12-11 DIAGNOSIS — Z01812 Encounter for preprocedural laboratory examination: Secondary | ICD-10-CM | POA: Diagnosis not present

## 2018-12-11 DIAGNOSIS — M4722 Other spondylosis with radiculopathy, cervical region: Secondary | ICD-10-CM | POA: Diagnosis not present

## 2018-12-17 DIAGNOSIS — M4722 Other spondylosis with radiculopathy, cervical region: Secondary | ICD-10-CM | POA: Diagnosis not present

## 2018-12-17 DIAGNOSIS — K219 Gastro-esophageal reflux disease without esophagitis: Secondary | ICD-10-CM | POA: Diagnosis not present

## 2018-12-17 DIAGNOSIS — E785 Hyperlipidemia, unspecified: Secondary | ICD-10-CM | POA: Diagnosis not present

## 2018-12-17 DIAGNOSIS — M25511 Pain in right shoulder: Secondary | ICD-10-CM | POA: Diagnosis not present

## 2018-12-17 DIAGNOSIS — M4712 Other spondylosis with myelopathy, cervical region: Secondary | ICD-10-CM | POA: Diagnosis not present

## 2018-12-17 DIAGNOSIS — Z79890 Hormone replacement therapy (postmenopausal): Secondary | ICD-10-CM | POA: Diagnosis not present

## 2018-12-17 DIAGNOSIS — Z981 Arthrodesis status: Secondary | ICD-10-CM | POA: Diagnosis not present

## 2018-12-17 DIAGNOSIS — G8929 Other chronic pain: Secondary | ICD-10-CM | POA: Diagnosis not present

## 2018-12-17 DIAGNOSIS — R296 Repeated falls: Secondary | ICD-10-CM | POA: Diagnosis not present

## 2018-12-17 DIAGNOSIS — E669 Obesity, unspecified: Secondary | ICD-10-CM | POA: Diagnosis not present

## 2018-12-17 DIAGNOSIS — M4802 Spinal stenosis, cervical region: Secondary | ICD-10-CM | POA: Diagnosis not present

## 2018-12-17 DIAGNOSIS — R32 Unspecified urinary incontinence: Secondary | ICD-10-CM | POA: Diagnosis not present

## 2018-12-17 DIAGNOSIS — M797 Fibromyalgia: Secondary | ICD-10-CM | POA: Diagnosis not present

## 2018-12-17 DIAGNOSIS — J45909 Unspecified asthma, uncomplicated: Secondary | ICD-10-CM | POA: Diagnosis not present

## 2018-12-17 DIAGNOSIS — I10 Essential (primary) hypertension: Secondary | ICD-10-CM | POA: Diagnosis not present

## 2018-12-17 DIAGNOSIS — E039 Hypothyroidism, unspecified: Secondary | ICD-10-CM | POA: Diagnosis not present

## 2018-12-17 DIAGNOSIS — Z79899 Other long term (current) drug therapy: Secondary | ICD-10-CM | POA: Diagnosis not present

## 2018-12-24 DIAGNOSIS — E039 Hypothyroidism, unspecified: Secondary | ICD-10-CM | POA: Diagnosis not present

## 2018-12-24 DIAGNOSIS — J4540 Moderate persistent asthma, uncomplicated: Secondary | ICD-10-CM | POA: Diagnosis not present

## 2018-12-24 DIAGNOSIS — I10 Essential (primary) hypertension: Secondary | ICD-10-CM | POA: Diagnosis not present

## 2019-01-05 DIAGNOSIS — T2124XA Burn of second degree of lower back, initial encounter: Secondary | ICD-10-CM | POA: Diagnosis not present

## 2019-01-05 DIAGNOSIS — N3001 Acute cystitis with hematuria: Secondary | ICD-10-CM | POA: Diagnosis not present

## 2019-01-05 DIAGNOSIS — I10 Essential (primary) hypertension: Secondary | ICD-10-CM | POA: Diagnosis not present

## 2019-01-05 DIAGNOSIS — Z6841 Body Mass Index (BMI) 40.0 and over, adult: Secondary | ICD-10-CM | POA: Diagnosis not present

## 2019-01-15 DIAGNOSIS — Z09 Encounter for follow-up examination after completed treatment for conditions other than malignant neoplasm: Secondary | ICD-10-CM

## 2019-01-15 HISTORY — DX: Encounter for follow-up examination after completed treatment for conditions other than malignant neoplasm: Z09

## 2019-01-23 DIAGNOSIS — E039 Hypothyroidism, unspecified: Secondary | ICD-10-CM | POA: Diagnosis not present

## 2019-01-23 DIAGNOSIS — I10 Essential (primary) hypertension: Secondary | ICD-10-CM | POA: Diagnosis not present

## 2019-04-20 DIAGNOSIS — M7062 Trochanteric bursitis, left hip: Secondary | ICD-10-CM | POA: Diagnosis not present

## 2019-04-26 DIAGNOSIS — Z6841 Body Mass Index (BMI) 40.0 and over, adult: Secondary | ICD-10-CM | POA: Diagnosis not present

## 2019-04-26 DIAGNOSIS — N3000 Acute cystitis without hematuria: Secondary | ICD-10-CM | POA: Diagnosis not present

## 2019-04-26 DIAGNOSIS — M545 Low back pain: Secondary | ICD-10-CM | POA: Diagnosis not present

## 2019-05-03 DIAGNOSIS — Z6841 Body Mass Index (BMI) 40.0 and over, adult: Secondary | ICD-10-CM | POA: Diagnosis not present

## 2019-05-03 DIAGNOSIS — N3000 Acute cystitis without hematuria: Secondary | ICD-10-CM | POA: Diagnosis not present

## 2019-05-03 DIAGNOSIS — R0683 Snoring: Secondary | ICD-10-CM | POA: Diagnosis not present

## 2019-05-17 DIAGNOSIS — G4733 Obstructive sleep apnea (adult) (pediatric): Secondary | ICD-10-CM | POA: Diagnosis not present

## 2019-05-17 DIAGNOSIS — R600 Localized edema: Secondary | ICD-10-CM | POA: Diagnosis not present

## 2019-05-17 DIAGNOSIS — J4540 Moderate persistent asthma, uncomplicated: Secondary | ICD-10-CM | POA: Diagnosis not present

## 2019-05-17 DIAGNOSIS — R5383 Other fatigue: Secondary | ICD-10-CM | POA: Diagnosis not present

## 2019-05-17 DIAGNOSIS — S59911A Unspecified injury of right forearm, initial encounter: Secondary | ICD-10-CM | POA: Diagnosis not present

## 2019-05-17 DIAGNOSIS — J301 Allergic rhinitis due to pollen: Secondary | ICD-10-CM | POA: Diagnosis not present

## 2019-05-17 DIAGNOSIS — R0600 Dyspnea, unspecified: Secondary | ICD-10-CM | POA: Diagnosis not present

## 2019-05-17 DIAGNOSIS — N3000 Acute cystitis without hematuria: Secondary | ICD-10-CM | POA: Diagnosis not present

## 2019-05-17 DIAGNOSIS — E669 Obesity, unspecified: Secondary | ICD-10-CM | POA: Diagnosis not present

## 2019-05-17 DIAGNOSIS — Z6841 Body Mass Index (BMI) 40.0 and over, adult: Secondary | ICD-10-CM | POA: Diagnosis not present

## 2019-05-24 ENCOUNTER — Encounter: Payer: Self-pay | Admitting: Cardiology

## 2019-05-25 DIAGNOSIS — G4733 Obstructive sleep apnea (adult) (pediatric): Secondary | ICD-10-CM | POA: Diagnosis not present

## 2019-06-01 NOTE — Progress Notes (Signed)
Cardiology Office Note:    Date:  06/02/2019   ID:  [name], DOB 13-Apr-1946, MRN [id number]  PCP:  Peggy Grammes, MD  Cardiologist:  Shirlee More, MD    Referring MD: Peggy Grammes, MD    ASSESSMENT:    1. Bilateral lower extremity edema   2. Essential hypertension   3. Paroxysmal SVT (supraventricular tachycardia) (HCC)    PLAN:    In order of problems listed above:  1. 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  We will stop the drugs and she will follow through with a sleep apnea specialist office will call to access labs but she told me repeat proBNP level was normal and will repeat echocardiogram in particular looking for evidence of pulmonary artery hypertension.  For now continue her loop diuretic but I suspect the real treatment for this problem will be CPAP. 2. Stable hypertension for now continue a diuretic and leave her off calcium channel blocker 3. Stable no clinical recurrence I am not can put her on antiarrhythmic drug or beta-blocker at this time   Next appointment: 3 months   Medication Adjustments/Labs and Tests Ordered: Current medicines are reviewed at length with the patient today.  Concerns regarding medicines are outlined above.  No orders of the defined types were placed in this encounter.  No orders of the defined types were placed in this encounter.   Chief Complaint  Patient presents with  . Follow-up    for edema    History of Present Illness:    Peggy Jennings is a 73 y.o. female with a hx of SVT with EP mapping and ablation and HTN  last seen 07/06/2018.  At that time she had marked lower extremity edema I was concerned that she had either heart failure or pseudo-heart failure due to obstructive sleep apnea and restrictive lung disease due to obesity with a BMI exceeding 40.  At that  time her BNP level was low at 52 and renal function was normal.  Echocardiogram performed 08/19/2018 that showed normal left ventricular size and function ejection fraction 65 to 70% and no significant valvular abnormality or pulmonary artery hypertension.  Her 12/11/2018 renal function was normal creatinine 0.81 potassium 4.3 and CBC was normal. Compliance with diet, lifestyle and medications: Yes  She has chronic edema that was not due to heart failure and is markedly worsened.  She takes 2 drug stable and sodium retention meloxicam and diltiazem and I suspect she has severe untreated sleep apnea and is being seen by a sleep specialist the end of this week she snores so severely her husband has moved to another room and wears earplugs at night.  She has daytime fatigue but does not sleep excessively during the day she is very bothered by this and also notices at night when she gets up and walks to the bathroom the length of the hallway she is breathless she has edema but no orthopnea no chest pain or syncope she has had palpitation but not severe sustained.  Lab work was done at her PCP office including a proBNP level the results are not available have my office call for it.  I will recheck an echocardiogram but I suspect the issues here and not congestive heart failure and for the time being she will continue her loop diuretic to try to alleviate her symptoms. Past Medical History:  Diagnosis Date  .  Allergic rhinitis 02/21/2016  . Arthritis 02/21/2016  . Asthma   . Asthma 02/21/2016  . Blood transfusion   . Cystocele 12/19/2015  . Depression   . Dysrhythmia    Hx of atrial fib. See progress note.   . Dysuria 08/04/2017  . Edema of both lower extremities 07/06/2018  . Fibromyalgia   . GERD (gastroesophageal reflux disease)   . History of anemia   . History of atrial fibrillation    ablation performed in 2007.  Marland Kitchen History of bronchitis   . HTN (hypertension) 02/07/2012  . Hyperlipemia   .  Hyperlipidemia 02/10/2012  . Hypertension   . Hypothyroid 02/21/2016  . Intermittent constipation 02/21/2016  . Knee pain 01/21/2013  . Lesion of bladder 12/19/2015  . Obesity 07/02/2016  . Paroxysmal SVT (supraventricular tachycardia) ([place]) 08/21/2016  . Periprosthetic fracture around internal prosthetic right knee joint 02/07/2012  . Recurrent upper respiratory infection (URI)   . Right knee pain 01/03/2014  . Shortness of breath   . SOB (shortness of breath) on exertion 07/06/2018  . Status post revision of total knee replacement 02/07/2012  . Trochanteric bursitis of left hip 06/11/2018  . Urinary incontinence 12/19/2015  . Urinary tract infection 2 months ago     Past Surgical History:  Procedure Laterality Date  . ABDOMINAL HYSTERECTOMY  1977  . ACHILLES TENDON REPAIR    . APPENDECTOMY  1960  . CARDIAC ELECTROPHYSIOLOGY STUDY AND ABLATION    . ESOPHAGOGASTRODUODENOSCOPY    . FINGER SURGERY     left index finger, right thumb, cyst removal  . FOOT SURGERY Left    Heel spur  . JOINT REPLACEMENT  2-3 years   Right Hip  . JOINT REPLACEMENT  3-4 years ago   Left Hip  . JOINT REPLACEMENT  4 years ago    Right Knee  . JOINT REPLACEMENT  10 years ago    Left Knee  . JOINT REPLACEMENT  01/20/2012   Right knee  . NECK SURGERY    . ORIF FEMUR FRACTURE  01/2012  . ORIF FEMUR FRACTURE  02/06/2012   Procedure: OPEN REDUCTION INTERNAL FIXATION (ORIF) DISTAL FEMUR FRACTURE;  Surgeon: Rozanna Box, MD;  Location: [facility];  Service: Orthopedics;  Laterality: Right;  . PLANTAR FASCIA SURGERY  7-8 Years    Right Foot  . TOTAL KNEE REVISION  01/20/2012   Procedure: TOTAL KNEE REVISION;  Surgeon: Rudean Haskell, MD;  Location: [place];  Service: Orthopedics;  Laterality: Right;    Current Medications: Current Meds  Medication Sig  . albuterol (PROAIR HFA) 108 (90 Base) MCG/ACT inhaler Inhale 1 puff into the lungs.   Marland Kitchen CALCIUM PO Take 1 tablet by mouth daily.   . Cholecalciferol (VITAMIN D3) 400  units CAPS Take 400 Units by mouth daily.  . cloNIDine (CATAPRES) 0.1 MG tablet Take 0.1 mg 2 (two) times daily by mouth.   . cyanocobalamin 100 MCG tablet Take 100 mcg by mouth daily.  . dilTIAZem HCl Coated Beads (DILTIAZEM HCL ER COATED BEADS PO) Take 180 mg by mouth daily.  . fluticasone furoate-vilanterol (BREO ELLIPTA) 200-25 MCG/INH AEPB Inhale 1 puff into the lungs daily.  . furosemide (LASIX) 20 MG tablet Take 1 tablet by mouth daily.  Marland Kitchen levocetirizine (XYZAL) 5 MG tablet Take 5 mg by mouth daily.  Marland Kitchen levothyroxine (SYNTHROID, LEVOTHROID) 100 MCG tablet Take 100 mcg by mouth daily before breakfast.   . meloxicam (MOBIC) 15 MG tablet Take 15 mg by mouth daily.   Marland Kitchen  mometasone (NASONEX) 50 MCG/ACT nasal spray Place 2 sprays into the nose daily.  . montelukast (SINGULAIR) 10 MG tablet Take 10 mg by mouth at bedtime.  . Multiple Vitamin (MULTI-VITAMINS) TABS Take 1 tablet by mouth daily.   . pantoprazole (PROTONIX) 40 MG tablet Take 40 mg by mouth daily.   Marland Kitchen telmisartan (MICARDIS) 80 MG tablet Take 80 mg by mouth daily.   Marland Kitchen tiZANidine (ZANAFLEX) 2 MG tablet Take 1 tablet by mouth every 8 (eight) hours as needed.     Allergies:   Ace inhibitors, Atorvastatin, Oxycodone-acetaminophen, Percocet [oxycodone-acetaminophen], and Ramipril   Social History   Socioeconomic History  . Marital status: Married    Spouse name: Not on file  . Number of children: Not on file  . Years of education: Not on file  . Highest education level: Not on file  Occupational History  . Not on file  Social Needs  . Financial resource strain: Not on file  . Food insecurity    Worry: Not on file    Inability: Not on file  . Transportation needs    Medical: Not on file    Non-medical: Not on file  Tobacco Use  . Smoking status: Never Smoker  . Smokeless tobacco: Never Used  Substance and Sexual Activity  . Alcohol use: No  . Drug use: Never  . Sexual activity: Not on file  Lifestyle  . Physical  activity    Days per week: Not on file    Minutes per session: Not on file  . Stress: Not on file  Relationships  . Social Herbalist on phone: Not on file    Gets together: Not on file    Attends religious service: Not on file    Active member of club or organization: Not on file    Attends meetings of clubs or organizations: Not on file    Relationship status: Not on file  Other Topics Concern  . Not on file  Social History Narrative  . Not on file     Family History: The patient's family history includes Hypertension in her daughter, father, and mother; Stroke in her father and mother. There is no history of Anesthesia problems, Hypotension, Malignant hyperthermia, or Pseudochol deficiency. ROS:   Please see the history of present illness.    All other systems reviewed and are negative.  EKGs/Labs/Other Studies Reviewed:    The following studies were reviewed today:  Recent Labs: None 05/18/2019 she had a proBNP level thyroid studies performed at PCP office unavailable on K PN we requested a copy Recent Lipid Panel    Component Value Date/Time   CHOL 259 (H) 10/06/2017 1002   TRIG 232 (H) 10/06/2017 1002   HDL 51 10/06/2017 1002   CHOLHDL 5.1 (H) 10/06/2017 1002   LDLCALC 162 (H) 10/06/2017 1002    Physical Exam:    VS:  BP 132/70 (BP Location: Left Arm, Patient Position: Sitting, Cuff Size: Large)   Pulse 72   Temp 97.9 F (36.6 C)   Ht 5\' 6"  (1.676 m)   Wt 268 lb (121.6 kg)   SpO2 96%   BMI 43.26 kg/m     Wt Readings from Last 3 Encounters:  06/02/19 268 lb (121.6 kg)  05/17/19 262 lb (118.8 kg)  07/06/18 254 lb 9.6 oz (115.5 kg)     GEN:  Well nourished, well developed in no acute distress HEENT: Normal NECK: No JVD; No carotid bruits LYMPHATICS: No lymphadenopathy CARDIAC:  RRR, no murmurs, rubs, gallops RESPIRATORY:  Clear to auscultation without rales, wheezing or rhonchi  ABDOMEN: Soft, non-tender, non-distended MUSCULOSKELETAL: 4+  bilateral above-the-knee lower extremity edema; No deformity  SKIN: Warm and dry NEUROLOGIC:  Alert and oriented x 3 PSYCHIATRIC:  Normal affect    Signed, Shirlee More, MD  06/02/2019 3:46 PM    [facility]

## 2019-06-02 ENCOUNTER — Other Ambulatory Visit: Payer: Self-pay

## 2019-06-02 ENCOUNTER — Ambulatory Visit (INDEPENDENT_AMBULATORY_CARE_PROVIDER_SITE_OTHER): Payer: PPO | Admitting: Cardiology

## 2019-06-02 ENCOUNTER — Encounter: Payer: Self-pay | Admitting: Cardiology

## 2019-06-02 ENCOUNTER — Encounter: Payer: Self-pay | Admitting: *Deleted

## 2019-06-02 VITALS — BP 132/70 | HR 72 | Temp 97.9°F | Ht 66.0 in | Wt 268.0 lb

## 2019-06-02 DIAGNOSIS — R0602 Shortness of breath: Secondary | ICD-10-CM

## 2019-06-02 DIAGNOSIS — I10 Essential (primary) hypertension: Secondary | ICD-10-CM

## 2019-06-02 DIAGNOSIS — I471 Supraventricular tachycardia: Secondary | ICD-10-CM

## 2019-06-02 DIAGNOSIS — R600 Localized edema: Secondary | ICD-10-CM | POA: Diagnosis not present

## 2019-06-02 NOTE — Patient Instructions (Addendum)
Medication Instructions:  Your physician has recommended you make the following change in your medication:   STOP meloxicam (mobic)  STOP diltiazem    If you need a refill on your cardiac medications before your next appointment, please call your pharmacy.   Lab work: None  If you have labs (blood work) drawn today and your tests are completely normal, you will receive your results only by: Marland Kitchen MyChart Message (if you have MyChart) OR . A paper copy in the mail If you have any lab test that is abnormal or we need to change your treatment, we will call you to review the results.  Testing/Procedures: Your physician has requested that you have an echocardiogram. Echocardiography is a painless test that uses sound waves to create images of your heart. It provides your doctor with information about the size and shape of your heart and how well your heart's chambers and valves are working. This procedure takes approximately one hour. There are no restrictions for this procedure.  Follow-Up: At [facility], you and your health needs are our priority.  As part of our continuing mission to provide you with exceptional heart care, we have created designated Provider Care Teams.  These Care Teams include your primary Cardiologist (physician) and Advanced Practice Providers (APPs -  Physician Assistants and Nurse Practitioners) who all work together to provide you with the care you need, when you need it. You will need a follow up appointment in 3 months.         Echocardiogram An echocardiogram is a procedure that uses painless sound waves (ultrasound) to produce an image of the heart. Images from an echocardiogram can provide important information about:  Signs of coronary artery disease (CAD).  Aneurysm detection. An aneurysm is a weak or damaged part of an artery wall that bulges out from the normal force of blood pumping through the body.  Heart size and shape. Changes in the size or  shape of the heart can be associated with certain conditions, including heart failure, aneurysm, and CAD.  Heart muscle function.  Heart valve function.  Signs of a past heart attack.  Fluid buildup around the heart.  Thickening of the heart muscle.  A tumor or infectious growth around the heart valves. Tell a health care provider about:  Any allergies you have.  All medicines you are taking, including vitamins, herbs, eye drops, creams, and over-the-counter medicines.  Any blood disorders you have.  Any surgeries you have had.  Any medical conditions you have.  Whether you are pregnant or may be pregnant. What are the risks? Generally, this is a safe procedure. However, problems may occur, including:  Allergic reaction to dye (contrast) that may be used during the procedure. What happens before the procedure? No specific preparation is needed. You may eat and drink normally. What happens during the procedure?   An IV tube may be inserted into one of your veins.  You may receive contrast through this tube. A contrast is an injection that improves the quality of the pictures from your heart.  A gel will be applied to your chest.  A wand-like tool (transducer) will be moved over your chest. The gel will help to transmit the sound waves from the transducer.  The sound waves will harmlessly bounce off of your heart to allow the heart images to be captured in real-time motion. The images will be recorded on a computer. The procedure may vary among health care providers and hospitals. What happens  after the procedure?  You may return to your normal, everyday life, including diet, activities, and medicines, unless your health care provider tells you not to do that. Summary  An echocardiogram is a procedure that uses painless sound waves (ultrasound) to produce an image of the heart.  Images from an echocardiogram can provide important information about the size and shape  of your heart, heart muscle function, heart valve function, and fluid buildup around your heart.  You do not need to do anything to prepare before this procedure. You may eat and drink normally.  After the echocardiogram is completed, you may return to your normal, everyday life, unless your health care provider tells you not to do that. This information is not intended to replace advice given to you by your health care provider. Make sure you discuss any questions you have with your health care provider. Document Released: 11/08/2000 Document Revised: 03/04/2019 Document Reviewed: 12/14/2016 Elsevier Patient Education  2020 [employer].

## 2019-06-04 DIAGNOSIS — J301 Allergic rhinitis due to pollen: Secondary | ICD-10-CM | POA: Diagnosis not present

## 2019-06-04 DIAGNOSIS — R0600 Dyspnea, unspecified: Secondary | ICD-10-CM | POA: Diagnosis not present

## 2019-06-04 DIAGNOSIS — R5383 Other fatigue: Secondary | ICD-10-CM | POA: Diagnosis not present

## 2019-06-04 DIAGNOSIS — J4540 Moderate persistent asthma, uncomplicated: Secondary | ICD-10-CM | POA: Diagnosis not present

## 2019-06-04 DIAGNOSIS — G4733 Obstructive sleep apnea (adult) (pediatric): Secondary | ICD-10-CM | POA: Diagnosis not present

## 2019-06-10 DIAGNOSIS — G4733 Obstructive sleep apnea (adult) (pediatric): Secondary | ICD-10-CM | POA: Diagnosis not present

## 2019-06-16 DIAGNOSIS — J301 Allergic rhinitis due to pollen: Secondary | ICD-10-CM | POA: Diagnosis not present

## 2019-06-16 DIAGNOSIS — R0600 Dyspnea, unspecified: Secondary | ICD-10-CM | POA: Diagnosis not present

## 2019-06-16 DIAGNOSIS — G4733 Obstructive sleep apnea (adult) (pediatric): Secondary | ICD-10-CM | POA: Diagnosis not present

## 2019-06-16 DIAGNOSIS — J4540 Moderate persistent asthma, uncomplicated: Secondary | ICD-10-CM | POA: Diagnosis not present

## 2019-06-16 DIAGNOSIS — R5383 Other fatigue: Secondary | ICD-10-CM | POA: Diagnosis not present

## 2019-06-21 ENCOUNTER — Ambulatory Visit (INDEPENDENT_AMBULATORY_CARE_PROVIDER_SITE_OTHER): Payer: PPO

## 2019-06-21 ENCOUNTER — Other Ambulatory Visit: Payer: Self-pay

## 2019-06-21 DIAGNOSIS — R0602 Shortness of breath: Secondary | ICD-10-CM | POA: Diagnosis not present

## 2019-06-21 DIAGNOSIS — R600 Localized edema: Secondary | ICD-10-CM | POA: Diagnosis not present

## 2019-06-21 DIAGNOSIS — I471 Supraventricular tachycardia: Secondary | ICD-10-CM | POA: Diagnosis not present

## 2019-06-21 DIAGNOSIS — I10 Essential (primary) hypertension: Secondary | ICD-10-CM | POA: Diagnosis not present

## 2019-06-21 NOTE — Progress Notes (Signed)
Complete echocardiogram has been performed.  Jimmy Ulis Kaps RDCS, RVT 

## 2019-06-25 DIAGNOSIS — G4733 Obstructive sleep apnea (adult) (pediatric): Secondary | ICD-10-CM | POA: Diagnosis not present

## 2019-07-08 DIAGNOSIS — E039 Hypothyroidism, unspecified: Secondary | ICD-10-CM | POA: Diagnosis not present

## 2019-07-08 DIAGNOSIS — R609 Edema, unspecified: Secondary | ICD-10-CM | POA: Diagnosis not present

## 2019-07-08 DIAGNOSIS — Z9181 History of falling: Secondary | ICD-10-CM | POA: Diagnosis not present

## 2019-07-08 DIAGNOSIS — Z6841 Body Mass Index (BMI) 40.0 and over, adult: Secondary | ICD-10-CM | POA: Diagnosis not present

## 2019-07-08 DIAGNOSIS — Z79899 Other long term (current) drug therapy: Secondary | ICD-10-CM | POA: Diagnosis not present

## 2019-07-08 DIAGNOSIS — F4321 Adjustment disorder with depressed mood: Secondary | ICD-10-CM | POA: Diagnosis not present

## 2019-07-08 DIAGNOSIS — R635 Abnormal weight gain: Secondary | ICD-10-CM | POA: Diagnosis not present

## 2019-07-08 DIAGNOSIS — R6889 Other general symptoms and signs: Secondary | ICD-10-CM | POA: Diagnosis not present

## 2019-07-08 DIAGNOSIS — E669 Obesity, unspecified: Secondary | ICD-10-CM | POA: Diagnosis not present

## 2019-07-08 DIAGNOSIS — G4733 Obstructive sleep apnea (adult) (pediatric): Secondary | ICD-10-CM | POA: Diagnosis not present

## 2019-07-26 DIAGNOSIS — G4733 Obstructive sleep apnea (adult) (pediatric): Secondary | ICD-10-CM | POA: Diagnosis not present

## 2019-08-10 DIAGNOSIS — E039 Hypothyroidism, unspecified: Secondary | ICD-10-CM | POA: Diagnosis not present

## 2019-08-10 DIAGNOSIS — R301 Vesical tenesmus: Secondary | ICD-10-CM | POA: Diagnosis not present

## 2019-08-10 DIAGNOSIS — F4321 Adjustment disorder with depressed mood: Secondary | ICD-10-CM | POA: Diagnosis not present

## 2019-08-10 DIAGNOSIS — G4733 Obstructive sleep apnea (adult) (pediatric): Secondary | ICD-10-CM | POA: Diagnosis not present

## 2019-08-10 DIAGNOSIS — Z0000 Encounter for general adult medical examination without abnormal findings: Secondary | ICD-10-CM | POA: Diagnosis not present

## 2019-08-10 DIAGNOSIS — Z79899 Other long term (current) drug therapy: Secondary | ICD-10-CM | POA: Diagnosis not present

## 2019-08-10 DIAGNOSIS — Z23 Encounter for immunization: Secondary | ICD-10-CM | POA: Diagnosis not present

## 2019-08-10 DIAGNOSIS — K219 Gastro-esophageal reflux disease without esophagitis: Secondary | ICD-10-CM | POA: Diagnosis not present

## 2019-08-10 DIAGNOSIS — J302 Other seasonal allergic rhinitis: Secondary | ICD-10-CM | POA: Diagnosis not present

## 2019-08-10 DIAGNOSIS — E669 Obesity, unspecified: Secondary | ICD-10-CM | POA: Diagnosis not present

## 2019-08-10 DIAGNOSIS — Z13220 Encounter for screening for lipoid disorders: Secondary | ICD-10-CM | POA: Diagnosis not present

## 2019-08-10 DIAGNOSIS — I10 Essential (primary) hypertension: Secondary | ICD-10-CM | POA: Diagnosis not present

## 2019-08-11 DIAGNOSIS — J4540 Moderate persistent asthma, uncomplicated: Secondary | ICD-10-CM | POA: Diagnosis not present

## 2019-08-11 DIAGNOSIS — G4733 Obstructive sleep apnea (adult) (pediatric): Secondary | ICD-10-CM | POA: Diagnosis not present

## 2019-08-11 DIAGNOSIS — J301 Allergic rhinitis due to pollen: Secondary | ICD-10-CM | POA: Diagnosis not present

## 2019-08-11 DIAGNOSIS — R5383 Other fatigue: Secondary | ICD-10-CM | POA: Diagnosis not present

## 2019-08-25 DIAGNOSIS — G4733 Obstructive sleep apnea (adult) (pediatric): Secondary | ICD-10-CM | POA: Diagnosis not present

## 2019-09-07 ENCOUNTER — Ambulatory Visit: Payer: PPO | Admitting: Cardiology

## 2019-09-21 DIAGNOSIS — R399 Unspecified symptoms and signs involving the genitourinary system: Secondary | ICD-10-CM | POA: Diagnosis not present

## 2019-09-25 DIAGNOSIS — G4733 Obstructive sleep apnea (adult) (pediatric): Secondary | ICD-10-CM | POA: Diagnosis not present

## 2019-09-28 DIAGNOSIS — N6452 Nipple discharge: Secondary | ICD-10-CM | POA: Diagnosis not present

## 2019-09-28 DIAGNOSIS — G4733 Obstructive sleep apnea (adult) (pediatric): Secondary | ICD-10-CM | POA: Diagnosis not present

## 2019-09-28 DIAGNOSIS — Z6841 Body Mass Index (BMI) 40.0 and over, adult: Secondary | ICD-10-CM | POA: Diagnosis not present

## 2019-10-07 NOTE — Progress Notes (Signed)
Cardiology Office Note:    Date:  10/08/2019   ID:  Peggy Jennings, DOB 1946-09-22, MRN [id number]  PCP:  Serita Grammes, MD  Cardiologist:  Shirlee More, MD    Referring MD: Serita Grammes, MD    ASSESSMENT:    1. Paroxysmal SVT (supraventricular tachycardia) ([place])   2. Essential hypertension   3. BMI 40.0-44.9, adult ([place])   4. Obstructive sleep apnea    PLAN:    In order of problems listed above:  1. Stable no recurrence after EP catheter ablation. Stable BP at target continue current treatment including clonidine diuretic and ARB.  Strongly encouraged her to consider bariatric surgery that would be remarkably effective for hypertension chronic venous insufficiency and sleep apnea. Recommended bariatric surgery Managed by pulmonary with CPAP  Next appointment: 6 months   Medication Adjustments/Labs and Tests Ordered: Current medicines are reviewed at length with the patient today.  Concerns regarding medicines are outlined above.  Orders Placed This Encounter  Procedures  . EKG 12-Lead   No orders of the defined types were placed in this encounter.   Chief Complaint  Patient presents with  . Follow-up    for SVT  . Leg Swelling  . Hypertension    History of Present Illness:    Peggy Jennings is a 73 y.o. female with a hx of SVT with EP mapping and ablation and HTN  seen 07/06/2018.  At that time she had marked lower extremity edema I was concerned that she had either heart failure or pseudo-heart failure due to obstructive sleep apnea and restrictive lung disease due to obesity with a BMI exceeding 40.  At that time her BNP level was low at 52 and renal function was normal.  Echocardiogram performed 08/19/2018 that showed normal left ventricular size and function ejection fraction 65 to 70% and no significant valvular abnormality or pulmonary artery hypertension. She has obstructive sleep apnea. She was last seen 06/02/2019.Marland Kitchen Compliance with diet,  lifestyle and medications:  Past Medical History:  Diagnosis Date  . Allergic rhinitis 02/21/2016  . Arthritis 02/21/2016  . Asthma   . Asthma 02/21/2016  . Blood transfusion   . Cystocele 12/19/2015  . Depression   . Dysrhythmia    Hx of atrial fib. See progress note.   . Dysuria 08/04/2017  . Edema of both lower extremities 07/06/2018  . Fibromyalgia   . GERD (gastroesophageal reflux disease)   . History of anemia   . History of atrial fibrillation    ablation performed in 2007.  Marland Kitchen History of bronchitis   . HTN (hypertension) 02/07/2012  . Hyperlipemia   . Hyperlipidemia 02/10/2012  . Hypertension   . Hypothyroid 02/21/2016  . Intermittent constipation 02/21/2016  . Knee pain 01/21/2013  . Lesion of bladder 12/19/2015  . Obesity 07/02/2016  . Paroxysmal SVT (supraventricular tachycardia) ([place]) 08/21/2016  . Periprosthetic fracture around internal prosthetic right knee joint 02/07/2012  . Recurrent upper respiratory infection (URI)   . Right knee pain 01/03/2014  . Shortness of breath   . SOB (shortness of breath) on exertion 07/06/2018  . Status post revision of total knee replacement 02/07/2012  . Trochanteric bursitis of left hip 06/11/2018  . Urinary incontinence 12/19/2015  . Urinary tract infection 2 months ago     Past Surgical History:  Procedure Laterality Date  . ABDOMINAL HYSTERECTOMY  1977  . ACHILLES TENDON REPAIR    . APPENDECTOMY  1960  . CARDIAC ELECTROPHYSIOLOGY STUDY AND ABLATION    .  ESOPHAGOGASTRODUODENOSCOPY    . FINGER SURGERY     left index finger, right thumb, cyst removal  . FOOT SURGERY Left    Heel spur  . JOINT REPLACEMENT  2-3 years   Right Hip  . JOINT REPLACEMENT  3-4 years ago   Left Hip  . JOINT REPLACEMENT  4 years ago    Right Knee  . JOINT REPLACEMENT  10 years ago    Left Knee  . JOINT REPLACEMENT  01/20/2012   Right knee  . NECK SURGERY    . ORIF FEMUR FRACTURE  01/2012  . ORIF FEMUR FRACTURE  02/06/2012   Procedure: OPEN REDUCTION  INTERNAL FIXATION (ORIF) DISTAL FEMUR FRACTURE;  Surgeon: Rozanna Box, MD;  Location: [place];  Service: Orthopedics;  Laterality: Right;  . PLANTAR FASCIA SURGERY  7-8 Years    Right Foot  . TOTAL KNEE REVISION  01/20/2012   Procedure: TOTAL KNEE REVISION;  Surgeon: Rudean Haskell, MD;  Location: [place];  Service: Orthopedics;  Laterality: Right;    Current Medications: Current Meds  Medication Sig  . albuterol (PROAIR HFA) 108 (90 Base) MCG/ACT inhaler Inhale 1 puff into the lungs every 6 (six) hours as needed.   Marland Kitchen CALCIUM PO Take 1 tablet by mouth daily.   . Cholecalciferol (VITAMIN D3) 400 units CAPS Take 400 Units by mouth daily.  . cloNIDine (CATAPRES) 0.1 MG tablet Take 0.1 mg 2 (two) times daily by mouth.   . cyanocobalamin 100 MCG tablet Take 100 mcg by mouth daily.  . fluticasone furoate-vilanterol (BREO ELLIPTA) 200-25 MCG/INH AEPB Inhale 1 puff into the lungs daily.  . furosemide (LASIX) 20 MG tablet Take 1 tablet by mouth daily.  Marland Kitchen levocetirizine (XYZAL) 5 MG tablet Take 5 mg by mouth daily.  Marland Kitchen levothyroxine (SYNTHROID, LEVOTHROID) 100 MCG tablet Take 100 mcg by mouth daily before breakfast.   . mometasone (NASONEX) 50 MCG/ACT nasal spray Place 2 sprays into the nose daily.  . montelukast (SINGULAIR) 10 MG tablet Take 10 mg by mouth at bedtime.  . Multiple Vitamin (MULTI-VITAMINS) TABS Take 1 tablet by mouth daily.   Marland Kitchen MYRBETRIQ 50 MG TB24 tablet Take 50 mg by mouth daily.  . pantoprazole (PROTONIX) 40 MG tablet Take 40 mg by mouth daily.   Marland Kitchen telmisartan (MICARDIS) 80 MG tablet Take 80 mg by mouth daily.   Marland Kitchen tiZANidine (ZANAFLEX) 2 MG tablet Take 1 tablet by mouth every 8 (eight) hours as needed.     Allergies:   Ace inhibitors, Atorvastatin, Oxycodone-acetaminophen, Percocet [oxycodone-acetaminophen], and Ramipril   Social History   Socioeconomic History  . Marital status: Married    Spouse name: Not on file  . Number of children: Not on file  . Years of education:  Not on file  . Highest education level: Not on file  Occupational History  . Not on file  Social Needs  . Financial resource strain: Not on file  . Food insecurity    Worry: Not on file    Inability: Not on file  . Transportation needs    Medical: Not on file    Non-medical: Not on file  Tobacco Use  . Smoking status: Never Smoker  . Smokeless tobacco: Never Used  Substance and Sexual Activity  . Alcohol use: No  . Drug use: Never  . Sexual activity: Not on file  Lifestyle  . Physical activity    Days per week: Not on file    Minutes per session: Not on  file  . Stress: Not on file  Relationships  . Social Herbalist on phone: Not on file    Gets together: Not on file    Attends religious service: Not on file    Active member of club or organization: Not on file    Attends meetings of clubs or organizations: Not on file    Relationship status: Not on file  Other Topics Concern  . Not on file  Social History Narrative  . Not on file     Family History: The patient's family history includes Hypertension in her daughter, father, and mother; Stroke in her father and mother. There is no history of Anesthesia problems, Hypotension, Malignant hyperthermia, or Pseudochol deficiency. ROS:   Please see the history of present illness.    All other systems reviewed and are negative.  EKGs/Labs/Other Studies Reviewed:    The following studies were reviewed today:  EKG:  EKG ordered today and personally reviewed.  The ekg ordered today demonstrates sinus rhythm and normal  Recent Labs: No results found for requested labs within last 8760 hours.  Recent Lipid Panel    Component Value Date/Time   CHOL 259 (H) 10/06/2017 1002   TRIG 232 (H) 10/06/2017 1002   HDL 51 10/06/2017 1002   CHOLHDL 5.1 (H) 10/06/2017 1002   LDLCALC 162 (H) 10/06/2017 1002    Physical Exam:    VS:  BP (!) 164/82 (BP Location: Left Arm, Patient Position: Sitting, Cuff Size: Large)    Pulse 76   Ht 5\' 6"  (1.676 m)   Wt 268 lb 6.4 oz (121.7 kg)   SpO2 97%   BMI 43.32 kg/m     Wt Readings from Last 3 Encounters:  10/08/19 268 lb 6.4 oz (121.7 kg)  06/02/19 268 lb (121.6 kg)  05/17/19 262 lb (118.8 kg)     GEN: Marked obesity well nourished, well developed in no acute distress HEENT: Normal NECK: No JVD; No carotid bruits LYMPHATICS: No lymphadenopathy CARDIAC: RRR, no murmurs, rubs, gallops RESPIRATORY:  Clear to auscultation without rales, wheezing or rhonchi  ABDOMEN: Soft, non-tender, non-distended MUSCULOSKELETAL: Bilateral nonpitting lower extremity edema; No deformity  SKIN: Warm and dry NEUROLOGIC:  Alert and oriented x 3 PSYCHIATRIC:  Normal affect    Signed, Shirlee More, MD  10/08/2019 5:12 PM    [facility]

## 2019-10-08 ENCOUNTER — Encounter: Payer: Self-pay | Admitting: Cardiology

## 2019-10-08 ENCOUNTER — Encounter: Payer: Self-pay | Admitting: *Deleted

## 2019-10-08 ENCOUNTER — Other Ambulatory Visit: Payer: Self-pay

## 2019-10-08 ENCOUNTER — Ambulatory Visit (INDEPENDENT_AMBULATORY_CARE_PROVIDER_SITE_OTHER): Payer: PPO | Admitting: Cardiology

## 2019-10-08 VITALS — BP 142/90 | HR 76 | Ht 66.0 in | Wt 268.4 lb

## 2019-10-08 DIAGNOSIS — I471 Supraventricular tachycardia: Secondary | ICD-10-CM | POA: Diagnosis not present

## 2019-10-08 DIAGNOSIS — Z6841 Body Mass Index (BMI) 40.0 and over, adult: Secondary | ICD-10-CM | POA: Diagnosis not present

## 2019-10-08 DIAGNOSIS — I10 Essential (primary) hypertension: Secondary | ICD-10-CM

## 2019-10-08 DIAGNOSIS — G4733 Obstructive sleep apnea (adult) (pediatric): Secondary | ICD-10-CM | POA: Diagnosis not present

## 2019-10-08 NOTE — Patient Instructions (Signed)
Medication Instructions:  Your physician recommends that you continue on your current medications as directed. Please refer to the Current Medication list given to you today.  *If you need a refill on your cardiac medications before your next appointment, please call your pharmacy*  Lab Work: None  If you have labs (blood work) drawn today and your tests are completely normal, you will receive your results only by: . MyChart Message (if you have MyChart) OR . A paper copy in the mail If you have any lab test that is abnormal or we need to change your treatment, we will call you to review the results.  Testing/Procedures: You had an EKG today.   Follow-Up: At [facility], you and your health needs are our priority.  As part of our continuing mission to provide you with exceptional heart care, we have created designated Provider Care Teams.  These Care Teams include your primary Cardiologist (physician) and Advanced Practice Providers (APPs -  Physician Assistants and Nurse Practitioners) who all work together to provide you with the care you need, when you need it.  Your next appointment:   12 months  The format for your next appointment:   In Person  Provider:   Brian Munley, MD   

## 2019-10-13 DIAGNOSIS — R399 Unspecified symptoms and signs involving the genitourinary system: Secondary | ICD-10-CM | POA: Diagnosis not present

## 2019-10-20 DIAGNOSIS — Z8042 Family history of malignant neoplasm of prostate: Secondary | ICD-10-CM | POA: Diagnosis not present

## 2019-10-20 DIAGNOSIS — Z808 Family history of malignant neoplasm of other organs or systems: Secondary | ICD-10-CM | POA: Diagnosis not present

## 2019-10-20 DIAGNOSIS — Z1231 Encounter for screening mammogram for malignant neoplasm of breast: Secondary | ICD-10-CM | POA: Diagnosis not present

## 2019-10-25 DIAGNOSIS — G4733 Obstructive sleep apnea (adult) (pediatric): Secondary | ICD-10-CM | POA: Diagnosis not present

## 2019-11-05 DIAGNOSIS — J4540 Moderate persistent asthma, uncomplicated: Secondary | ICD-10-CM | POA: Diagnosis not present

## 2019-11-05 DIAGNOSIS — G4733 Obstructive sleep apnea (adult) (pediatric): Secondary | ICD-10-CM | POA: Diagnosis not present

## 2019-11-05 DIAGNOSIS — R5383 Other fatigue: Secondary | ICD-10-CM | POA: Diagnosis not present

## 2019-11-05 DIAGNOSIS — J301 Allergic rhinitis due to pollen: Secondary | ICD-10-CM | POA: Diagnosis not present

## 2019-11-11 DIAGNOSIS — E669 Obesity, unspecified: Secondary | ICD-10-CM | POA: Diagnosis not present

## 2019-11-11 DIAGNOSIS — Z6841 Body Mass Index (BMI) 40.0 and over, adult: Secondary | ICD-10-CM | POA: Diagnosis not present

## 2019-11-11 DIAGNOSIS — I10 Essential (primary) hypertension: Secondary | ICD-10-CM | POA: Diagnosis not present

## 2019-11-11 DIAGNOSIS — F4321 Adjustment disorder with depressed mood: Secondary | ICD-10-CM | POA: Diagnosis not present

## 2019-11-11 DIAGNOSIS — M25511 Pain in right shoulder: Secondary | ICD-10-CM | POA: Diagnosis not present

## 2019-11-11 DIAGNOSIS — M19011 Primary osteoarthritis, right shoulder: Secondary | ICD-10-CM | POA: Diagnosis not present

## 2019-11-11 DIAGNOSIS — N6452 Nipple discharge: Secondary | ICD-10-CM | POA: Diagnosis not present

## 2019-11-11 DIAGNOSIS — J302 Other seasonal allergic rhinitis: Secondary | ICD-10-CM | POA: Diagnosis not present

## 2019-11-22 DIAGNOSIS — M7062 Trochanteric bursitis, left hip: Secondary | ICD-10-CM | POA: Diagnosis not present

## 2019-11-22 DIAGNOSIS — M25552 Pain in left hip: Secondary | ICD-10-CM | POA: Diagnosis not present

## 2019-11-22 DIAGNOSIS — M5416 Radiculopathy, lumbar region: Secondary | ICD-10-CM | POA: Diagnosis not present

## 2019-11-25 DIAGNOSIS — G4733 Obstructive sleep apnea (adult) (pediatric): Secondary | ICD-10-CM | POA: Diagnosis not present

## 2019-11-29 DIAGNOSIS — Z6841 Body Mass Index (BMI) 40.0 and over, adult: Secondary | ICD-10-CM | POA: Diagnosis not present

## 2019-11-29 DIAGNOSIS — M5387 Other specified dorsopathies, lumbosacral region: Secondary | ICD-10-CM | POA: Diagnosis not present

## 2019-11-30 DIAGNOSIS — M48061 Spinal stenosis, lumbar region without neurogenic claudication: Secondary | ICD-10-CM | POA: Diagnosis not present

## 2019-11-30 DIAGNOSIS — M2578 Osteophyte, vertebrae: Secondary | ICD-10-CM | POA: Diagnosis not present

## 2019-11-30 DIAGNOSIS — M47814 Spondylosis without myelopathy or radiculopathy, thoracic region: Secondary | ICD-10-CM | POA: Diagnosis not present

## 2019-11-30 DIAGNOSIS — M47816 Spondylosis without myelopathy or radiculopathy, lumbar region: Secondary | ICD-10-CM | POA: Diagnosis not present

## 2019-11-30 DIAGNOSIS — M5126 Other intervertebral disc displacement, lumbar region: Secondary | ICD-10-CM | POA: Diagnosis not present

## 2019-11-30 DIAGNOSIS — M545 Low back pain: Secondary | ICD-10-CM | POA: Diagnosis not present

## 2019-11-30 DIAGNOSIS — M4807 Spinal stenosis, lumbosacral region: Secondary | ICD-10-CM | POA: Diagnosis not present

## 2019-11-30 DIAGNOSIS — M5124 Other intervertebral disc displacement, thoracic region: Secondary | ICD-10-CM | POA: Diagnosis not present

## 2019-12-09 DIAGNOSIS — M47816 Spondylosis without myelopathy or radiculopathy, lumbar region: Secondary | ICD-10-CM | POA: Diagnosis not present

## 2019-12-09 DIAGNOSIS — M5416 Radiculopathy, lumbar region: Secondary | ICD-10-CM | POA: Diagnosis not present

## 2019-12-09 DIAGNOSIS — M47815 Spondylosis without myelopathy or radiculopathy, thoracolumbar region: Secondary | ICD-10-CM | POA: Diagnosis not present

## 2019-12-09 DIAGNOSIS — M4316 Spondylolisthesis, lumbar region: Secondary | ICD-10-CM | POA: Diagnosis not present

## 2019-12-09 DIAGNOSIS — M438X6 Other specified deforming dorsopathies, lumbar region: Secondary | ICD-10-CM | POA: Diagnosis not present

## 2019-12-09 DIAGNOSIS — Z96643 Presence of artificial hip joint, bilateral: Secondary | ICD-10-CM | POA: Diagnosis not present

## 2019-12-09 DIAGNOSIS — M461 Sacroiliitis, not elsewhere classified: Secondary | ICD-10-CM | POA: Diagnosis not present

## 2019-12-09 HISTORY — DX: Spondylosis without myelopathy or radiculopathy, lumbar region: M47.816

## 2019-12-26 DIAGNOSIS — G4733 Obstructive sleep apnea (adult) (pediatric): Secondary | ICD-10-CM | POA: Diagnosis not present

## 2020-01-24 DIAGNOSIS — M47816 Spondylosis without myelopathy or radiculopathy, lumbar region: Secondary | ICD-10-CM | POA: Diagnosis not present

## 2020-01-24 DIAGNOSIS — M5416 Radiculopathy, lumbar region: Secondary | ICD-10-CM | POA: Diagnosis not present

## 2020-01-24 DIAGNOSIS — M5136 Other intervertebral disc degeneration, lumbar region: Secondary | ICD-10-CM | POA: Diagnosis not present

## 2020-02-01 DIAGNOSIS — M47816 Spondylosis without myelopathy or radiculopathy, lumbar region: Secondary | ICD-10-CM | POA: Diagnosis not present

## 2020-02-15 DIAGNOSIS — M47816 Spondylosis without myelopathy or radiculopathy, lumbar region: Secondary | ICD-10-CM | POA: Diagnosis not present

## 2020-02-23 DIAGNOSIS — G4733 Obstructive sleep apnea (adult) (pediatric): Secondary | ICD-10-CM | POA: Diagnosis not present

## 2020-02-24 DIAGNOSIS — R399 Unspecified symptoms and signs involving the genitourinary system: Secondary | ICD-10-CM | POA: Diagnosis not present

## 2020-02-24 DIAGNOSIS — B9620 Unspecified Escherichia coli [E. coli] as the cause of diseases classified elsewhere: Secondary | ICD-10-CM | POA: Diagnosis not present

## 2020-02-24 DIAGNOSIS — N390 Urinary tract infection, site not specified: Secondary | ICD-10-CM | POA: Diagnosis not present

## 2020-02-24 DIAGNOSIS — N3946 Mixed incontinence: Secondary | ICD-10-CM | POA: Diagnosis not present

## 2020-02-28 DIAGNOSIS — R5383 Other fatigue: Secondary | ICD-10-CM | POA: Diagnosis not present

## 2020-02-28 DIAGNOSIS — J301 Allergic rhinitis due to pollen: Secondary | ICD-10-CM | POA: Diagnosis not present

## 2020-02-28 DIAGNOSIS — G4733 Obstructive sleep apnea (adult) (pediatric): Secondary | ICD-10-CM | POA: Diagnosis not present

## 2020-02-28 DIAGNOSIS — J4540 Moderate persistent asthma, uncomplicated: Secondary | ICD-10-CM | POA: Diagnosis not present

## 2020-03-10 DIAGNOSIS — F4321 Adjustment disorder with depressed mood: Secondary | ICD-10-CM | POA: Diagnosis not present

## 2020-03-10 DIAGNOSIS — E669 Obesity, unspecified: Secondary | ICD-10-CM | POA: Diagnosis not present

## 2020-03-10 DIAGNOSIS — E039 Hypothyroidism, unspecified: Secondary | ICD-10-CM | POA: Diagnosis not present

## 2020-03-10 DIAGNOSIS — J4520 Mild intermittent asthma, uncomplicated: Secondary | ICD-10-CM | POA: Diagnosis not present

## 2020-03-10 DIAGNOSIS — I10 Essential (primary) hypertension: Secondary | ICD-10-CM | POA: Diagnosis not present

## 2020-03-10 DIAGNOSIS — K219 Gastro-esophageal reflux disease without esophagitis: Secondary | ICD-10-CM | POA: Diagnosis not present

## 2020-03-10 DIAGNOSIS — Z6841 Body Mass Index (BMI) 40.0 and over, adult: Secondary | ICD-10-CM | POA: Diagnosis not present

## 2020-03-10 DIAGNOSIS — J302 Other seasonal allergic rhinitis: Secondary | ICD-10-CM | POA: Diagnosis not present

## 2020-03-10 DIAGNOSIS — R600 Localized edema: Secondary | ICD-10-CM | POA: Diagnosis not present

## 2020-03-14 DIAGNOSIS — R399 Unspecified symptoms and signs involving the genitourinary system: Secondary | ICD-10-CM | POA: Diagnosis not present

## 2020-03-21 DIAGNOSIS — M47816 Spondylosis without myelopathy or radiculopathy, lumbar region: Secondary | ICD-10-CM | POA: Diagnosis not present

## 2020-03-24 DIAGNOSIS — G4733 Obstructive sleep apnea (adult) (pediatric): Secondary | ICD-10-CM | POA: Diagnosis not present

## 2020-03-24 DIAGNOSIS — Z6841 Body Mass Index (BMI) 40.0 and over, adult: Secondary | ICD-10-CM | POA: Diagnosis not present

## 2020-03-24 DIAGNOSIS — I10 Essential (primary) hypertension: Secondary | ICD-10-CM | POA: Diagnosis not present

## 2020-03-24 DIAGNOSIS — E669 Obesity, unspecified: Secondary | ICD-10-CM | POA: Diagnosis not present

## 2020-03-24 DIAGNOSIS — F4321 Adjustment disorder with depressed mood: Secondary | ICD-10-CM | POA: Diagnosis not present

## 2020-04-06 DIAGNOSIS — M47816 Spondylosis without myelopathy or radiculopathy, lumbar region: Secondary | ICD-10-CM | POA: Diagnosis not present

## 2020-04-14 DIAGNOSIS — I10 Essential (primary) hypertension: Secondary | ICD-10-CM | POA: Diagnosis not present

## 2020-04-14 DIAGNOSIS — K219 Gastro-esophageal reflux disease without esophagitis: Secondary | ICD-10-CM | POA: Diagnosis not present

## 2020-04-14 DIAGNOSIS — E039 Hypothyroidism, unspecified: Secondary | ICD-10-CM | POA: Diagnosis not present

## 2020-04-14 DIAGNOSIS — R2241 Localized swelling, mass and lump, right lower limb: Secondary | ICD-10-CM | POA: Diagnosis not present

## 2020-04-24 DIAGNOSIS — G4733 Obstructive sleep apnea (adult) (pediatric): Secondary | ICD-10-CM | POA: Diagnosis not present

## 2020-04-27 DIAGNOSIS — M25562 Pain in left knee: Secondary | ICD-10-CM | POA: Diagnosis not present

## 2020-04-27 DIAGNOSIS — M25561 Pain in right knee: Secondary | ICD-10-CM | POA: Diagnosis not present

## 2020-04-27 DIAGNOSIS — Z96652 Presence of left artificial knee joint: Secondary | ICD-10-CM | POA: Diagnosis not present

## 2020-04-27 DIAGNOSIS — Z471 Aftercare following joint replacement surgery: Secondary | ICD-10-CM | POA: Diagnosis not present

## 2020-04-27 DIAGNOSIS — Z96651 Presence of right artificial knee joint: Secondary | ICD-10-CM | POA: Diagnosis not present

## 2020-05-09 DIAGNOSIS — N3001 Acute cystitis with hematuria: Secondary | ICD-10-CM | POA: Diagnosis not present

## 2020-05-09 DIAGNOSIS — Z6841 Body Mass Index (BMI) 40.0 and over, adult: Secondary | ICD-10-CM | POA: Diagnosis not present

## 2020-05-09 DIAGNOSIS — F4321 Adjustment disorder with depressed mood: Secondary | ICD-10-CM | POA: Diagnosis not present

## 2020-05-24 DIAGNOSIS — G4733 Obstructive sleep apnea (adult) (pediatric): Secondary | ICD-10-CM | POA: Diagnosis not present

## 2020-05-31 DIAGNOSIS — G4733 Obstructive sleep apnea (adult) (pediatric): Secondary | ICD-10-CM | POA: Diagnosis not present

## 2020-05-31 DIAGNOSIS — R5383 Other fatigue: Secondary | ICD-10-CM | POA: Diagnosis not present

## 2020-05-31 DIAGNOSIS — J301 Allergic rhinitis due to pollen: Secondary | ICD-10-CM | POA: Diagnosis not present

## 2020-05-31 DIAGNOSIS — J4540 Moderate persistent asthma, uncomplicated: Secondary | ICD-10-CM | POA: Diagnosis not present

## 2020-06-20 DIAGNOSIS — M7989 Other specified soft tissue disorders: Secondary | ICD-10-CM | POA: Diagnosis not present

## 2020-06-20 DIAGNOSIS — Z96651 Presence of right artificial knee joint: Secondary | ICD-10-CM | POA: Diagnosis not present

## 2020-06-24 DIAGNOSIS — G4733 Obstructive sleep apnea (adult) (pediatric): Secondary | ICD-10-CM | POA: Diagnosis not present

## 2020-06-26 DIAGNOSIS — R399 Unspecified symptoms and signs involving the genitourinary system: Secondary | ICD-10-CM | POA: Diagnosis not present

## 2020-06-26 DIAGNOSIS — Z96651 Presence of right artificial knee joint: Secondary | ICD-10-CM | POA: Diagnosis not present

## 2020-06-26 DIAGNOSIS — N390 Urinary tract infection, site not specified: Secondary | ICD-10-CM | POA: Diagnosis not present

## 2020-06-26 DIAGNOSIS — R3989 Other symptoms and signs involving the genitourinary system: Secondary | ICD-10-CM | POA: Diagnosis not present

## 2020-06-26 DIAGNOSIS — Z87440 Personal history of urinary (tract) infections: Secondary | ICD-10-CM | POA: Diagnosis not present

## 2020-06-26 DIAGNOSIS — M7989 Other specified soft tissue disorders: Secondary | ICD-10-CM | POA: Diagnosis not present

## 2020-07-27 DIAGNOSIS — M7062 Trochanteric bursitis, left hip: Secondary | ICD-10-CM | POA: Diagnosis not present

## 2020-08-14 DIAGNOSIS — F4321 Adjustment disorder with depressed mood: Secondary | ICD-10-CM | POA: Diagnosis not present

## 2020-08-14 DIAGNOSIS — Z0000 Encounter for general adult medical examination without abnormal findings: Secondary | ICD-10-CM | POA: Diagnosis not present

## 2020-08-14 DIAGNOSIS — Z6841 Body Mass Index (BMI) 40.0 and over, adult: Secondary | ICD-10-CM | POA: Diagnosis not present

## 2020-08-14 DIAGNOSIS — Z131 Encounter for screening for diabetes mellitus: Secondary | ICD-10-CM | POA: Diagnosis not present

## 2020-08-14 DIAGNOSIS — E039 Hypothyroidism, unspecified: Secondary | ICD-10-CM | POA: Diagnosis not present

## 2020-08-14 DIAGNOSIS — I10 Essential (primary) hypertension: Secondary | ICD-10-CM | POA: Diagnosis not present

## 2020-08-14 DIAGNOSIS — K219 Gastro-esophageal reflux disease without esophagitis: Secondary | ICD-10-CM | POA: Diagnosis not present

## 2020-08-14 DIAGNOSIS — J302 Other seasonal allergic rhinitis: Secondary | ICD-10-CM | POA: Diagnosis not present

## 2020-08-14 DIAGNOSIS — Z1331 Encounter for screening for depression: Secondary | ICD-10-CM | POA: Diagnosis not present

## 2020-08-14 DIAGNOSIS — Z79899 Other long term (current) drug therapy: Secondary | ICD-10-CM | POA: Diagnosis not present

## 2020-08-14 DIAGNOSIS — M19011 Primary osteoarthritis, right shoulder: Secondary | ICD-10-CM | POA: Diagnosis not present

## 2020-09-11 DIAGNOSIS — Z23 Encounter for immunization: Secondary | ICD-10-CM | POA: Diagnosis not present

## 2020-09-11 DIAGNOSIS — M5387 Other specified dorsopathies, lumbosacral region: Secondary | ICD-10-CM | POA: Diagnosis not present

## 2020-09-14 DIAGNOSIS — Z9889 Other specified postprocedural states: Secondary | ICD-10-CM | POA: Diagnosis not present

## 2020-09-14 DIAGNOSIS — Z96642 Presence of left artificial hip joint: Secondary | ICD-10-CM | POA: Diagnosis not present

## 2020-09-14 DIAGNOSIS — M25552 Pain in left hip: Secondary | ICD-10-CM | POA: Diagnosis not present

## 2020-09-14 DIAGNOSIS — M47816 Spondylosis without myelopathy or radiculopathy, lumbar region: Secondary | ICD-10-CM | POA: Diagnosis not present

## 2020-09-14 DIAGNOSIS — M7062 Trochanteric bursitis, left hip: Secondary | ICD-10-CM | POA: Diagnosis not present

## 2020-09-14 DIAGNOSIS — M25551 Pain in right hip: Secondary | ICD-10-CM | POA: Diagnosis not present

## 2020-09-26 DIAGNOSIS — R399 Unspecified symptoms and signs involving the genitourinary system: Secondary | ICD-10-CM | POA: Diagnosis not present

## 2020-09-27 DIAGNOSIS — M47816 Spondylosis without myelopathy or radiculopathy, lumbar region: Secondary | ICD-10-CM | POA: Diagnosis not present

## 2020-09-27 DIAGNOSIS — M48061 Spinal stenosis, lumbar region without neurogenic claudication: Secondary | ICD-10-CM | POA: Diagnosis not present

## 2020-09-27 DIAGNOSIS — M797 Fibromyalgia: Secondary | ICD-10-CM | POA: Diagnosis not present

## 2020-09-27 DIAGNOSIS — M5416 Radiculopathy, lumbar region: Secondary | ICD-10-CM | POA: Diagnosis not present

## 2020-10-04 DIAGNOSIS — Z01818 Encounter for other preprocedural examination: Secondary | ICD-10-CM | POA: Diagnosis not present

## 2020-10-04 DIAGNOSIS — R399 Unspecified symptoms and signs involving the genitourinary system: Secondary | ICD-10-CM | POA: Diagnosis not present

## 2020-10-04 DIAGNOSIS — Z01810 Encounter for preprocedural cardiovascular examination: Secondary | ICD-10-CM | POA: Diagnosis not present

## 2020-10-05 DIAGNOSIS — N8110 Cystocele, unspecified: Secondary | ICD-10-CM | POA: Diagnosis not present

## 2020-10-05 DIAGNOSIS — N393 Stress incontinence (female) (male): Secondary | ICD-10-CM | POA: Diagnosis not present

## 2020-10-05 DIAGNOSIS — T8389XA Other specified complication of genitourinary prosthetic devices, implants and grafts, initial encounter: Secondary | ICD-10-CM | POA: Diagnosis not present

## 2020-10-05 DIAGNOSIS — T83711A Erosion of implanted vaginal mesh and other prosthetic materials to surrounding organ or tissue, initial encounter: Secondary | ICD-10-CM | POA: Diagnosis not present

## 2020-10-24 DIAGNOSIS — R399 Unspecified symptoms and signs involving the genitourinary system: Secondary | ICD-10-CM | POA: Diagnosis not present

## 2020-10-25 DIAGNOSIS — G8929 Other chronic pain: Secondary | ICD-10-CM | POA: Diagnosis not present

## 2020-10-25 DIAGNOSIS — M797 Fibromyalgia: Secondary | ICD-10-CM | POA: Diagnosis not present

## 2020-10-25 DIAGNOSIS — M47816 Spondylosis without myelopathy or radiculopathy, lumbar region: Secondary | ICD-10-CM | POA: Diagnosis not present

## 2020-10-25 DIAGNOSIS — M5416 Radiculopathy, lumbar region: Secondary | ICD-10-CM | POA: Diagnosis not present

## 2020-10-25 DIAGNOSIS — M48061 Spinal stenosis, lumbar region without neurogenic claudication: Secondary | ICD-10-CM | POA: Diagnosis not present

## 2020-10-25 DIAGNOSIS — M545 Low back pain, unspecified: Secondary | ICD-10-CM | POA: Diagnosis not present

## 2020-11-16 DIAGNOSIS — M533 Sacrococcygeal disorders, not elsewhere classified: Secondary | ICD-10-CM | POA: Diagnosis not present

## 2020-11-16 DIAGNOSIS — M545 Low back pain, unspecified: Secondary | ICD-10-CM | POA: Diagnosis not present

## 2020-11-16 DIAGNOSIS — G8929 Other chronic pain: Secondary | ICD-10-CM | POA: Diagnosis not present

## 2020-11-16 HISTORY — DX: Sacrococcygeal disorders, not elsewhere classified: M53.3

## 2020-11-28 DIAGNOSIS — I10 Essential (primary) hypertension: Secondary | ICD-10-CM | POA: Diagnosis not present

## 2020-11-28 DIAGNOSIS — K219 Gastro-esophageal reflux disease without esophagitis: Secondary | ICD-10-CM | POA: Diagnosis not present

## 2020-11-28 DIAGNOSIS — Z6841 Body Mass Index (BMI) 40.0 and over, adult: Secondary | ICD-10-CM | POA: Diagnosis not present

## 2020-11-28 DIAGNOSIS — F4321 Adjustment disorder with depressed mood: Secondary | ICD-10-CM | POA: Diagnosis not present

## 2020-11-28 DIAGNOSIS — J302 Other seasonal allergic rhinitis: Secondary | ICD-10-CM | POA: Diagnosis not present

## 2020-12-14 DIAGNOSIS — I10 Essential (primary) hypertension: Secondary | ICD-10-CM | POA: Diagnosis not present

## 2020-12-14 DIAGNOSIS — Z6841 Body Mass Index (BMI) 40.0 and over, adult: Secondary | ICD-10-CM | POA: Diagnosis not present

## 2020-12-22 DIAGNOSIS — Z79899 Other long term (current) drug therapy: Secondary | ICD-10-CM | POA: Diagnosis not present

## 2020-12-22 DIAGNOSIS — M47816 Spondylosis without myelopathy or radiculopathy, lumbar region: Secondary | ICD-10-CM | POA: Diagnosis not present

## 2020-12-22 DIAGNOSIS — M7918 Myalgia, other site: Secondary | ICD-10-CM | POA: Diagnosis not present

## 2020-12-22 DIAGNOSIS — G8929 Other chronic pain: Secondary | ICD-10-CM | POA: Diagnosis not present

## 2020-12-22 DIAGNOSIS — M5416 Radiculopathy, lumbar region: Secondary | ICD-10-CM | POA: Diagnosis not present

## 2020-12-22 DIAGNOSIS — M4726 Other spondylosis with radiculopathy, lumbar region: Secondary | ICD-10-CM | POA: Diagnosis not present

## 2020-12-22 DIAGNOSIS — M545 Low back pain, unspecified: Secondary | ICD-10-CM | POA: Diagnosis not present

## 2021-01-05 DIAGNOSIS — B372 Candidiasis of skin and nail: Secondary | ICD-10-CM | POA: Diagnosis not present

## 2021-01-05 DIAGNOSIS — L820 Inflamed seborrheic keratosis: Secondary | ICD-10-CM | POA: Diagnosis not present

## 2021-01-05 DIAGNOSIS — L304 Erythema intertrigo: Secondary | ICD-10-CM | POA: Diagnosis not present

## 2021-01-16 DIAGNOSIS — Z6841 Body Mass Index (BMI) 40.0 and over, adult: Secondary | ICD-10-CM | POA: Diagnosis not present

## 2021-01-16 DIAGNOSIS — E039 Hypothyroidism, unspecified: Secondary | ICD-10-CM | POA: Diagnosis not present

## 2021-01-16 DIAGNOSIS — I10 Essential (primary) hypertension: Secondary | ICD-10-CM | POA: Diagnosis not present

## 2021-01-17 DIAGNOSIS — M4726 Other spondylosis with radiculopathy, lumbar region: Secondary | ICD-10-CM | POA: Diagnosis not present

## 2021-01-17 DIAGNOSIS — M5416 Radiculopathy, lumbar region: Secondary | ICD-10-CM | POA: Diagnosis not present

## 2021-01-24 DIAGNOSIS — I10 Essential (primary) hypertension: Secondary | ICD-10-CM | POA: Diagnosis not present

## 2021-01-24 DIAGNOSIS — Z6841 Body Mass Index (BMI) 40.0 and over, adult: Secondary | ICD-10-CM | POA: Diagnosis not present

## 2021-01-26 DIAGNOSIS — R8290 Unspecified abnormal findings in urine: Secondary | ICD-10-CM | POA: Diagnosis not present

## 2021-01-26 DIAGNOSIS — N3941 Urge incontinence: Secondary | ICD-10-CM | POA: Diagnosis not present

## 2021-01-26 DIAGNOSIS — R399 Unspecified symptoms and signs involving the genitourinary system: Secondary | ICD-10-CM | POA: Diagnosis not present

## 2021-02-13 DIAGNOSIS — Z6841 Body Mass Index (BMI) 40.0 and over, adult: Secondary | ICD-10-CM | POA: Diagnosis not present

## 2021-02-13 DIAGNOSIS — R739 Hyperglycemia, unspecified: Secondary | ICD-10-CM | POA: Diagnosis not present

## 2021-02-13 DIAGNOSIS — E876 Hypokalemia: Secondary | ICD-10-CM | POA: Diagnosis not present

## 2021-02-16 DIAGNOSIS — M5412 Radiculopathy, cervical region: Secondary | ICD-10-CM | POA: Diagnosis not present

## 2021-02-16 DIAGNOSIS — M7918 Myalgia, other site: Secondary | ICD-10-CM | POA: Diagnosis not present

## 2021-02-16 DIAGNOSIS — M47816 Spondylosis without myelopathy or radiculopathy, lumbar region: Secondary | ICD-10-CM | POA: Diagnosis not present

## 2021-02-16 DIAGNOSIS — M25511 Pain in right shoulder: Secondary | ICD-10-CM | POA: Diagnosis not present

## 2021-02-16 DIAGNOSIS — G8929 Other chronic pain: Secondary | ICD-10-CM | POA: Diagnosis not present

## 2021-02-16 DIAGNOSIS — Z79899 Other long term (current) drug therapy: Secondary | ICD-10-CM | POA: Diagnosis not present

## 2021-03-12 DIAGNOSIS — Z981 Arthrodesis status: Secondary | ICD-10-CM | POA: Diagnosis not present

## 2021-03-12 DIAGNOSIS — M5412 Radiculopathy, cervical region: Secondary | ICD-10-CM | POA: Diagnosis not present

## 2021-03-12 DIAGNOSIS — M4722 Other spondylosis with radiculopathy, cervical region: Secondary | ICD-10-CM | POA: Diagnosis not present

## 2021-03-16 DIAGNOSIS — G4733 Obstructive sleep apnea (adult) (pediatric): Secondary | ICD-10-CM | POA: Diagnosis not present

## 2021-03-16 DIAGNOSIS — J301 Allergic rhinitis due to pollen: Secondary | ICD-10-CM | POA: Diagnosis not present

## 2021-03-16 DIAGNOSIS — J4540 Moderate persistent asthma, uncomplicated: Secondary | ICD-10-CM | POA: Diagnosis not present

## 2021-03-16 DIAGNOSIS — R5383 Other fatigue: Secondary | ICD-10-CM | POA: Diagnosis not present

## 2021-03-19 DIAGNOSIS — N3000 Acute cystitis without hematuria: Secondary | ICD-10-CM | POA: Diagnosis not present

## 2021-03-19 DIAGNOSIS — I10 Essential (primary) hypertension: Secondary | ICD-10-CM | POA: Diagnosis not present

## 2021-03-19 DIAGNOSIS — Z6841 Body Mass Index (BMI) 40.0 and over, adult: Secondary | ICD-10-CM | POA: Diagnosis not present

## 2021-03-20 DIAGNOSIS — G4733 Obstructive sleep apnea (adult) (pediatric): Secondary | ICD-10-CM | POA: Diagnosis not present

## 2021-04-17 DIAGNOSIS — M5412 Radiculopathy, cervical region: Secondary | ICD-10-CM | POA: Diagnosis not present

## 2021-04-17 DIAGNOSIS — Z4789 Encounter for other orthopedic aftercare: Secondary | ICD-10-CM | POA: Diagnosis not present

## 2021-04-17 DIAGNOSIS — M25511 Pain in right shoulder: Secondary | ICD-10-CM | POA: Diagnosis not present

## 2021-04-17 DIAGNOSIS — G8929 Other chronic pain: Secondary | ICD-10-CM | POA: Diagnosis not present

## 2021-04-17 DIAGNOSIS — Z79899 Other long term (current) drug therapy: Secondary | ICD-10-CM | POA: Diagnosis not present

## 2021-04-17 DIAGNOSIS — M7918 Myalgia, other site: Secondary | ICD-10-CM | POA: Diagnosis not present

## 2021-04-27 DIAGNOSIS — N3941 Urge incontinence: Secondary | ICD-10-CM | POA: Diagnosis not present

## 2021-04-27 DIAGNOSIS — R399 Unspecified symptoms and signs involving the genitourinary system: Secondary | ICD-10-CM | POA: Diagnosis not present

## 2021-04-27 DIAGNOSIS — R8290 Unspecified abnormal findings in urine: Secondary | ICD-10-CM | POA: Diagnosis not present

## 2021-04-27 DIAGNOSIS — R350 Frequency of micturition: Secondary | ICD-10-CM | POA: Diagnosis not present

## 2021-05-23 DIAGNOSIS — Z1231 Encounter for screening mammogram for malignant neoplasm of breast: Secondary | ICD-10-CM | POA: Diagnosis not present

## 2021-06-05 DIAGNOSIS — Z791 Long term (current) use of non-steroidal anti-inflammatories (NSAID): Secondary | ICD-10-CM | POA: Diagnosis not present

## 2021-06-05 DIAGNOSIS — Z888 Allergy status to other drugs, medicaments and biological substances status: Secondary | ICD-10-CM | POA: Diagnosis not present

## 2021-06-05 DIAGNOSIS — Z885 Allergy status to narcotic agent status: Secondary | ICD-10-CM | POA: Diagnosis not present

## 2021-06-05 DIAGNOSIS — M533 Sacrococcygeal disorders, not elsewhere classified: Secondary | ICD-10-CM | POA: Diagnosis not present

## 2021-06-21 DIAGNOSIS — M7918 Myalgia, other site: Secondary | ICD-10-CM | POA: Diagnosis not present

## 2021-06-21 DIAGNOSIS — M533 Sacrococcygeal disorders, not elsewhere classified: Secondary | ICD-10-CM | POA: Diagnosis not present

## 2021-07-12 DIAGNOSIS — N3091 Cystitis, unspecified with hematuria: Secondary | ICD-10-CM | POA: Diagnosis not present

## 2021-07-19 DIAGNOSIS — M545 Low back pain, unspecified: Secondary | ICD-10-CM | POA: Diagnosis not present

## 2021-07-19 DIAGNOSIS — Z79899 Other long term (current) drug therapy: Secondary | ICD-10-CM | POA: Diagnosis not present

## 2021-07-19 DIAGNOSIS — G8929 Other chronic pain: Secondary | ICD-10-CM | POA: Diagnosis not present

## 2021-07-19 DIAGNOSIS — M7918 Myalgia, other site: Secondary | ICD-10-CM | POA: Diagnosis not present

## 2021-08-30 DIAGNOSIS — M47816 Spondylosis without myelopathy or radiculopathy, lumbar region: Secondary | ICD-10-CM | POA: Diagnosis not present

## 2021-08-30 DIAGNOSIS — M5489 Other dorsalgia: Secondary | ICD-10-CM | POA: Diagnosis not present

## 2021-08-30 DIAGNOSIS — M5136 Other intervertebral disc degeneration, lumbar region: Secondary | ICD-10-CM | POA: Diagnosis not present

## 2021-08-30 DIAGNOSIS — M47815 Spondylosis without myelopathy or radiculopathy, thoracolumbar region: Secondary | ICD-10-CM | POA: Diagnosis not present

## 2021-08-30 DIAGNOSIS — M545 Low back pain, unspecified: Secondary | ICD-10-CM | POA: Diagnosis not present

## 2021-08-30 DIAGNOSIS — G8929 Other chronic pain: Secondary | ICD-10-CM | POA: Diagnosis not present

## 2021-08-31 DIAGNOSIS — Z23 Encounter for immunization: Secondary | ICD-10-CM | POA: Diagnosis not present

## 2021-08-31 DIAGNOSIS — Z6841 Body Mass Index (BMI) 40.0 and over, adult: Secondary | ICD-10-CM | POA: Diagnosis not present

## 2021-08-31 DIAGNOSIS — N3000 Acute cystitis without hematuria: Secondary | ICD-10-CM | POA: Diagnosis not present

## 2021-09-17 DIAGNOSIS — R5383 Other fatigue: Secondary | ICD-10-CM | POA: Diagnosis not present

## 2021-09-17 DIAGNOSIS — G4733 Obstructive sleep apnea (adult) (pediatric): Secondary | ICD-10-CM | POA: Diagnosis not present

## 2021-09-17 DIAGNOSIS — J4540 Moderate persistent asthma, uncomplicated: Secondary | ICD-10-CM | POA: Diagnosis not present

## 2021-09-17 DIAGNOSIS — J301 Allergic rhinitis due to pollen: Secondary | ICD-10-CM | POA: Diagnosis not present

## 2021-09-20 DIAGNOSIS — G4733 Obstructive sleep apnea (adult) (pediatric): Secondary | ICD-10-CM | POA: Diagnosis not present

## 2021-09-25 DIAGNOSIS — M2022 Hallux rigidus, left foot: Secondary | ICD-10-CM | POA: Diagnosis not present

## 2021-09-25 DIAGNOSIS — L608 Other nail disorders: Secondary | ICD-10-CM

## 2021-09-25 DIAGNOSIS — R234 Changes in skin texture: Secondary | ICD-10-CM | POA: Insufficient documentation

## 2021-09-25 HISTORY — DX: Other nail disorders: L60.8

## 2021-09-25 HISTORY — DX: Changes in skin texture: R23.4

## 2021-09-25 HISTORY — DX: Hallux rigidus, left foot: M20.22

## 2021-10-04 DIAGNOSIS — Q7649 Other congenital malformations of spine, not associated with scoliosis: Secondary | ICD-10-CM

## 2021-10-04 DIAGNOSIS — M47815 Spondylosis without myelopathy or radiculopathy, thoracolumbar region: Secondary | ICD-10-CM | POA: Diagnosis not present

## 2021-10-04 DIAGNOSIS — G8929 Other chronic pain: Secondary | ICD-10-CM | POA: Diagnosis not present

## 2021-10-04 DIAGNOSIS — M47816 Spondylosis without myelopathy or radiculopathy, lumbar region: Secondary | ICD-10-CM | POA: Diagnosis not present

## 2021-10-04 HISTORY — DX: Other congenital malformations of spine, not associated with scoliosis: Q76.49

## 2021-11-08 DIAGNOSIS — E039 Hypothyroidism, unspecified: Secondary | ICD-10-CM | POA: Diagnosis not present

## 2021-11-08 DIAGNOSIS — F339 Major depressive disorder, recurrent, unspecified: Secondary | ICD-10-CM | POA: Diagnosis not present

## 2021-11-08 DIAGNOSIS — Z9181 History of falling: Secondary | ICD-10-CM | POA: Diagnosis not present

## 2021-11-08 DIAGNOSIS — Z131 Encounter for screening for diabetes mellitus: Secondary | ICD-10-CM | POA: Diagnosis not present

## 2021-11-08 DIAGNOSIS — K219 Gastro-esophageal reflux disease without esophagitis: Secondary | ICD-10-CM | POA: Diagnosis not present

## 2021-11-08 DIAGNOSIS — I10 Essential (primary) hypertension: Secondary | ICD-10-CM | POA: Diagnosis not present

## 2021-11-08 DIAGNOSIS — Z0000 Encounter for general adult medical examination without abnormal findings: Secondary | ICD-10-CM | POA: Diagnosis not present

## 2021-11-08 DIAGNOSIS — Z13220 Encounter for screening for lipoid disorders: Secondary | ICD-10-CM | POA: Diagnosis not present

## 2021-11-08 DIAGNOSIS — Z6841 Body Mass Index (BMI) 40.0 and over, adult: Secondary | ICD-10-CM | POA: Diagnosis not present

## 2021-11-08 DIAGNOSIS — R6889 Other general symptoms and signs: Secondary | ICD-10-CM | POA: Diagnosis not present

## 2021-11-12 DIAGNOSIS — Q7649 Other congenital malformations of spine, not associated with scoliosis: Secondary | ICD-10-CM | POA: Diagnosis not present

## 2021-11-12 DIAGNOSIS — Z9889 Other specified postprocedural states: Secondary | ICD-10-CM | POA: Diagnosis not present

## 2021-11-12 DIAGNOSIS — G8929 Other chronic pain: Secondary | ICD-10-CM | POA: Diagnosis not present

## 2021-11-12 DIAGNOSIS — Z791 Long term (current) use of non-steroidal anti-inflammatories (NSAID): Secondary | ICD-10-CM | POA: Diagnosis not present

## 2021-11-12 DIAGNOSIS — Z79899 Other long term (current) drug therapy: Secondary | ICD-10-CM | POA: Diagnosis not present

## 2021-11-12 DIAGNOSIS — M5136 Other intervertebral disc degeneration, lumbar region: Secondary | ICD-10-CM | POA: Diagnosis not present

## 2021-11-12 DIAGNOSIS — M545 Low back pain, unspecified: Secondary | ICD-10-CM | POA: Diagnosis not present

## 2021-11-12 DIAGNOSIS — M47816 Spondylosis without myelopathy or radiculopathy, lumbar region: Secondary | ICD-10-CM | POA: Diagnosis not present

## 2021-11-12 DIAGNOSIS — M7918 Myalgia, other site: Secondary | ICD-10-CM | POA: Diagnosis not present

## 2021-12-12 DIAGNOSIS — E039 Hypothyroidism, unspecified: Secondary | ICD-10-CM | POA: Diagnosis not present

## 2021-12-12 DIAGNOSIS — F339 Major depressive disorder, recurrent, unspecified: Secondary | ICD-10-CM | POA: Diagnosis not present

## 2021-12-12 DIAGNOSIS — I10 Essential (primary) hypertension: Secondary | ICD-10-CM | POA: Diagnosis not present

## 2021-12-12 DIAGNOSIS — Z6841 Body Mass Index (BMI) 40.0 and over, adult: Secondary | ICD-10-CM | POA: Diagnosis not present

## 2022-01-15 DIAGNOSIS — M7918 Myalgia, other site: Secondary | ICD-10-CM | POA: Diagnosis not present

## 2022-01-15 DIAGNOSIS — Z79899 Other long term (current) drug therapy: Secondary | ICD-10-CM | POA: Diagnosis not present

## 2022-01-15 DIAGNOSIS — G8929 Other chronic pain: Secondary | ICD-10-CM | POA: Diagnosis not present

## 2022-01-15 DIAGNOSIS — M533 Sacrococcygeal disorders, not elsewhere classified: Secondary | ICD-10-CM | POA: Diagnosis not present

## 2022-02-08 DIAGNOSIS — N3000 Acute cystitis without hematuria: Secondary | ICD-10-CM | POA: Diagnosis not present

## 2022-02-08 DIAGNOSIS — Z6841 Body Mass Index (BMI) 40.0 and over, adult: Secondary | ICD-10-CM | POA: Diagnosis not present

## 2022-05-08 DIAGNOSIS — E039 Hypothyroidism, unspecified: Secondary | ICD-10-CM | POA: Diagnosis not present

## 2022-05-08 DIAGNOSIS — F339 Major depressive disorder, recurrent, unspecified: Secondary | ICD-10-CM | POA: Diagnosis not present

## 2022-05-08 DIAGNOSIS — I10 Essential (primary) hypertension: Secondary | ICD-10-CM | POA: Diagnosis not present

## 2022-05-08 DIAGNOSIS — Z6841 Body Mass Index (BMI) 40.0 and over, adult: Secondary | ICD-10-CM | POA: Diagnosis not present

## 2022-05-15 DIAGNOSIS — R739 Hyperglycemia, unspecified: Secondary | ICD-10-CM | POA: Diagnosis not present

## 2022-05-27 DIAGNOSIS — Z1231 Encounter for screening mammogram for malignant neoplasm of breast: Secondary | ICD-10-CM | POA: Diagnosis not present

## 2022-05-27 DIAGNOSIS — R928 Other abnormal and inconclusive findings on diagnostic imaging of breast: Secondary | ICD-10-CM | POA: Diagnosis not present

## 2022-05-30 DIAGNOSIS — K2100 Gastro-esophageal reflux disease with esophagitis, without bleeding: Secondary | ICD-10-CM | POA: Diagnosis not present

## 2022-05-30 DIAGNOSIS — R110 Nausea: Secondary | ICD-10-CM | POA: Diagnosis not present

## 2022-05-30 DIAGNOSIS — R1011 Right upper quadrant pain: Secondary | ICD-10-CM | POA: Diagnosis not present

## 2022-06-04 DIAGNOSIS — K7689 Other specified diseases of liver: Secondary | ICD-10-CM | POA: Diagnosis not present

## 2022-06-04 DIAGNOSIS — R1011 Right upper quadrant pain: Secondary | ICD-10-CM | POA: Diagnosis not present

## 2022-06-05 DIAGNOSIS — N6315 Unspecified lump in the right breast, overlapping quadrants: Secondary | ICD-10-CM | POA: Diagnosis not present

## 2022-06-05 DIAGNOSIS — R928 Other abnormal and inconclusive findings on diagnostic imaging of breast: Secondary | ICD-10-CM | POA: Diagnosis not present

## 2022-06-28 DIAGNOSIS — J4540 Moderate persistent asthma, uncomplicated: Secondary | ICD-10-CM | POA: Diagnosis not present

## 2022-06-28 DIAGNOSIS — G4733 Obstructive sleep apnea (adult) (pediatric): Secondary | ICD-10-CM | POA: Diagnosis not present

## 2022-06-28 DIAGNOSIS — R5383 Other fatigue: Secondary | ICD-10-CM | POA: Diagnosis not present

## 2022-06-28 DIAGNOSIS — J301 Allergic rhinitis due to pollen: Secondary | ICD-10-CM | POA: Diagnosis not present

## 2022-07-03 DIAGNOSIS — K219 Gastro-esophageal reflux disease without esophagitis: Secondary | ICD-10-CM | POA: Diagnosis not present

## 2022-07-03 DIAGNOSIS — Z6841 Body Mass Index (BMI) 40.0 and over, adult: Secondary | ICD-10-CM | POA: Diagnosis not present

## 2022-07-03 DIAGNOSIS — F339 Major depressive disorder, recurrent, unspecified: Secondary | ICD-10-CM | POA: Diagnosis not present

## 2022-07-03 DIAGNOSIS — I10 Essential (primary) hypertension: Secondary | ICD-10-CM | POA: Diagnosis not present

## 2022-07-10 DIAGNOSIS — G4733 Obstructive sleep apnea (adult) (pediatric): Secondary | ICD-10-CM | POA: Diagnosis not present

## 2022-07-12 ENCOUNTER — Other Ambulatory Visit: Payer: Self-pay | Admitting: *Deleted

## 2022-07-12 NOTE — Patient Outreach (Signed)
  Care Coordination   07/12/2022 Name: Peggy Jennings MRN: [id number] DOB: 1946-01-27   Care Coordination Outreach Attempts:  Contact was made with the patient today to offer care coordination services as a benefit of their health plan. Patient declined services. Follow Up Plan:  No further outreach attempts will be made at this time.   Encounter Outcome:  Pt. Refused  Care Coordination Interventions Activated:  No   Care Coordination Interventions:  No, not indicated    Emelia Loron RN, BSN [place] [phone] [email or web address]

## 2022-07-16 DIAGNOSIS — K2100 Gastro-esophageal reflux disease with esophagitis, without bleeding: Secondary | ICD-10-CM | POA: Diagnosis not present

## 2022-07-16 DIAGNOSIS — R1011 Right upper quadrant pain: Secondary | ICD-10-CM | POA: Diagnosis not present

## 2022-07-16 DIAGNOSIS — R110 Nausea: Secondary | ICD-10-CM | POA: Diagnosis not present

## 2022-08-14 DIAGNOSIS — F32A Depression, unspecified: Secondary | ICD-10-CM | POA: Insufficient documentation

## 2022-08-14 DIAGNOSIS — Z862 Personal history of diseases of the blood and blood-forming organs and certain disorders involving the immune mechanism: Secondary | ICD-10-CM | POA: Insufficient documentation

## 2022-08-14 DIAGNOSIS — I499 Cardiac arrhythmia, unspecified: Secondary | ICD-10-CM | POA: Insufficient documentation

## 2022-08-14 DIAGNOSIS — Z8709 Personal history of other diseases of the respiratory system: Secondary | ICD-10-CM | POA: Insufficient documentation

## 2022-08-14 DIAGNOSIS — Z8679 Personal history of other diseases of the circulatory system: Secondary | ICD-10-CM | POA: Insufficient documentation

## 2022-08-14 HISTORY — DX: Cardiac arrhythmia, unspecified: I49.9

## 2022-08-16 ENCOUNTER — Ambulatory Visit: Payer: PPO | Attending: Cardiology | Admitting: Cardiology

## 2022-08-16 ENCOUNTER — Encounter: Payer: Self-pay | Admitting: Cardiology

## 2022-08-16 VITALS — BP 162/88 | HR 71 | Ht 66.0 in | Wt 267.0 lb

## 2022-08-16 DIAGNOSIS — E782 Mixed hyperlipidemia: Secondary | ICD-10-CM

## 2022-08-16 DIAGNOSIS — I10 Essential (primary) hypertension: Secondary | ICD-10-CM

## 2022-08-16 DIAGNOSIS — I471 Supraventricular tachycardia: Secondary | ICD-10-CM

## 2022-08-16 DIAGNOSIS — R0602 Shortness of breath: Secondary | ICD-10-CM

## 2022-08-16 MED ORDER — SPIRONOLACTONE 25 MG PO TABS: 25.0000 mg | ORAL_TABLET | Freq: Every day | ORAL | 3 refills | Status: DC

## 2022-08-16 NOTE — Patient Instructions (Addendum)
[facility]  Tips to measure your blood pressure correctly  To determine whether you have hypertension, a medical professional will take a blood pressure reading. How you prepare for the test, the position of your arm, and other factors can change a blood pressure reading by 10% or more. That could be enough to hide high blood pressure, start you on a drug you don't really need, or lead your doctor to incorrectly adjust your medications. National and international guidelines offer specific instructions for measuring blood pressure. If a doctor, nurse, or medical assistant isn't doing it right, don't hesitate to ask him or her to get with the guidelines. Here's what you can do to ensure a correct reading:  Don't drink a caffeinated beverage or smoke during the 30 minutes before the test.  Sit quietly for five minutes before the test begins.  During the measurement, sit in a chair with your feet on the floor and your arm supported so your elbow is at about heart level.  The inflatable part of the cuff should completely cover at least 80% of your upper arm, and the cuff should be placed on bare skin, not over a shirt.  Don't talk during the measurement.  Have your blood pressure measured twice, with a brief break in between. If the readings are different by 5 points or more, have it done a third time. There are times to break these rules. If you sometimes feel lightheaded when getting out of bed in the morning or when you stand after sitting, you should have your blood pressure checked while seated and then while standing to see if it falls from one position to the next. Because blood pressure varies throughout the day, your doctor will rarely diagnose hypertension on the basis of a single reading. Instead, he or she will want to confirm the measurements on at least two occasions, usually within a few weeks of one another. The exception to this rule is if you have a blood pressure reading of 180/110 mm Hg  or higher. A result this high usually calls for prompt treatment. It's also a good idea to have your blood pressure measured in both arms at least once, since the reading in one arm (usually the right) may be higher than that in the left. A 2014 study in The American Journal of Medicine of nearly 3,400 people found average arm- to-arm differences in systolic blood pressure of about 5 points. The higher number should be used to make treatment decisions. In 2017, new guidelines from the [place], the [employer] of Cardiology, and nine other health organizations lowered the diagnosis of high blood pressure to 130/80 mm Hg or higher for all adults. The guidelines also redefined the various blood pressure categories to now include normal, elevated, Stage 1 hypertension, Stage 2 hypertension, and hypertensive crisis (see "Blood pressure categories"). Blood pressure categories  Blood pressure category SYSTOLIC (upper number)  DIASTOLIC (lower number)  Normal Less than 120 mm Hg and Less than 80 mm Hg  Elevated 120-129 mm Hg and Less than 80 mm Hg  High blood pressure: Stage 1 hypertension 130-139 mm Hg or 80-89 mm Hg  High blood pressure: Stage 2 hypertension 140 mm Hg or higher or 90 mm Hg or higher  Hypertensive crisis (consult your doctor immediately) Higher than 180 mm Hg and/or Higher than 120 mm Hg  Source: American Heart Association and American Stroke Association. For more on getting your blood pressure under control, buy Controlling Your Blood  Pressure, a Special Health Report from [facility], [place].   Medication Instructions:  Your physician has recommended you make the following change in your medication:   Start Spironolactone 25 mg daily. Keep a BP/HR log, check your BP 1-2 hours after your BP medication and again in the evening. Drop off your log when you come for labs in 2 weeks.  *If you need a refill on your cardiac medications before your next appointment,  please call your pharmacy*   Lab Work: Your physician recommends that you return for lab work in: 2 weeks You can come Monday through Friday 8:30 am to 12:00 pm and 1:15 to 4:30. You do not need to make an appointment as the order has already been placed. The labs you are going to have done are BMET and ProBNP.  If you have labs (blood work) drawn today and your tests are completely normal, you will receive your results only by: [place] (if you have MyChart) OR A paper copy in the mail If you have any lab test that is abnormal or we need to change your treatment, we will call you to review the results.   Testing/Procedures: None ordered   Follow-Up: At [facility], you and your health needs are our priority.  As part of our continuing mission to provide you with exceptional heart care, we have created designated Provider Care Teams.  These Care Teams include your primary Cardiologist (physician) and Advanced Practice Providers (APPs -  Physician Assistants and Nurse Practitioners) who all work together to provide you with the care you need, when you need it.  We recommend signing up for the patient portal called "MyChart".  Sign up information is provided on this After Visit Summary.  MyChart is used to connect with patients for Virtual Visits (Telemedicine).  Patients are able to view lab/test results, encounter notes, upcoming appointments, etc.  Non-urgent messages can be sent to your provider as well.   To learn more about what you can do with MyChart, go to [email or web address].    Your next appointment:   12 month(s)  The format for your next appointment:   In Person  Provider:   Shirlee More, MD   Other Instructions NA

## 2022-08-16 NOTE — Addendum Note (Signed)
Addended by: Truddie Hidden on: 08/16/2022 10:53 AM   Modules accepted: Orders

## 2022-08-16 NOTE — Progress Notes (Signed)
Cardiology Office Note:    Date:  08/16/2022   ID:  [name], DOB May 12, 1946, MRN [id number]  PCP:  Serita Grammes, MD  Cardiologist:  Shirlee More, MD    Referring MD: Serita Grammes, MD    ASSESSMENT:    1. Resistant hypertension   2. Paroxysmal SVT (supraventricular tachycardia) ([place])   3. Mixed hyperlipidemia    PLAN:    In order of problems listed above:  She clearly has resistant hypertension to achieve goal and then add a low-dose of an MRA and ask her 2 weeks to leave a list of blood pressure with good technique validated device and recheck her renal function.  It seems as if her CPAP is effective in sleep apnea has been addressed I think one of the barriers to care here is her morbid obesity and I am somewhat surprised that she was not approved to use semaglutide is profoundly effective I encouraged her to get back in touch with her primary care physician.  She will continue her antihypertensive agents loop diuretic furosemide essentially active clonidine and ARB 'Stable no recurrence of her SVT following EP catheter ablation Currently not on lipid-lowering treatment   Next appointment: 1 year   Medication Adjustments/Labs and Tests Ordered: Current medicines are reviewed at length with the patient today.  Concerns regarding medicines are outlined above.  No orders of the defined types were placed in this encounter.  No orders of the defined types were placed in this encounter.   Chief Complaint  Patient presents with   Follow-up    History of Present Illness:    Peggy Jennings is a 76 y.o. female with a hx of SVT with EP ablation hypertension obesity and obstructive sleep apnea with pseudo heart failure last seen 10/08/2019.  Compliance with diet, lifestyle and medications: Yes  She was seen by me in the office for SVT but it becomes very obvious that hypertension is her primary problem Since EP catheter ablation she has had no recurrence of  rapid heart rhythm She was prescribed semaglutide but never took it because she was told it would cost $1000 a month She is cared for by pulmonology on CPAP 4 hours at night and tells me her downloads show that its effective She is checking her blood pressure sporadically and not using good technique She had a little bit of salt to her diet She is an active She has ongoing edema related to her hypertension and sleep apnea Her home blood pressures typically run 140-150/90 at times numbers of up to 200/100 Past Medical History:  Diagnosis Date   Allergic rhinitis 02/21/2016   Arthritis 02/21/2016   Asthma    Asthma 02/21/2016   Blood transfusion    Cystocele 12/19/2015   Depression    Dysrhythmia    Hx of atrial fib. See progress note.    Dysuria 08/04/2017   Edema of both lower extremities 07/06/2018   Fibromyalgia    GERD (gastroesophageal reflux disease)    History of anemia    History of atrial fibrillation    ablation performed in 2007.   History of bronchitis    HTN (hypertension) 02/07/2012   Hyperlipemia    Hyperlipidemia 02/10/2012   Hypertension    Hypothyroid 02/21/2016   Intermittent constipation 02/21/2016   Knee pain 01/21/2013   Lesion of bladder 12/19/2015   Obesity 07/02/2016   Paroxysmal SVT (supraventricular tachycardia) ([place]) 08/21/2016   Periprosthetic fracture around internal prosthetic right knee joint 02/07/2012  Recurrent upper respiratory infection (URI)    Right knee pain 01/03/2014   Shortness of breath    SOB (shortness of breath) on exertion 07/06/2018   Status post revision of total knee replacement 02/07/2012   Trochanteric bursitis of left hip 06/11/2018   Urinary incontinence 12/19/2015   Urinary tract infection 2 months ago     Past Surgical History:  Procedure Laterality Date   [place] ELECTROPHYSIOLOGY STUDY AND ABLATION     ESOPHAGOGASTRODUODENOSCOPY     FINGER SURGERY      left index finger, right thumb, cyst removal   FOOT SURGERY Left    Heel spur   JOINT REPLACEMENT  2-3 years   Right Hip   JOINT REPLACEMENT  3-4 years ago   Left Hip   JOINT REPLACEMENT  4 years ago    Right Knee   JOINT REPLACEMENT  10 years ago    Left Knee   JOINT REPLACEMENT  01/20/2012   Right knee   NECK SURGERY     ORIF FEMUR FRACTURE  01/2012   ORIF FEMUR FRACTURE  02/06/2012   Procedure: OPEN REDUCTION INTERNAL FIXATION (ORIF) DISTAL FEMUR FRACTURE;  Surgeon: Rozanna Box, MD;  Location: [place];  Service: Orthopedics;  Laterality: Right;   PLANTAR FASCIA SURGERY  7-8 Years    Right Foot   TOTAL KNEE REVISION  01/20/2012   Procedure: TOTAL KNEE REVISION;  Surgeon: Rudean Haskell, MD;  Location: [place];  Service: Orthopedics;  Laterality: Right;    Current Medications: Current Meds  Medication Sig   albuterol (PROAIR HFA) 108 (90 Base) MCG/ACT inhaler Inhale 1 puff into the lungs every 6 (six) hours as needed.    CALCIUM PO Take 1 tablet by mouth daily.    Cholecalciferol (VITAMIN D3) 400 units CAPS Take 400 Units by mouth daily.   cloNIDine (CATAPRES) 0.1 MG tablet Take 0.1 mg 2 (two) times daily by mouth.    cyanocobalamin 100 MCG tablet Take 100 mcg by mouth daily.   furosemide (LASIX) 20 MG tablet Take 1 tablet by mouth daily.   levocetirizine (XYZAL) 5 MG tablet Take 5 mg by mouth daily.   levothyroxine (SYNTHROID, LEVOTHROID) 100 MCG tablet Take 100 mcg by mouth daily before breakfast.    montelukast (SINGULAIR) 10 MG tablet Take 10 mg by mouth at bedtime.   Multiple Vitamin (MULTI-VITAMINS) TABS Take 1 tablet by mouth daily.    MYRBETRIQ 50 MG TB24 tablet Take 50 mg by mouth daily.   pantoprazole (PROTONIX) 40 MG tablet Take 40 mg by mouth 2 (two) times daily.   telmisartan (MICARDIS) 80 MG tablet Take 80 mg by mouth daily.      Allergies:   Ace inhibitors, Atorvastatin, Oxycodone-acetaminophen, Percocet [oxycodone-acetaminophen], and Ramipril   Social  History   Socioeconomic History   Marital status: Married    Spouse name: Not on file   Number of children: Not on file   Years of education: Not on file   Highest education level: Not on file  Occupational History   Not on file  Tobacco Use   Smoking status: Never   Smokeless tobacco: Never  Vaping Use   Vaping Use: Never used  Substance and Sexual Activity   Alcohol use: No   Drug use: Never   Sexual activity: Not on file  Other Topics Concern   Not on file  Social History  Narrative   Not on file   Social Determinants of Health   Financial Resource Strain: Not on file  Food Insecurity: Not on file  Transportation Needs: Not on file  Physical Activity: Not on file  Stress: Not on file  Social Connections: Not on file     Family History: The patient's family history includes Hypertension in her daughter, father, and mother; Stroke in her father and mother. There is no history of Anesthesia problems, Hypotension, Malignant hyperthermia, or Pseudochol deficiency. ROS:   Please see the history of present illness.    All other systems reviewed and are negative.  EKGs/Labs/Other Studies Reviewed:    The following studies were reviewed today: Echocardiogram 06/21/2019:  1. The left ventricle has normal systolic function, with an ejection  fraction of 55-60%. The cavity size was normal. There is severe concentric  left ventricular hypertrophy. Left ventricular diastolic Doppler  parameters are consistent with impaired  relaxation. Elevated mean left atrial pressure No evidence of left  ventricular regional wall motion abnormalities.   2. The right ventricle has normal systolic function. The cavity was  normal. There is no increase in right ventricular wall thickness.   3. No evidence of mitral valve stenosis.   4. The aortic valve is tricuspid. Aortic valve regurgitation is mild to  moderate by color flow Doppler. No stenosis of the aortic valve.   5. The aorta is  normal in size and structure.   6. The aortic root, ascending aorta and aortic arch are normal in size  and structure.  EKG:  EKG ordered today and personally reviewed.  The ekg ordered today demonstrates sinus rhythm minor ST-T abnormality does not fit criteria for repolarization  Recent Labs: No results found for requested labs within last 365 days.  Recent Lipid Panel    Component Value Date/Time   CHOL 259 (H) 10/06/2017 1002   TRIG 232 (H) 10/06/2017 1002   HDL 51 10/06/2017 1002   CHOLHDL 5.1 (H) 10/06/2017 1002   LDLCALC 162 (H) 10/06/2017 1002    Physical Exam:    VS:  BP (!) 160/120 (BP Location: Right Arm, Patient Position: Sitting, Cuff Size: Normal)   Pulse 71   Ht '5\' 6"'$  (1.676 m)   Wt 267 lb (121.1 kg)   SpO2 96%   BMI 43.09 kg/m     Wt Readings from Last 3 Encounters:  08/16/22 267 lb (121.1 kg)  10/08/19 268 lb 6.4 oz (121.7 kg)  06/02/19 268 lb (121.6 kg)     GEN: Marked obesity well nourished, well developed in no acute distress HEENT: Normal NECK: No JVD; No carotid bruits LYMPHATICS: No lymphadenopathy CARDIAC: RRR, no murmurs, rubs, gallops RESPIRATORY:  Clear to auscultation without rales, wheezing or rhonchi  ABDOMEN: Soft, non-tender, non-distended MUSCULOSKELETAL:  No edema; No deformity  SKIN: Warm and dry NEUROLOGIC:  Alert and oriented x 3 PSYCHIATRIC:  Normal affect    Signed, Shirlee More, MD  08/16/2022 10:39 AM    [place]

## 2022-08-26 DIAGNOSIS — D485 Neoplasm of uncertain behavior of skin: Secondary | ICD-10-CM | POA: Diagnosis not present

## 2022-08-26 DIAGNOSIS — L814 Other melanin hyperpigmentation: Secondary | ICD-10-CM | POA: Diagnosis not present

## 2022-08-26 DIAGNOSIS — D225 Melanocytic nevi of trunk: Secondary | ICD-10-CM | POA: Diagnosis not present

## 2022-08-26 DIAGNOSIS — L821 Other seborrheic keratosis: Secondary | ICD-10-CM | POA: Diagnosis not present

## 2022-09-02 DIAGNOSIS — R0602 Shortness of breath: Secondary | ICD-10-CM | POA: Diagnosis not present

## 2022-09-02 DIAGNOSIS — I471 Supraventricular tachycardia, unspecified: Secondary | ICD-10-CM | POA: Diagnosis not present

## 2022-09-03 LAB — BASIC METABOLIC PANEL
BUN/Creatinine Ratio: 17 (ref 12–28)
BUN: 15 mg/dL (ref 8–27)
CO2: 26 mmol/L (ref 20–29)
Calcium: 9.3 mg/dL (ref 8.7–10.3)
Chloride: 102 mmol/L (ref 96–106)
Creatinine, Ser: 0.87 mg/dL (ref 0.57–1.00)
Glucose: 134 mg/dL — ABNORMAL HIGH (ref 70–99)
Potassium: 4.5 mmol/L (ref 3.5–5.2)
Sodium: 139 mmol/L (ref 134–144)
eGFR: 69 mL/min/{1.73_m2} (ref >59)

## 2022-09-03 LAB — PRO B NATRIURETIC PEPTIDE: NT-Pro BNP: 174 pg/mL (ref 0–738)

## 2022-09-04 MED ORDER — HYDRALAZINE HCL 25 MG PO TABS: ORAL_TABLET | ORAL | 3 refills | Status: DC

## 2022-09-04 NOTE — Addendum Note (Signed)
Addended by: Truddie Hidden on: 09/04/2022 07:15 PM   Modules accepted: Orders

## 2022-09-09 DIAGNOSIS — R8290 Unspecified abnormal findings in urine: Secondary | ICD-10-CM | POA: Diagnosis not present

## 2022-09-09 DIAGNOSIS — R399 Unspecified symptoms and signs involving the genitourinary system: Secondary | ICD-10-CM | POA: Diagnosis not present

## 2022-09-18 DIAGNOSIS — Z23 Encounter for immunization: Secondary | ICD-10-CM | POA: Diagnosis not present

## 2022-09-23 DIAGNOSIS — Z23 Encounter for immunization: Secondary | ICD-10-CM | POA: Diagnosis not present

## 2022-11-11 DIAGNOSIS — Z1331 Encounter for screening for depression: Secondary | ICD-10-CM | POA: Diagnosis not present

## 2022-11-11 DIAGNOSIS — Z6841 Body Mass Index (BMI) 40.0 and over, adult: Secondary | ICD-10-CM | POA: Diagnosis not present

## 2022-11-11 DIAGNOSIS — I10 Essential (primary) hypertension: Secondary | ICD-10-CM | POA: Diagnosis not present

## 2022-11-11 DIAGNOSIS — Z13220 Encounter for screening for lipoid disorders: Secondary | ICD-10-CM | POA: Diagnosis not present

## 2022-11-11 DIAGNOSIS — R7302 Impaired glucose tolerance (oral): Secondary | ICD-10-CM | POA: Diagnosis not present

## 2022-11-11 DIAGNOSIS — F339 Major depressive disorder, recurrent, unspecified: Secondary | ICD-10-CM | POA: Diagnosis not present

## 2022-11-11 DIAGNOSIS — Z79899 Other long term (current) drug therapy: Secondary | ICD-10-CM | POA: Diagnosis not present

## 2022-11-11 DIAGNOSIS — Z0000 Encounter for general adult medical examination without abnormal findings: Secondary | ICD-10-CM | POA: Diagnosis not present

## 2022-11-11 DIAGNOSIS — E039 Hypothyroidism, unspecified: Secondary | ICD-10-CM | POA: Diagnosis not present

## 2022-11-27 DIAGNOSIS — R030 Elevated blood-pressure reading, without diagnosis of hypertension: Secondary | ICD-10-CM | POA: Diagnosis not present

## 2022-11-27 DIAGNOSIS — N390 Urinary tract infection, site not specified: Secondary | ICD-10-CM | POA: Diagnosis not present

## 2022-11-27 DIAGNOSIS — R82998 Other abnormal findings in urine: Secondary | ICD-10-CM | POA: Diagnosis not present

## 2022-11-27 DIAGNOSIS — R399 Unspecified symptoms and signs involving the genitourinary system: Secondary | ICD-10-CM | POA: Diagnosis not present

## 2022-12-04 DIAGNOSIS — N390 Urinary tract infection, site not specified: Secondary | ICD-10-CM | POA: Insufficient documentation

## 2022-12-04 DIAGNOSIS — J069 Acute upper respiratory infection, unspecified: Secondary | ICD-10-CM | POA: Insufficient documentation

## 2022-12-04 NOTE — Progress Notes (Unsigned)
Cardiology Office Note:    Date:  12/05/2022   ID:  Peggy Jennings, DOB 04/23/1946, MRN [id number]  PCP:  Serita Grammes, MD  Cardiologist:  Shirlee More, MD    Referring MD: Serita Grammes, MD    ASSESSMENT:    1. Resistant hypertension   2. Paroxysmal SVT (supraventricular tachycardia)   3. Mixed hyperlipidemia    PLAN:    In order of problems listed above:  Plan new blood pressure cuff reinforced good technique compliance and diastolic cavity 2 weeks a day to make a decision whether or not to refer for renal denervation Stable SVT no clinical recurrence Currently not on lipid-lowering therapy   Next appointment: 31-monthfollow-up   Medication Adjustments/Labs and Tests Ordered: Current medicines are reviewed at length with the patient today.  Concerns regarding medicines are outlined above.  Orders Placed This Encounter  Procedures   Basic Metabolic Panel (BMET)   Meds ordered this encounter  Medications   nebivolol (BYSTOLIC) 5 MG tablet    Sig: Take 1 tablet (5 mg total) by mouth daily.    Dispense:  90 tablet    Refill:  3    Chief Complaint  Patient presents with   Follow-up   Hypertension    History of Present Illness:    Peggy KANADYis a 77y.o. female with a hx of resistant hypertension morbid obesity sleep apnea paroxysmal SVT and hyperlipidemia last seen 08/16/2022.  Her blood pressure and poorly controlled on a regimen including loop diuretic spironolactone ARB hydralazine and centrally active clonidine.  Compliance with diet, lifestyle and medications: Yes  Is a complex visit according to patient and her husband are compliant with her medications she is sodium restriction uses CPAP 6 to 7 hours per night and [facility] consistently running above range).  When she gets is 180/90. Despite that she comes to my office and her blood pressure is 151/82 She cannot take hydralazine  twice a day she said she just feels badly  she takes her blood pressure at sporadic times during the day and we gave her education about good technique she will get a new device large cuff, send a list in 2 weeks if she remains uncontrolled I will recheck to see if [facility] is doing renal denervation for this subset of patients. She has chronic edema we cannot use calcium channel blocker she cannot tolerate higher dose of clonidine with dry mouth and unfortunately she is a woman and we cannot use minoxidil. She is not having shortness of breath chest pain syncope  Past Medical History:  Diagnosis Date   Allergic rhinitis 02/21/2016   Arthritis 02/21/2016   Asthma    Asthma 02/21/2016   Blood transfusion    Cystocele 12/19/2015   Depression    Dysrhythmia    Hx of atrial fib. See progress note.    Dysuria 08/04/2017   Edema of both lower extremities 07/06/2018   Fibromyalgia    GERD (gastroesophageal reflux disease)    History of anemia    History of atrial fibrillation    ablation performed in 2007.   History of bronchitis    HTN (hypertension) 02/07/2012   Hyperlipemia    Hyperlipidemia 02/10/2012   Hypertension    Hypothyroid 02/21/2016   Intermittent constipation 02/21/2016   Knee pain 01/21/2013   Lesion of bladder 12/19/2015   Obesity 07/02/2016   Paroxysmal SVT (supraventricular tachycardia) 08/21/2016   Periprosthetic fracture  around internal prosthetic right knee joint 02/07/2012   Recurrent upper respiratory infection (URI)    Right knee pain 01/03/2014   Shortness of breath    SOB (shortness of breath) on exertion 07/06/2018   Status post revision of total knee replacement 02/07/2012   Trochanteric bursitis of left hip 06/11/2018   Urinary incontinence 12/19/2015   Urinary tract infection 2 months ago     Past Surgical History:  Procedure Laterality Date   [place] ELECTROPHYSIOLOGY STUDY AND ABLATION      ESOPHAGOGASTRODUODENOSCOPY     FINGER SURGERY     left index finger, right thumb, cyst removal   FOOT SURGERY Left    Heel spur   JOINT REPLACEMENT  2-3 years   Right Hip   JOINT REPLACEMENT  3-4 years ago   Left Hip   JOINT REPLACEMENT  4 years ago    Right Knee   JOINT REPLACEMENT  10 years ago    Left Knee   JOINT REPLACEMENT  01/20/2012   Right knee   NECK SURGERY     ORIF FEMUR FRACTURE  01/2012   ORIF FEMUR FRACTURE  02/06/2012   Procedure: OPEN REDUCTION INTERNAL FIXATION (ORIF) DISTAL FEMUR FRACTURE;  Surgeon: Rozanna Box, MD;  Location: [place];  Service: Orthopedics;  Laterality: Right;   PLANTAR FASCIA SURGERY  7-8 Years    Right Foot   TOTAL KNEE REVISION  01/20/2012   Procedure: TOTAL KNEE REVISION;  Surgeon: Rudean Haskell, MD;  Location: [place];  Service: Orthopedics;  Laterality: Right;    Current Medications: Current Meds  Medication Sig   albuterol (PROAIR HFA) 108 (90 Base) MCG/ACT inhaler Inhale 1 puff into the lungs every 6 (six) hours as needed.    buPROPion (WELLBUTRIN XL) 300 MG 24 hr tablet Take 300 mg by mouth daily.   CALCIUM PO Take 1 tablet by mouth daily.    Cholecalciferol (VITAMIN D3) 400 units CAPS Take 400 Units by mouth daily.   cloNIDine (CATAPRES) 0.1 MG tablet Take 0.1 mg 2 (two) times daily by mouth.    cyanocobalamin 100 MCG tablet Take 100 mcg by mouth daily.   furosemide (LASIX) 20 MG tablet Take 1 tablet by mouth 2 (two) times daily.   hydrALAZINE (APRESOLINE) 25 MG tablet Take 12.5 mg (1/2 tablet) twice daily wait 1 week and if your average remains greater than 140 increase to full tablet 25 mg twice daily. (Patient taking differently: Take 25 mg by mouth at bedtime.)   levocetirizine (XYZAL) 5 MG tablet Take 5 mg by mouth daily.   levothyroxine (SYNTHROID) 112 MCG tablet Take 112 mcg by mouth daily before breakfast.   meloxicam (MOBIC) 15 MG tablet Take 15 mg by mouth daily.   montelukast (SINGULAIR) 10 MG tablet Take 10 mg by mouth  at bedtime.   Multiple Vitamin (MULTI-VITAMINS) TABS Take 1 tablet by mouth daily.    MYRBETRIQ 50 MG TB24 tablet Take 50 mg by mouth daily.   nebivolol (BYSTOLIC) 5 MG tablet Take 1 tablet (5 mg total) by mouth daily.   nitrofurantoin (MACRODANTIN) 25 MG capsule Take 25 mg by mouth at bedtime.   pantoprazole (PROTONIX) 40 MG tablet Take 40 mg by mouth 2 (two) times daily.   telmisartan (MICARDIS) 80 MG tablet Take 80 mg by mouth daily.      Allergies:   Ace inhibitors, Atorvastatin, Oxycodone-acetaminophen, Percocet [  oxycodone-acetaminophen], and Ramipril   Social History   Socioeconomic History   Marital status: Married    Spouse name: Not on file   Number of children: Not on file   Years of education: Not on file   Highest education level: Not on file  Occupational History   Not on file  Tobacco Use   Smoking status: Never   Smokeless tobacco: Never  Vaping Use   Vaping Use: Never used  Substance and Sexual Activity   Alcohol use: No   Drug use: Never   Sexual activity: Not on file  Other Topics Concern   Not on file  Social History Narrative   Not on file   Social Determinants of Health   Financial Resource Strain: Not on file  Food Insecurity: Not on file  Transportation Needs: Not on file  Physical Activity: Not on file  Stress: Not on file  Social Connections: Not on file     Family History: The patient's family history includes Hypertension in her daughter, father, and mother; Stroke in her father and mother. There is no history of Anesthesia problems, Hypotension, Malignant hyperthermia, or Pseudochol deficiency. ROS:   Please see the history of present illness.    All other systems reviewed and are negative.  EKGs/Labs/Other Studies Reviewed:    The following studies were reviewed today:    Recent Labs: 09/02/2022: BUN 15; Creatinine, Ser 0.87; NT-Pro BNP 174; Potassium 4.5; Sodium 139  Recent Lipid Panel    Component Value Date/Time   CHOL 259  (H) 10/06/2017 1002   TRIG 232 (H) 10/06/2017 1002   HDL 51 10/06/2017 1002   CHOLHDL 5.1 (H) 10/06/2017 1002   LDLCALC 162 (H) 10/06/2017 1002    Physical Exam:    VS:  BP (!) 151/82 (BP Location: Left Wrist, Patient Position: Sitting)   Pulse 68   Ht '5\' 6"'$  (1.676 m)   Wt 263 lb (119.3 kg)   SpO2 96%   BMI 42.45 kg/m     Wt Readings from Last 3 Encounters:  12/05/22 263 lb (119.3 kg)  08/16/22 267 lb (121.1 kg)  10/08/19 268 lb 6.4 oz (121.7 kg)     GEN:  Well nourished, well developed in no acute distress HEENT: Normal NECK: No JVD; No carotid bruits LYMPHATICS: No lymphadenopathy CARDIAC: RRR, no murmurs, rubs, gallops RESPIRATORY:  Clear to auscultation without rales, wheezing or rhonchi  ABDOMEN: Soft, non-tender, non-distended MUSCULOSKELETAL:  No edema; No deformity  SKIN: Warm and dry NEUROLOGIC:  Alert and oriented x 3 PSYCHIATRIC:  Normal affect    Signed, Shirlee More, MD  12/05/2022 1:06 PM    [facility]

## 2022-12-05 ENCOUNTER — Ambulatory Visit: Payer: PPO | Attending: Cardiology | Admitting: Cardiology

## 2022-12-05 ENCOUNTER — Encounter: Payer: Self-pay | Admitting: Cardiology

## 2022-12-05 VITALS — BP 151/82 | HR 68 | Ht 66.0 in | Wt 263.0 lb

## 2022-12-05 DIAGNOSIS — I1A0 Resistant hypertension: Secondary | ICD-10-CM | POA: Diagnosis not present

## 2022-12-05 DIAGNOSIS — E782 Mixed hyperlipidemia: Secondary | ICD-10-CM

## 2022-12-05 DIAGNOSIS — I471 Supraventricular tachycardia, unspecified: Secondary | ICD-10-CM | POA: Diagnosis not present

## 2022-12-05 MED ORDER — NEBIVOLOL HCL 5 MG PO TABS: 5.0000 mg | ORAL_TABLET | Freq: Every day | ORAL | 3 refills | Status: DC

## 2022-12-05 NOTE — Patient Instructions (Addendum)
Medication Instructions:  Your physician has recommended you make the following change in your medication:   START: Bystolic 5 mg daily  *If you need a refill on your cardiac medications before your next appointment, please call your pharmacy*   Lab Work: Your physician recommends that you return for lab work in:   Labs today: BMP  If you have labs (blood work) drawn today and your tests are completely normal, you will receive your results only by: [place] (if you have [place]) OR A paper copy in the mail If you have any lab test that is abnormal or we need to change your treatment, we will call you to review the results.   Testing/Procedures: None   Follow-Up: At [facility], you and your health needs are our priority.  As part of our continuing mission to provide you with exceptional heart care, we have created designated Provider Care Teams.  These Care Teams include your primary Cardiologist (physician) and Advanced Practice Providers (APPs -  Physician Assistants and Nurse Practitioners) who all work together to provide you with the care you need, when you need it.  We recommend signing up for the patient portal called "MyChart".  Sign up information is provided on this After Visit Summary.  MyChart is used to connect with patients for Virtual Visits (Telemedicine).  Patients are able to view lab/test results, encounter notes, upcoming appointments, etc.  Non-urgent messages can be sent to your provider as well.   To learn more about what you can do with MyChart, go to [email or web address].    Your next appointment:   3 month(s)  Provider:   Shirlee More, MD    Other Instructions Purchase an Omron blood pressure device with a large cuff and check blood pressure daily.  Send a list of blood pressure's to my office in 2 weeks.     [facility]  Tips to measure your blood pressure correctly  To determine whether you have hypertension, a medical  professional will take a blood pressure reading. How you prepare for the test, the position of your arm, and other factors can change a blood pressure reading by 10% or more. That could be enough to hide high blood pressure, start you on a drug you don't really need, or lead your doctor to incorrectly adjust your medications. National and international guidelines offer specific instructions for measuring blood pressure. If a doctor, nurse, or medical assistant isn't doing it right, don't hesitate to ask him or her to get with the guidelines. Here's what you can do to ensure a correct reading:  Don't drink a caffeinated beverage or smoke during the 30 minutes before the test.  Sit quietly for five minutes before the test begins.  During the measurement, sit in a chair with your feet on the floor and your arm supported so your elbow is at about heart level.  The inflatable part of the cuff should completely cover at least 80% of your upper arm, and the cuff should be placed on bare skin, not over a shirt.  Don't talk during the measurement.  Have your blood pressure measured twice, with a brief break in between. If the readings are different by 5 points or more, have it done a third time. There are times to break these rules. If you sometimes feel lightheaded when getting out of bed in the morning or when you stand after sitting, you should have your blood pressure checked while seated and then while standing to see  if it falls from one position to the next. Because blood pressure varies throughout the day, your doctor will rarely diagnose hypertension on the basis of a single reading. Instead, he or she will want to confirm the measurements on at least two occasions, usually within a few weeks of one another. The exception to this rule is if you have a blood pressure reading of 180/110 mm Hg or higher. A result this high usually calls for prompt treatment. It's also a good idea to have your blood pressure  measured in both arms at least once, since the reading in one arm (usually the right) may be higher than that in the left. A 2014 study in The American Journal of Medicine of nearly 3,400 people found average arm- to-arm differences in systolic blood pressure of about 5 points. The higher number should be used to make treatment decisions. In 2017, new guidelines from the [place], the [employer] of Cardiology, and nine other health organizations lowered the diagnosis of high blood pressure to 130/80 mm Hg or higher for all adults. The guidelines also redefined the various blood pressure categories to now include normal, elevated, Stage 1 hypertension, Stage 2 hypertension, and hypertensive crisis (see "Blood pressure categories"). Blood pressure categories  Blood pressure category SYSTOLIC (upper number)  DIASTOLIC (lower number)  Normal Less than 120 mm Hg and Less than 80 mm Hg  Elevated 120-129 mm Hg and Less than 80 mm Hg  High blood pressure: Stage 1 hypertension 130-139 mm Hg or 80-89 mm Hg  High blood pressure: Stage 2 hypertension 140 mm Hg or higher or 90 mm Hg or higher  Hypertensive crisis (consult your doctor immediately) Higher than 180 mm Hg and/or Higher than 120 mm Hg  Source: American Heart Association and American Stroke Association. For more on getting your blood pressure under control, buy Controlling Your Blood Pressure, a Special Health Report from [facility].

## 2022-12-06 ENCOUNTER — Telehealth: Payer: Self-pay

## 2022-12-06 LAB — BASIC METABOLIC PANEL
BUN/Creatinine Ratio: 20 (ref 12–28)
BUN: 18 mg/dL (ref 8–27)
CO2: 24 mmol/L (ref 20–29)
Calcium: 9.3 mg/dL (ref 8.7–10.3)
Chloride: 103 mmol/L (ref 96–106)
Creatinine, Ser: 0.91 mg/dL (ref 0.57–1.00)
Glucose: 98 mg/dL (ref 70–99)
Potassium: 4.8 mmol/L (ref 3.5–5.2)
Sodium: 142 mmol/L (ref 134–144)
eGFR: 65 mL/min/{1.73_m2} (ref >59)

## 2022-12-06 NOTE — Telephone Encounter (Signed)
-----  Message from Richardo Priest, MD sent at 12/06/2022 12:04 PM EST ----- Normal or stable result

## 2022-12-06 NOTE — Telephone Encounter (Signed)
Informed patient of recent labs per Dr Bettina Gavia results are normal or stable.

## 2022-12-23 ENCOUNTER — Other Ambulatory Visit: Payer: Self-pay

## 2022-12-23 ENCOUNTER — Ambulatory Visit: Payer: PPO | Attending: Cardiology

## 2022-12-23 ENCOUNTER — Telehealth: Payer: Self-pay | Admitting: Cardiology

## 2022-12-23 ENCOUNTER — Telehealth: Payer: Self-pay

## 2022-12-23 DIAGNOSIS — R0602 Shortness of breath: Secondary | ICD-10-CM

## 2022-12-23 LAB — ECHOCARDIOGRAM COMPLETE
Area-P 1/2: 3.77 cm2
P 1/2 time: 599 msec
S' Lateral: 3.4 cm

## 2022-12-23 NOTE — Telephone Encounter (Signed)
Called patient and she reported that she has become progressively more short of breath with any exertion. She also reports some lightheadedness and weakness with the shortness of breath. Her blood pressure today was 149/76 with a heart rate of 67.

## 2022-12-23 NOTE — Telephone Encounter (Signed)
Left message for the patient to call back.  

## 2022-12-23 NOTE — Telephone Encounter (Signed)
Called patient and informed him of Dr. Joya Gaskins recommendation below:  "First issue taking her diuretic?  If no take her furosemide.  Yes needs a proBNP level BMP and echocardiogram performed"  Patient stated that she was taking her lasix. Orders for labwork were entered into Epic and an echocardiogram was ordered. Patient was agreeable with this plan and had no further questions at this time.

## 2022-12-23 NOTE — Telephone Encounter (Signed)
Pt c/o Shortness Of Breath: STAT if SOB developed within the last 24 hours or pt is noticeably SOB on the phone  1. Are you currently SOB (can you hear that pt is SOB on the phone)?   No  2. How long have you been experiencing SOB?   Several months but getting worse  3. Are you SOB when sitting or when up moving around?   When up and moving around   4. Are you currently experiencing any other symptoms?   Some lightheadedness   Husband stated patient has been weak and having SOB.

## 2022-12-24 ENCOUNTER — Telehealth: Payer: Self-pay

## 2022-12-24 NOTE — Telephone Encounter (Signed)
-----  Message from Richardo Priest, MD sent at 12/24/2022  7:45 AM EST ----- Normal or stable result  Good result normal heart muscle function.

## 2022-12-24 NOTE — Telephone Encounter (Signed)
Patient notified of results.

## 2022-12-25 ENCOUNTER — Telehealth: Payer: Self-pay

## 2022-12-25 LAB — BASIC METABOLIC PANEL
BUN/Creatinine Ratio: 15 (ref 12–28)
BUN: 15 mg/dL (ref 8–27)
CO2: 24 mmol/L (ref 20–29)
Calcium: 9.3 mg/dL (ref 8.7–10.3)
Chloride: 105 mmol/L (ref 96–106)
Creatinine, Ser: 1 mg/dL (ref 0.57–1.00)
Glucose: 90 mg/dL (ref 70–99)
Potassium: 4.8 mmol/L (ref 3.5–5.2)
Sodium: 142 mmol/L (ref 134–144)
eGFR: 58 mL/min/{1.73_m2} — ABNORMAL LOW (ref >59)

## 2022-12-25 LAB — PRO B NATRIURETIC PEPTIDE: NT-Pro BNP: 391 pg/mL (ref 0–738)

## 2022-12-25 NOTE — Telephone Encounter (Signed)
-----  Message from Richardo Priest, MD sent at 12/25/2022 11:46 AM EST ----- Good result continue her current diuretic

## 2022-12-25 NOTE — Telephone Encounter (Signed)
Patient notified of results.

## 2022-12-27 ENCOUNTER — Other Ambulatory Visit: Payer: Self-pay

## 2022-12-27 ENCOUNTER — Telehealth: Payer: Self-pay

## 2022-12-27 ENCOUNTER — Telehealth: Payer: Self-pay | Admitting: *Deleted

## 2022-12-27 DIAGNOSIS — I1A0 Resistant hypertension: Secondary | ICD-10-CM

## 2022-12-27 MED ORDER — HYDRALAZINE HCL 50 MG PO TABS: 50.0000 mg | ORAL_TABLET | Freq: Two times a day (BID) | ORAL | 3 refills | Status: DC

## 2022-12-27 NOTE — Telephone Encounter (Signed)
Received a message from Dr. Bettina Gavia to double the dose of the patients Hydralazine. Patient was taking 25 mg twice daily. Per Dr. Joya Gaskins message her Hydralazine was increased to 50 mg twice daily. Called patient and informed her of the medication change and she had no further questions at this time.

## 2022-12-27 NOTE — Telephone Encounter (Signed)
Called and spoke to the patient, per request of Dr. Bettina Gavia. The patient has resistant hypertension and Dr. Bettina Gavia would like a referral to Dr. Fletcher Anon to discuss a possible renal denervation procedure.   The patient is agreeable. Appointment made for 2/13 with Dr. Fletcher Anon at the [facility] office, per the patient request.

## 2023-01-01 ENCOUNTER — Encounter: Payer: Self-pay | Admitting: *Deleted

## 2023-01-01 ENCOUNTER — Ambulatory Visit: Payer: PPO | Admitting: Podiatry

## 2023-01-01 ENCOUNTER — Encounter: Payer: Self-pay | Admitting: Podiatry

## 2023-01-01 DIAGNOSIS — L603 Nail dystrophy: Secondary | ICD-10-CM | POA: Diagnosis not present

## 2023-01-01 NOTE — Addendum Note (Signed)
Addended by: Ammie Ferrier on: 01/01/2023 03:16 PM   Modules accepted: Orders

## 2023-01-01 NOTE — Progress Notes (Signed)
  Subjective:  Patient ID: Peggy Jennings, female    DOB: May 11, 1946,   MRN: [id number]  Chief Complaint  Patient presents with   Nail Problem    Toe nail fungus. Patient has tried using oral medication years ago. Not diabetic.     77 y.o. female presents for concern of toenail fungus that she has been dealing with for years. She has previously tried oral medication with out much help. Has not tried anything recently. She is not diabetic. Denies any other pedal complaints. Denies n/v/f/c.   Past Medical History:  Diagnosis Date   Allergic rhinitis 02/21/2016   Arthritis 02/21/2016   Asthma    Asthma 02/21/2016   Blood transfusion    Cystocele 12/19/2015   Depression    Dysrhythmia    Hx of atrial fib. See progress note.    Dysuria 08/04/2017   Edema of both lower extremities 07/06/2018   Fibromyalgia    GERD (gastroesophageal reflux disease)    History of anemia    History of atrial fibrillation    ablation performed in 2007.   History of bronchitis    HTN (hypertension) 02/07/2012   Hyperlipemia    Hyperlipidemia 02/10/2012   Hypertension    Hypothyroid 02/21/2016   Intermittent constipation 02/21/2016   Knee pain 01/21/2013   Lesion of bladder 12/19/2015   Obesity 07/02/2016   Paroxysmal SVT (supraventricular tachycardia) 08/21/2016   Periprosthetic fracture around internal prosthetic right knee joint 02/07/2012   Recurrent upper respiratory infection (URI)    Right knee pain 01/03/2014   Shortness of breath    SOB (shortness of breath) on exertion 07/06/2018   Status post revision of total knee replacement 02/07/2012   Trochanteric bursitis of left hip 06/11/2018   Urinary incontinence 12/19/2015   Urinary tract infection 2 months ago     Objective:  Physical Exam: Vascular: DP/PT pulses 2/4 bilateral. CFT <3 seconds. Normal hair growth on digits. No edema.  Skin. No lacerations or abrasions bilateral feet. Nails 1-5 bilateral are thickened and discolored with subungual debris and  pincer nail deformity.  Musculoskeletal: MMT 5/5 bilateral lower extremities in DF, PF, Inversion and Eversion. Deceased ROM in DF of ankle joint.  Neurological: Sensation intact to light touch.   Assessment:   1. Onychodystrophy      Plan:  Patient was evaluated and treated and all questions answered. -Examined patient -Discussed treatment options for painful dystrophic nails  -Clinical picture and Fungal culture was obtained by removing a portion of the hard nail itself from each of the involved toenails using a sterile nail nipper and sent to [facility] lab. Patient tolerated the biopsy procedure well without discomfort or need for anesthesia.  -Discussed fungal nail treatment options including oral, topical, and laser treatments.  -Patient to return in 4 weeks for follow up evaluation and discussion of fungal culture results or sooner if symptoms worsen.   Lorenda Peck, DPM

## 2023-01-07 ENCOUNTER — Encounter: Payer: Self-pay | Admitting: Cardiovascular Disease

## 2023-01-07 ENCOUNTER — Ambulatory Visit: Payer: PPO | Attending: Cardiovascular Disease | Admitting: Cardiovascular Disease

## 2023-01-07 VITALS — BP 148/100 | HR 91 | Ht 65.0 in | Wt 259.5 lb

## 2023-01-07 DIAGNOSIS — I1A0 Resistant hypertension: Secondary | ICD-10-CM | POA: Diagnosis not present

## 2023-01-07 DIAGNOSIS — I471 Supraventricular tachycardia, unspecified: Secondary | ICD-10-CM

## 2023-01-07 MED ORDER — CARVEDILOL 6.25 MG PO TABS: 6.2500 mg | ORAL_TABLET | Freq: Two times a day (BID) | ORAL | 0 refills | Status: DC

## 2023-01-07 NOTE — Progress Notes (Unsigned)
Cardiology Office Note   Date:  01/08/2023   ID:  Peggy Jennings, Peggy Jennings March 05, 1946, MRN [id number]  PCP:  Serita Grammes, MD  Cardiologist: Dr. Bettina Gavia  Chief Complaint  Patient presents with   Other    Consult for Renal denervation c/o weakness with Clonidine. Meds reviewed verbally with pt.      History of Present Illness: Peggy Jennings is a 77 y.o. female who was referred by Dr. Bettina Gavia to consider renal denervation for resistant hypertension. She has known history of essential hypertension, paroxysmal supraventricular tachycardia, sleep apnea on CPAP, obesity, hyperlipidemia and GERD.  She had an echocardiogram in January of this year which showed normal LV systolic function with moderate left ventricular hypertrophy.  She reports prolonged history of difficult to control hypertension with poor tolerance to multiple medications.  She has been on clonidine for many years but recently has not been tolerating the medication very well due to excessive fatigue.  She cut down the dose to once daily.  Since that time, she noticed significant fluctuations in her blood pressure.  Calcium channel blockers were reportedly associated with worsening edema.  She is currently on hydralazine, furosemide twice daily for leg edema, clonidine and telmisartan.  Past Medical History:  Diagnosis Date   Allergic rhinitis 02/21/2016   Arthritis 02/21/2016   Asthma    Asthma 02/21/2016   Blood transfusion    Cystocele 12/19/2015   Depression    Dysrhythmia    Hx of atrial fib. See progress note.    Dysuria 08/04/2017   Edema of both lower extremities 07/06/2018   Fibromyalgia    GERD (gastroesophageal reflux disease)    History of anemia    History of atrial fibrillation    ablation performed in 2007.   History of bronchitis    HTN (hypertension) 02/07/2012   Hyperlipemia    Hyperlipidemia 02/10/2012   Hypertension    Hypothyroid 02/21/2016   Intermittent constipation 02/21/2016   Knee pain  01/21/2013   Lesion of bladder 12/19/2015   Obesity 07/02/2016   Paroxysmal SVT (supraventricular tachycardia) 08/21/2016   Periprosthetic fracture around internal prosthetic right knee joint 02/07/2012   Recurrent upper respiratory infection (URI)    Right knee pain 01/03/2014   Shortness of breath    SOB (shortness of breath) on exertion 07/06/2018   Status post revision of total knee replacement 02/07/2012   Trochanteric bursitis of left hip 06/11/2018   Urinary incontinence 12/19/2015   Urinary tract infection 2 months ago     Past Surgical History:  Procedure Laterality Date   ABDOMINAL HYSTERECTOMY  [place] ELECTROPHYSIOLOGY STUDY AND ABLATION     ESOPHAGOGASTRODUODENOSCOPY     FINGER SURGERY     left index finger, right thumb, cyst removal   FOOT SURGERY Left    Heel spur   JOINT REPLACEMENT  2-3 years   Right Hip   JOINT REPLACEMENT  3-4 years ago   Left Hip   JOINT REPLACEMENT  4 years ago    Right Knee   JOINT REPLACEMENT  10 years ago    Left Knee   JOINT REPLACEMENT  01/20/2012   Right knee   NECK SURGERY     ORIF FEMUR FRACTURE  01/2012   ORIF FEMUR FRACTURE  02/06/2012   Procedure: OPEN REDUCTION INTERNAL FIXATION (ORIF) DISTAL FEMUR FRACTURE;  Surgeon: Rozanna Box, MD;  Location: [facility];  Service:  Orthopedics;  Laterality: Right;   PLANTAR FASCIA SURGERY  7-8 Years    Right Foot   TOTAL KNEE REVISION  01/20/2012   Procedure: TOTAL KNEE REVISION;  Surgeon: Rudean Haskell, MD;  Location: [facility];  Service: Orthopedics;  Laterality: Right;     Current Outpatient Medications  Medication Sig Dispense Refill   albuterol (PROAIR HFA) 108 (90 Base) MCG/ACT inhaler Inhale 1 puff into the lungs every 6 (six) hours as needed.      buPROPion (WELLBUTRIN XL) 300 MG 24 hr tablet Take 300 mg by mouth daily.     CALCIUM PO Take 1 tablet by mouth daily.      carvedilol (COREG) 6.25 MG tablet Take 1 tablet (6.25 mg total) by  mouth 2 (two) times daily. 180 tablet 0   Cholecalciferol (VITAMIN D3) 400 units CAPS Take 400 Units by mouth daily.     cyanocobalamin 100 MCG tablet Take 100 mcg by mouth daily.     furosemide (LASIX) 20 MG tablet Take 1 tablet by mouth 2 (two) times daily.     hydrALAZINE (APRESOLINE) 50 MG tablet Take 1 tablet (50 mg total) by mouth 2 (two) times daily. 180 tablet 3   levocetirizine (XYZAL) 5 MG tablet Take 5 mg by mouth daily.     levothyroxine (SYNTHROID) 112 MCG tablet Take 112 mcg by mouth daily before breakfast.     meloxicam (MOBIC) 15 MG tablet Take 15 mg by mouth daily.     montelukast (SINGULAIR) 10 MG tablet Take 10 mg by mouth at bedtime.     Multiple Vitamin (MULTI-VITAMINS) TABS Take 1 tablet by mouth daily.      MYRBETRIQ 50 MG TB24 tablet Take 50 mg by mouth daily.     nitrofurantoin (MACRODANTIN) 25 MG capsule Take 25 mg by mouth at bedtime.     pantoprazole (PROTONIX) 40 MG tablet Take 40 mg by mouth 2 (two) times daily.     telmisartan (MICARDIS) 80 MG tablet Take 80 mg by mouth daily.      No current facility-administered medications for this visit.    Allergies:   Ace inhibitors, Atorvastatin, Oxycodone-acetaminophen, Percocet [oxycodone-acetaminophen], and Ramipril    Social History:  The patient  reports that she has never smoked. She has never used smokeless tobacco. She reports that she does not drink alcohol and does not use drugs.   Family History:  The patient's family history includes Hypertension in her daughter, father, and mother; Stroke in her father and mother.    ROS:  Please see the history of present illness.   Otherwise, review of systems are positive for none.   All other systems are reviewed and negative.    PHYSICAL EXAM: VS:  BP (!) 148/100 (BP Location: Left Arm, Patient Position: Sitting, Cuff Size: Large)   Pulse 91   Ht 5' 5"$  (1.651 m)   Wt 259 lb 8 oz (117.7 kg)   SpO2 98%   BMI 43.18 kg/m  , BMI Body mass index is 43.18  kg/m. GEN: Well nourished, well developed, in no acute distress  HEENT: normal  Neck: no JVD, carotid bruits, or masses Cardiac: RRR; no murmurs, rubs, or gallops,no edema  Respiratory:  clear to auscultation bilaterally, normal work of breathing GI: soft, nontender, nondistended, + BS MS: no deformity or atrophy  Skin: warm and dry, no rash Neuro:  Strength and sensation are intact Psych: euthymic mood, full affect   EKG:  EKG is ordered today. The ekg ordered  today demonstrates normal sinus rhythm with nonspecific T wave changes.   Recent Labs: 12/23/2022: BUN 15; Creatinine, Ser 1.00; NT-Pro BNP 391; Potassium 4.8; Sodium 142    Lipid Panel    Component Value Date/Time   CHOL 259 (H) 10/06/2017 1002   TRIG 232 (H) 10/06/2017 1002   HDL 51 10/06/2017 1002   CHOLHDL 5.1 (H) 10/06/2017 1002   LDLCALC 162 (H) 10/06/2017 1002      Wt Readings from Last 3 Encounters:  01/07/23 259 lb 8 oz (117.7 kg)  12/05/22 263 lb (119.3 kg)  08/16/22 267 lb (121.1 kg)           No data to display            ASSESSMENT AND PLAN:  1.  Resistant hypertension: Her blood pressure remains uncontrolled and spite of 3 antihypertensive medications.  The significant fluctuation in her blood pressure correlated with taking clonidine once daily as she was not able to tolerate more than that.  I do not think clonidine is a good option for her.  Thus, I discontinued clonidine and started carvedilol 6.25 mg twice daily.  We need to rule out renal artery stenosis before considering renal denervation and thus I requested renal artery duplex. Other medication consideration would be spironolactone. I discussed with her the data regarding renal denervation.  The majority of the studies did not include patients above the age of 77 years old.  The efficacy in this group of patients is not known.  Thus, it might be best to keep renal denervation as a last option.  2.  Sleep apnea: She uses CPAP on a  regular basis.  3.  Paroxysmal supraventricular tachycardia.  No recent episodes.    Disposition:   Will obtain renal artery duplex and follow-up in 2 months.  Signed,  Kathlyn Sacramento, MD  01/08/2023 1:08 PM    [facility]

## 2023-01-07 NOTE — Patient Instructions (Signed)
Medication Instructions:  STOP the Clonidine  START the Carvedilol 6. 25 mg twice daily *If you need a refill on your cardiac medications before your next appointment, please call your pharmacy*   Lab Work: None ordered If you have labs (blood work) drawn today and your tests are completely normal, you will receive your results only by: [place] (if you have MyChart) OR A paper copy in the mail If you have any lab test that is abnormal or we need to change your treatment, we will call you to review the results.   Testing/Procedures: Your physician has requested that you have a renal artery duplex. During this test, an ultrasound is used to evaluate blood flow to the kidneys. Take your medications as you usually do. This will take place at [facility] [id number], [place].  No food after 11PM the night before.  Water is OK. (Don't drink liquids if you have been instructed not to for ANOTHER test). Avoid foods that produce bowel gas, for 24 hours prior to exam (see below). No breakfast, no chewing gum, no smoking or carbonated beverages. Patient may take morning medications with water. Come in for test at least 15 minutes early to register.    Follow-Up: At [facility], you and your health needs are our priority.  As part of our continuing mission to provide you with exceptional heart care, we have created designated Provider Care Teams.  These Care Teams include your primary Cardiologist (physician) and Advanced Practice Providers (APPs -  Physician Assistants and Nurse Practitioners) who all work together to provide you with the care you need, when you need it.  We recommend signing up for the patient portal called "MyChart".  Sign up information is provided on this After Visit Summary.  MyChart is used to connect with patients for Virtual Visits (Telemedicine).  Patients are able to view lab/test results, encounter notes, upcoming  appointments, etc.  Non-urgent messages can be sent to your provider as well.   To learn more about what you can do with MyChart, go to [email or web address].    Your next appointment:   Follow up in 2 months with Dr. Fletcher Anon only

## 2023-01-13 DIAGNOSIS — M791 Myalgia, unspecified site: Secondary | ICD-10-CM | POA: Diagnosis not present

## 2023-01-13 DIAGNOSIS — J22 Unspecified acute lower respiratory infection: Secondary | ICD-10-CM | POA: Diagnosis not present

## 2023-01-13 DIAGNOSIS — R300 Dysuria: Secondary | ICD-10-CM | POA: Diagnosis not present

## 2023-01-23 DIAGNOSIS — J40 Bronchitis, not specified as acute or chronic: Secondary | ICD-10-CM | POA: Diagnosis not present

## 2023-01-23 DIAGNOSIS — Z6841 Body Mass Index (BMI) 40.0 and over, adult: Secondary | ICD-10-CM | POA: Diagnosis not present

## 2023-01-23 DIAGNOSIS — J329 Chronic sinusitis, unspecified: Secondary | ICD-10-CM | POA: Diagnosis not present

## 2023-01-23 DIAGNOSIS — U071 COVID-19: Secondary | ICD-10-CM | POA: Diagnosis not present

## 2023-02-05 ENCOUNTER — Ambulatory Visit: Payer: PPO | Admitting: Podiatry

## 2023-02-05 ENCOUNTER — Encounter: Payer: Self-pay | Admitting: Podiatry

## 2023-02-05 DIAGNOSIS — B351 Tinea unguium: Secondary | ICD-10-CM

## 2023-02-05 MED ORDER — CICLOPIROX 8 % EX SOLN: Freq: Every day | CUTANEOUS | 0 refills | Status: AC

## 2023-02-05 NOTE — Progress Notes (Signed)
  Subjective:  Patient ID: Peggy Jennings, female    DOB: 09/29/1946,   MRN: [id number]  Chief Complaint  Patient presents with   Follow-up    Nail fungus follow up. Discuss nail results.     77 y.o. female presents for follow-up of nail fungus and to discuss results. She is not diabetic. Denies any other pedal complaints. Denies n/v/f/c.   Past Medical History:  Diagnosis Date   Allergic rhinitis 02/21/2016   Arthritis 02/21/2016   Asthma    Asthma 02/21/2016   Blood transfusion    Cystocele 12/19/2015   Depression    Dysrhythmia    Hx of atrial fib. See progress note.    Dysuria 08/04/2017   Edema of both lower extremities 07/06/2018   Fibromyalgia    GERD (gastroesophageal reflux disease)    History of anemia    History of atrial fibrillation    ablation performed in 2007.   History of bronchitis    HTN (hypertension) 02/07/2012   Hyperlipemia    Hyperlipidemia 02/10/2012   Hypertension    Hypothyroid 02/21/2016   Intermittent constipation 02/21/2016   Knee pain 01/21/2013   Lesion of bladder 12/19/2015   Obesity 07/02/2016   Paroxysmal SVT (supraventricular tachycardia) 08/21/2016   Periprosthetic fracture around internal prosthetic right knee joint 02/07/2012   Recurrent upper respiratory infection (URI)    Right knee pain 01/03/2014   Shortness of breath    SOB (shortness of breath) on exertion 07/06/2018   Status post revision of total knee replacement 02/07/2012   Trochanteric bursitis of left hip 06/11/2018   Urinary incontinence 12/19/2015   Urinary tract infection 2 months ago     Objective:  Physical Exam: Vascular: DP/PT pulses 2/4 bilateral. CFT <3 seconds. Normal hair growth on digits. No edema.  Skin. No lacerations or abrasions bilateral feet. Nails 1-5 bilateral are thickened and discolored with subungual debris and pincer nail deformity.  Musculoskeletal: MMT 5/5 bilateral lower extremities in DF, PF, Inversion and Eversion. Deceased ROM in DF of ankle joint.   Neurological: Sensation intact to light touch.   Assessment:   1. Onychomycosis       Plan:  Patient was evaluated and treated and all questions answered. -Examined patient -Discussed treatment options for painful dystrophic nails  -Cutlures positive for aspergillus and culvaria specias.  -Discussed fungal nail treatment options including oral, topical, and laser treatments.  -Patient elects to try topical treatments as has concerns for her liver and has had issues in the past. Penlac prescribed.  -Patient to return in 3 months    Lorenda Peck, DPM

## 2023-02-24 ENCOUNTER — Ambulatory Visit: Payer: PPO | Attending: Cardiovascular Disease

## 2023-02-24 DIAGNOSIS — I10 Essential (primary) hypertension: Secondary | ICD-10-CM | POA: Diagnosis not present

## 2023-02-24 DIAGNOSIS — I1A0 Resistant hypertension: Secondary | ICD-10-CM

## 2023-02-28 ENCOUNTER — Telehealth: Payer: Self-pay | Admitting: Cardiovascular Disease

## 2023-02-28 NOTE — Telephone Encounter (Signed)
Attempted to call the patient. No answer- I left a detailed message of results (ok per DPR) and advised her to keep her follow up appointment as scheduled on 03/11/23. I asked that she call back with any questions/ concerns prior.

## 2023-02-28 NOTE — Telephone Encounter (Signed)
Iran Ouch, MD 02/28/2023  1:27 PM EDT     No evidence of renal artery stenosis.

## 2023-03-04 ENCOUNTER — Encounter: Payer: Self-pay | Admitting: *Deleted

## 2023-03-06 ENCOUNTER — Ambulatory Visit: Payer: PPO | Admitting: Cardiology

## 2023-03-11 ENCOUNTER — Encounter: Payer: Self-pay | Admitting: Cardiovascular Disease

## 2023-03-11 ENCOUNTER — Ambulatory Visit: Payer: PPO | Attending: Cardiovascular Disease | Admitting: Cardiovascular Disease

## 2023-03-11 VITALS — BP 178/90 | HR 65 | Ht 65.0 in | Wt 266.2 lb

## 2023-03-11 DIAGNOSIS — I471 Supraventricular tachycardia, unspecified: Secondary | ICD-10-CM

## 2023-03-11 DIAGNOSIS — I1A0 Resistant hypertension: Secondary | ICD-10-CM | POA: Diagnosis not present

## 2023-03-11 DIAGNOSIS — G4730 Sleep apnea, unspecified: Secondary | ICD-10-CM

## 2023-03-11 MED ORDER — HYDROCHLOROTHIAZIDE 25 MG PO TABS: 25.0000 mg | ORAL_TABLET | Freq: Every day | ORAL | 0 refills | Status: DC

## 2023-03-11 NOTE — Progress Notes (Unsigned)
Cardiology Office Note   Date:  03/12/2023   ID:  Peggy, Jennings 1946/07/06, MRN [id number]  PCP:  Buckner Malta, MD  Cardiologist: Dr. Dulce Sellar  Chief Complaint  Patient presents with   Follow-up    2 month f/u no complaints today. Meds reviewed verbally with pt.      History of Present Illness: Peggy Jennings is a 77 y.o. female who is here today for follow-up visit regarding resistant hypertension and consideration of renal denervation.   She has known history of essential hypertension, paroxysmal supraventricular tachycardia, sleep apnea on CPAP, obesity with a BMI of 44, hyperlipidemia and GERD.  She had an echocardiogram in January of this year which showed normal LV systolic function with moderate left ventricular hypertrophy.  She reports prolonged history of difficult to control hypertension with poor tolerance to multiple medications.  She has been on clonidine for many years but recently has not been tolerating the medication very well due to excessive fatigue.   Calcium channel blockers were reportedly associated with worsening edema.    During last visit, I switch clonidine to carvedilol.  Renal artery duplex was done and showed no evidence of renal artery stenosis.  She reports improvement in fatigue since she stopped taking clonidine and her blood pressure has been more consistent but still not controlled.  She takes furosemide 20 mg daily mainly for lower extremity edema.  Past Medical History:  Diagnosis Date   Allergic rhinitis 02/21/2016   Arthritis 02/21/2016   Asthma    Asthma 02/21/2016   Blood transfusion    Cystocele 12/19/2015   Depression    Dysrhythmia    Hx of atrial fib. See progress note.    Dysuria 08/04/2017   Edema of both lower extremities 07/06/2018   Fibromyalgia    GERD (gastroesophageal reflux disease)    History of anemia    History of atrial fibrillation    ablation performed in 2007.   History of bronchitis    HTN  (hypertension) 02/07/2012   Hyperlipemia    Hyperlipidemia 02/10/2012   Hypertension    Hypothyroid 02/21/2016   Intermittent constipation 02/21/2016   Knee pain 01/21/2013   Lesion of bladder 12/19/2015   Obesity 07/02/2016   Paroxysmal SVT (supraventricular tachycardia) 08/21/2016   Periprosthetic fracture around internal prosthetic right knee joint 02/07/2012   Recurrent upper respiratory infection (URI)    Right knee pain 01/03/2014   Shortness of breath    SOB (shortness of breath) on exertion 07/06/2018   Status post revision of total knee replacement 02/07/2012   Trochanteric bursitis of left hip 06/11/2018   Urinary incontinence 12/19/2015   Urinary tract infection 2 months ago     Past Surgical History:  Procedure Laterality Date   ABDOMINAL HYSTERECTOMY  1977   ACHILLES TENDON REPAIR     APPENDECTOMY  1960   [place] ELECTROPHYSIOLOGY STUDY AND ABLATION     ESOPHAGOGASTRODUODENOSCOPY     FINGER SURGERY     left index finger, right thumb, cyst removal   FOOT SURGERY Left    Heel spur   JOINT REPLACEMENT  2-3 years   Right Hip   JOINT REPLACEMENT  3-4 years ago   Left Hip   JOINT REPLACEMENT  4 years ago    Right Knee   JOINT REPLACEMENT  10 years ago    Left Knee   JOINT REPLACEMENT  01/20/2012   Right knee   NECK SURGERY     ORIF FEMUR FRACTURE  01/2012   ORIF FEMUR FRACTURE  02/06/2012   Procedure: OPEN REDUCTION INTERNAL FIXATION (ORIF) DISTAL FEMUR FRACTURE;  Surgeon: Budd Palmer, MD;  Location[facility];  Service: Orthopedics;  Laterality: Right;   PLANTAR FASCIA SURGERY  7-8 Years    Right Foot   TOTAL KNEE REVISION  01/20/2012   Procedure: TOTAL KNEE REVISION;  Surgeon: Raymon Mutton, MD;  Location: [facility];  Service: Orthopedics;  Laterality: Right;     Current Outpatient Medications  Medication Sig Dispense Refill   albuterol (PROAIR HFA) 108 (90 Base) MCG/ACT inhaler Inhale 1 puff into the lungs every 6 (six) hours as needed.      buPROPion (WELLBUTRIN XL) 300  MG 24 hr tablet Take 300 mg by mouth daily.     CALCIUM PO Take 1 tablet by mouth daily.      carvedilol (COREG) 6.25 MG tablet Take 1 tablet (6.25 mg total) by mouth 2 (two) times daily. 180 tablet 0   Cholecalciferol (VITAMIN D3) 400 units CAPS Take 400 Units by mouth daily.     ciclopirox (PENLAC) 8 % solution Apply topically at bedtime. Apply over nail and surrounding skin. Apply daily over previous coat. After seven (7) days, may remove with alcohol and continue cycle. 6.6 mL 0   cyanocobalamin 100 MCG tablet Take 100 mcg by mouth daily.     hydrALAZINE (APRESOLINE) 50 MG tablet Take 1 tablet (50 mg total) by mouth 2 (two) times daily. 180 tablet 3   hydrochlorothiazide (HYDRODIURIL) 25 MG tablet Take 1 tablet (25 mg total) by mouth daily. 90 tablet 0   levocetirizine (XYZAL) 5 MG tablet Take 5 mg by mouth daily.     levothyroxine (SYNTHROID) 112 MCG tablet Take 112 mcg by mouth daily before breakfast.     meloxicam (MOBIC) 15 MG tablet Take 15 mg by mouth daily.     montelukast (SINGULAIR) 10 MG tablet Take 10 mg by mouth at bedtime.     Multiple Vitamin (MULTI-VITAMINS) TABS Take 1 tablet by mouth daily.      MYRBETRIQ 50 MG TB24 tablet Take 50 mg by mouth daily.     pantoprazole (PROTONIX) 40 MG tablet Take 40 mg by mouth 2 (two) times daily.     telmisartan (MICARDIS) 80 MG tablet Take 80 mg by mouth daily.      No current facility-administered medications for this visit.    Allergies:   Ace inhibitors, Atorvastatin, Oxycodone-acetaminophen, Percocet [oxycodone-acetaminophen], and Ramipril    Social History:  The patient  reports that she has never smoked. She has never used smokeless tobacco. She reports that she does not drink alcohol and does not use drugs.   Family History:  The patient's family history includes Hypertension in her daughter, father, and mother; Stroke in her father and mother.    ROS:  Please see the history of present illness.   Otherwise, review of systems  are positive for none.   All other systems are reviewed and negative.    PHYSICAL EXAM: VS:  BP (!) 178/90 (BP Location: Right Arm, Patient Position: Sitting, Cuff Size: Large)   Pulse 65   Ht  (1.651 m)   Wt 266 lb 4 oz (120.8 kg)   SpO2 97%   BMI 44.31 kg/m  , BMI Body mass index is 44.31 kg/m. GEN: Well nourished, well developed, in no acute distress  HEENT: normal  Neck: no JVD, carotid bruits, or masses Cardiac: RRR; no murmurs, rubs, or gallops,no edema  Respiratory:  clear to auscultation bilaterally, normal work of breathing GI: soft, nontender, nondistended, + BS MS: no deformity or atrophy  Skin: warm and dry, no rash Neuro:  Strength and sensation are intact Psych: euthymic mood, full affect   EKG:  EKG is ordered today. The ekg ordered today demonstrates normal sinus rhythm with nonspecific T wave changes.   Recent Labs: 12/23/2022: BUN 15; Creatinine, Ser 1.00; NT-Pro BNP 391; Potassium 4.8; Sodium 142    Lipid Panel    Component Value Date/Time   CHOL 259 (H) 10/06/2017 1002   TRIG 232 (H) 10/06/2017 1002   HDL 51 10/06/2017 1002   CHOLHDL 5.1 (H) 10/06/2017 1002   LDLCALC 162 (H) 10/06/2017 1002      Wt Readings from Last 3 Encounters:  03/11/23 266 lb 4 oz (120.8 kg)  01/07/23 259 lb 8 oz (117.7 kg)  12/05/22 263 lb (119.3 kg)           No data to display            ASSESSMENT AND PLAN:  1.  Resistant hypertension: Her symptoms improved after stopping clonidine but her blood pressure still not controlled.  I recommend switching furosemide to hydrochlorothiazide 25 mg once daily.  Continue hydralazine and telmisartan.   I again discussed that indication for renal denervation and explained to her that the majority of patients included in the studies were below 12 years old.  I prefer to keep renal denervation as a last resort after adjusting her medications.  Renal artery duplex showed no evidence of renal artery stenosis. In addition,  I suspect that the obesity and sleep apnea is contributing to her blood pressure.  2.  Sleep apnea: She uses CPAP on a regular basis.  3.  Paroxysmal supraventricular tachycardia.  No recent episodes.    Disposition:   Follow-up in 3 months.  Signed,  Lorine Bears, MD  03/12/2023 11:07 AM    [facility]

## 2023-03-11 NOTE — Patient Instructions (Addendum)
Medication Instructions:  STOP the Furosemide  START Hydrochlorothiazide 25 mg once daily  *If you need a refill on your cardiac medications before your next appointment, please call your pharmacy*   Lab Work: Your provider would like for you to return in one week to have the following labs drawn: BMET.   Please go to the [facility] entrance and check in at the front desk.  You do not need an appointment.  They are open from 7am-6 pm.  You will not need to be fasting.  If you have labs (blood work) drawn today and your tests are completely normal, you will receive your results only by: MyChart Message (if you have MyChart) OR A paper copy in the mail If you have any lab test that is abnormal or we need to change your treatment, we will call you to review the results.   Testing/Procedures: None ordered   Follow-Up: At [facility], you and your health needs are our priority.  As part of our continuing mission to provide you with exceptional heart care, we have created designated Provider Care Teams.  These Care Teams include your primary Cardiologist (physician) and Advanced Practice Providers (APPs -  Physician Assistants and Nurse Practitioners) who all work together to provide you with the care you need, when you need it.  We recommend signing up for the patient portal called "MyChart".  Sign up information is provided on this After Visit Summary.  MyChart is used to connect with patients for Virtual Visits (Telemedicine).  Patients are able to view lab/test results, encounter notes, upcoming appointments, etc.  Non-urgent messages can be sent to your provider as well.   To learn more about what you can do with MyChart, go to [email or web address].    Your next appointment:   3 month(s)  Provider:   You may see Dr. Kirke Corin or one of the following Advanced Practice Providers on your designated Care Team:   Nicolasa Ducking, NP Eula Listen, PA-C Cadence Fransico Michael,  PA-C Charlsie Quest, NP

## 2023-03-20 ENCOUNTER — Other Ambulatory Visit
Admission: RE | Admit: 2023-03-20 | Discharge: 2023-03-20 | Disposition: A | Discharge status: Home / Self Care | Payer: PPO | Source: Ambulatory Visit | Attending: Cardiovascular Disease | Admitting: Cardiovascular Disease

## 2023-03-20 DIAGNOSIS — I471 Supraventricular tachycardia, unspecified: Secondary | ICD-10-CM | POA: Diagnosis not present

## 2023-03-20 DIAGNOSIS — I1A0 Resistant hypertension: Secondary | ICD-10-CM | POA: Insufficient documentation

## 2023-03-20 LAB — BASIC METABOLIC PANEL
Anion gap: 7 (ref 5–15)
BUN: 23 mg/dL (ref 8–23)
CO2: 25 mmol/L (ref 22–32)
Calcium: 8.7 mg/dL — ABNORMAL LOW (ref 8.9–10.3)
Chloride: 105 mmol/L (ref 98–111)
Creatinine, Ser: 0.95 mg/dL (ref 0.44–1.00)
GFR, Estimated: >60 mL/min (ref >60)
Glucose, Bld: 137 mg/dL — ABNORMAL HIGH (ref 70–99)
Potassium: 3.8 mmol/L (ref 3.5–5.1)
Sodium: 137 mmol/L (ref 135–145)

## 2023-03-21 DIAGNOSIS — Z6841 Body Mass Index (BMI) 40.0 and over, adult: Secondary | ICD-10-CM | POA: Diagnosis not present

## 2023-03-21 DIAGNOSIS — N3000 Acute cystitis without hematuria: Secondary | ICD-10-CM | POA: Diagnosis not present

## 2023-04-03 DIAGNOSIS — D485 Neoplasm of uncertain behavior of skin: Secondary | ICD-10-CM | POA: Diagnosis not present

## 2023-04-03 DIAGNOSIS — L821 Other seborrheic keratosis: Secondary | ICD-10-CM | POA: Diagnosis not present

## 2023-04-05 ENCOUNTER — Other Ambulatory Visit: Payer: Self-pay | Admitting: Cardiovascular Disease

## 2023-04-07 DIAGNOSIS — G4733 Obstructive sleep apnea (adult) (pediatric): Secondary | ICD-10-CM | POA: Diagnosis not present

## 2023-04-23 DIAGNOSIS — Z6841 Body Mass Index (BMI) 40.0 and over, adult: Secondary | ICD-10-CM | POA: Diagnosis not present

## 2023-04-23 DIAGNOSIS — R301 Vesical tenesmus: Secondary | ICD-10-CM | POA: Diagnosis not present

## 2023-04-23 DIAGNOSIS — I10 Essential (primary) hypertension: Secondary | ICD-10-CM | POA: Diagnosis not present

## 2023-04-23 DIAGNOSIS — N3001 Acute cystitis with hematuria: Secondary | ICD-10-CM | POA: Diagnosis not present

## 2023-05-07 ENCOUNTER — Ambulatory Visit: Payer: PPO | Admitting: Podiatry

## 2023-05-20 DIAGNOSIS — Z6841 Body Mass Index (BMI) 40.0 and over, adult: Secondary | ICD-10-CM | POA: Diagnosis not present

## 2023-05-20 DIAGNOSIS — N3000 Acute cystitis without hematuria: Secondary | ICD-10-CM | POA: Diagnosis not present

## 2023-05-20 DIAGNOSIS — Z1331 Encounter for screening for depression: Secondary | ICD-10-CM | POA: Diagnosis not present

## 2023-05-22 NOTE — Progress Notes (Signed)
Cardiology Office Note:    Date:  05/23/2023   ID:  Peggy Jennings, DOB 1946/03/03, MRN [id number]  PCP:  Buckner Malta, MD  Cardiologist:  Norman Herrlich, MD    Referring MD: Buckner Malta, MD    ASSESSMENT:    1. Resistant hypertension   2. Paroxysmal SVT (supraventricular tachycardia)    PLAN:    In order of problems listed above:  Continue current treatment for the time being consideration renal deprivation has been deferred She tells me she will follow-up labs including BMP in the next few weeks Continue other antihypertensives carvedilol hydralazine Micardis, no longer taking clonidine.   Next appointment: 6 months   Medication Adjustments/Labs and Tests Ordered: Current medicines are reviewed at length with the patient today.  Concerns regarding medicines are outlined above.  No orders of the defined types were placed in this encounter.  No orders of the defined types were placed in this encounter.    History of Present Illness:    Peggy Jennings is a 77 y.o. female with a hx of resistant hypertension hyperlipidemia paroxysmal SVT last seen by me 12/05/2022.  I have referred her for percutaneous renal denervation meds were adjusted the procedure was felt not to be necessary at this time  compliance with diet, lifestyle and medications: Yes  She is pleased her blood pressure neuropain is improved and I will look at her record she seems to run mostly in the range 150 160/80-90 at home she attributes the higher numbers to pain In the office today her blood pressure is at target However to check her renal function but she tells me she is having office follow-up in 2 weeks Past Medical History:  Diagnosis Date   Allergic rhinitis 02/21/2016   Arthritis 02/21/2016   Asthma    Asthma 02/21/2016   Blood transfusion    Cystocele 12/19/2015   Depression    Dysrhythmia    Hx of atrial fib. See progress note.    Dysuria 08/04/2017   Edema of both lower  extremities 07/06/2018   Fibromyalgia    GERD (gastroesophageal reflux disease)    History of anemia    History of atrial fibrillation    ablation performed in 2007.   History of bronchitis    HTN (hypertension) 02/07/2012   Hyperlipemia    Hyperlipidemia 02/10/2012   Hypertension    Hypothyroid 02/21/2016   Intermittent constipation 02/21/2016   Knee pain 01/21/2013   Lesion of bladder 12/19/2015   Obesity 07/02/2016   Paroxysmal SVT (supraventricular tachycardia) 08/21/2016   Periprosthetic fracture around internal prosthetic right knee joint 02/07/2012   Recurrent upper respiratory infection (URI)    Right knee pain 01/03/2014   Shortness of breath    SOB (shortness of breath) on exertion 07/06/2018   Status post revision of total knee replacement 02/07/2012   Trochanteric bursitis of left hip 06/11/2018   Urinary incontinence 12/19/2015   Urinary tract infection 2 months ago     Past Surgical History:  Procedure Laterality Date   ABDOMINAL HYSTERECTOMY  1977   ACHILLES TENDON REPAIR     APPENDECTOMY  1960   CARDIAC ELECTROPHYSIOLOGY STUDY AND ABLATION     ESOPHAGOGASTRODUODENOSCOPY     FINGER SURGERY     left index finger, right thumb, cyst removal   FOOT SURGERY Left    Heel spur   JOINT REPLACEMENT  2-3 years   Right Hip   JOINT REPLACEMENT  3-4 years ago   Left Hip  JOINT REPLACEMENT  4 years ago    Right Knee   JOINT REPLACEMENT  10 years ago    Left Knee   JOINT REPLACEMENT  01/20/2012   Right knee   NECK SURGERY     ORIF FEMUR FRACTURE  01/2012   ORIF FEMUR FRACTURE  02/06/2012   Procedure: OPEN REDUCTION INTERNAL FIXATION (ORIF) DISTAL FEMUR FRACTURE;  Surgeon: Budd Palmer, MD;  Location: [facility];  Service: Orthopedics;  Laterality: Right;   PLANTAR FASCIA SURGERY  7-8 Years    Right Foot   TOTAL KNEE REVISION  01/20/2012   Procedure: TOTAL KNEE REVISION;  Surgeon: Raymon Mutton, MD;  Location: [facility] OR;  Service: Orthopedics;  Laterality: Right;    Current  Medications: Current Meds  Medication Sig   albuterol (PROAIR HFA) 108 (90 Base) MCG/ACT inhaler Inhale 1 puff into the lungs every 6 (six) hours as needed.    buPROPion (WELLBUTRIN XL) 300 MG 24 hr tablet Take 300 mg by mouth daily.   CALCIUM PO Take 1 tablet by mouth daily.    carvedilol (COREG) 6.25 MG tablet TAKE 1 TABLET BY MOUTH TWICE DAILY.   Cholecalciferol (VITAMIN D3) 400 units CAPS Take 400 Units by mouth daily.   ciclopirox (PENLAC) 8 % solution Apply topically at bedtime. Apply over nail and surrounding skin. Apply daily over previous coat. After seven (7) days, may remove with alcohol and continue cycle.   cyanocobalamin 100 MCG tablet Take 100 mcg by mouth daily.   hydrALAZINE (APRESOLINE) 50 MG tablet Take 1 tablet (50 mg total) by mouth 2 (two) times daily.   hydrochlorothiazide (HYDRODIURIL) 25 MG tablet Take 1 tablet (25 mg total) by mouth daily.   levocetirizine (XYZAL) 5 MG tablet Take 5 mg by mouth daily.   levothyroxine (SYNTHROID) 112 MCG tablet Take 112 mcg by mouth daily before breakfast.   meloxicam (MOBIC) 15 MG tablet Take 15 mg by mouth daily.   montelukast (SINGULAIR) 10 MG tablet Take 10 mg by mouth at bedtime.   Multiple Vitamin (MULTI-VITAMINS) TABS Take 1 tablet by mouth daily.    MYRBETRIQ 50 MG TB24 tablet Take 50 mg by mouth daily.   pantoprazole (PROTONIX) 40 MG tablet Take 40 mg by mouth 2 (two) times daily.   telmisartan (MICARDIS) 80 MG tablet Take 80 mg by mouth daily.      Allergies:   Ace inhibitors, Atorvastatin, Oxycodone-acetaminophen, Percocet [oxycodone-acetaminophen], and Ramipril   EKGs/Labs/Other Studies Reviewed:    The following studies were reviewed today:  Cardiac Studies & Procedures       ECHOCARDIOGRAM  ECHOCARDIOGRAM COMPLETE 12/23/2022  Narrative ECHOCARDIOGRAM REPORT    Patient Name:   Peggy Jennings Date of Exam: 12/23/2022 Medical Rec #:  [id number]        Height:       66.0 in Accession #:    [id number]        Weight:       263.0 lb Date of Birth:  [birth year]        BSA:          2.247 m Patient Age:    76 years         BP:           151/82 mmHg Patient Gender: F                HR:           56 bpm. Exam Location:  [facility]  Procedure: 2D Echo,  Cardiac Doppler, Color Doppler and Strain Analysis  Indications:    SOB (shortness of breath) on exertion [R06.02 (ICD-10-CM)]  History:        Patient has prior history of Echocardiogram examinations, most recent 06/21/2019. Arrythmias:Paroxysmal SVT (supraventricular tachycardia); Risk Factors:Hypertension.  Sonographer:    Louie Boston RDCS Referring Phys: [id number] Sharleen Szczesny J Drae Mitzel  IMPRESSIONS   1. Left ventricular ejection fraction, by estimation, is 55 to 60%. The left ventricle has normal function. The left ventricle has no regional wall motion abnormalities. There is moderate concentric left ventricular hypertrophy. Left ventricular diastolic parameters are consistent with Grade I diastolic dysfunction (impaired relaxation). Elevated left ventricular end-diastolic pressure. 2. Right ventricular systolic function is normal. The right ventricular size is normal. There is normal pulmonary artery systolic pressure. 3. The mitral valve is normal in structure. Trivial mitral valve regurgitation. No evidence of mitral stenosis. 4. The aortic valve is normal in structure. Aortic valve regurgitation is mild. Aortic valve sclerosis is present, with no evidence of aortic valve stenosis. 5. Aortic Normal DTA. 6. The inferior vena cava is dilated in size with >50% respiratory variability, suggesting right atrial pressure of 8 mmHg.  FINDINGS Left Ventricle: Left ventricular ejection fraction, by estimation, is 55 to 60%. The left ventricle has normal function. The left ventricle has no regional wall motion abnormalities. Global longitudinal strain performed but not reported based on interpreter judgement due to suboptimal tracking. The left ventricular internal  cavity size was normal in size. There is moderate concentric left ventricular hypertrophy. Left ventricular diastolic parameters are consistent with Grade I diastolic dysfunction (impaired relaxation). Elevated left ventricular end-diastolic pressure.  Right Ventricle: The right ventricular size is normal. No increase in right ventricular wall thickness. Right ventricular systolic function is normal. There is normal pulmonary artery systolic pressure. The tricuspid regurgitant velocity is 1.59 m/s, and with an assumed right atrial pressure of 8 mmHg, the estimated right ventricular systolic pressure is 18.1 mmHg.  Left Atrium: Left atrial size was normal in size.  Right Atrium: Right atrial size was normal in size.  Pericardium: There is no evidence of pericardial effusion.  Mitral Valve: The mitral valve is normal in structure. Trivial mitral valve regurgitation. No evidence of mitral valve stenosis.  Tricuspid Valve: The tricuspid valve is normal in structure. Tricuspid valve regurgitation is mild . No evidence of tricuspid stenosis.  Aortic Valve: The aortic valve is normal in structure. Aortic valve regurgitation is mild. Aortic regurgitation PHT measures 599 msec. Aortic valve sclerosis is present, with no evidence of aortic valve stenosis.  Pulmonic Valve: The pulmonic valve was normal in structure. Pulmonic valve regurgitation is not visualized. No evidence of pulmonic stenosis.  Aorta: The aortic arch was not well visualized, the aortic root and ascending aorta are structurally normal, with no evidence of dilitation and Normal DTA.  Venous: The inferior vena cava is dilated in size with greater than 50% respiratory variability, suggesting right atrial pressure of 8 mmHg.  IAS/Shunts: No atrial level shunt detected by color flow Doppler.   LEFT VENTRICLE PLAX 2D LVIDd:         4.60 cm   Diastology LVIDs:         3.40 cm   LV e' medial:    4.06 cm/s LV PW:         1.40 cm   LV  E/e' medial:  21.9 LV IVS:        1.40 cm   LV e' lateral:   4.00 cm/s  LVOT diam:     2.10 cm   LV E/e' lateral: 22.3 LV SV:         96 LV SV Index:   43 LVOT Area:     3.46 cm   RIGHT VENTRICLE             IVC RV S prime:     10.70 cm/s  IVC diam: 2.10 cm TAPSE (M-mode): 2.6 cm  LEFT ATRIUM             Index        RIGHT ATRIUM           Index LA diam:        3.70 cm 1.65 cm/m   RA Area:     17.30 cm LA Vol (A2C):   78.5 ml 34.94 ml/m  RA Volume:   46.20 ml  20.56 ml/m LA Vol (A4C):   63.0 ml 28.04 ml/m LA Biplane Vol: 70.7 ml 31.47 ml/m AORTIC VALVE LVOT Vmax:   96.05 cm/s LVOT Vmean:  68.100 cm/s LVOT VTI:    0.278 m AI PHT:      599 msec  AORTA Ao Root diam: 3.20 cm Ao Asc diam:  3.50 cm Ao Desc diam: 2.50 cm  MITRAL VALVE               TRICUSPID VALVE MV Area (PHT): 3.77 cm    TR Peak grad:   10.1 mmHg MV Decel Time: 201 msec    TR Vmax:        159.00 cm/s MV E velocity: 89.10 cm/s MV A velocity: 96.00 cm/s  SHUNTS MV E/A ratio:  0.93        Systemic VTI:  0.28 m Systemic Diam: 2.10 cm  Norman Herrlich MD Electronically signed by Norman Herrlich MD Signature Date/Time: 12/23/2022/5:09:36 PM    Final                 Recent Labs: 12/23/2022: NT-Pro BNP 391 03/20/2023: BUN 23; Creatinine, Ser 0.95; Potassium 3.8; Sodium 137  Recent Lipid Panel    Component Value Date/Time   CHOL 259 (H) 10/06/2017 1002   TRIG 232 (H) 10/06/2017 1002   HDL 51 10/06/2017 1002   CHOLHDL 5.1 (H) 10/06/2017 1002   LDLCALC 162 (H) 10/06/2017 1002    Physical Exam:    VS:  BP 132/70   Pulse 60   Ht 5\' 5"  (1.651 m)   Wt 262 lb 6.4 oz (119 kg)   BMI 43.67 kg/m     Wt Readings from Last 3 Encounters:  05/23/23 262 lb 6.4 oz (119 kg)  03/11/23 266 lb 4 oz (120.8 kg)  01/07/23 259 lb 8 oz (117.7 kg)     GEN:  Well nourished, well developed in no acute distress HEENT: Normal NECK: No JVD; No carotid bruits LYMPHATICS: No lymphadenopathy CARDIAC: RRR, no murmurs,  rubs, gallops RESPIRATORY:  Clear to auscultation without rales, wheezing or rhonchi  ABDOMEN: Soft, non-tender, non-distended MUSCULOSKELETAL:  No edema; No deformity  SKIN: Warm and dry NEUROLOGIC:  Alert and oriented x 3 PSYCHIATRIC:  Normal affect    Signed, Norman Herrlich, MD  05/23/2023 1:55 PM    [facility]

## 2023-05-23 ENCOUNTER — Encounter: Payer: Self-pay | Admitting: Cardiology

## 2023-05-23 ENCOUNTER — Ambulatory Visit: Payer: PPO | Attending: Cardiology | Admitting: Cardiology

## 2023-05-23 VITALS — BP 132/70 | HR 60 | Ht 65.0 in | Wt 262.4 lb

## 2023-05-23 DIAGNOSIS — I471 Supraventricular tachycardia, unspecified: Secondary | ICD-10-CM

## 2023-05-23 DIAGNOSIS — I1A0 Resistant hypertension: Secondary | ICD-10-CM

## 2023-05-23 NOTE — Patient Instructions (Signed)
Medication Instructions:  Your physician recommends that you continue on your current medications as directed. Please refer to the Current Medication list given to you today.  *If you need a refill on your cardiac medications before your next appointment, please call your pharmacy*   Lab Work: None If you have labs (blood work) drawn today and your tests are completely normal, you will receive your results only by: MyChart Message (if you have MyChart) OR A paper copy in the mail If you have any lab test that is abnormal or we need to change your treatment, we will call you to review the results.   Testing/Procedures: None   Follow-Up: At [facility], you and your health needs are our priority.  As part of our continuing mission to provide you with exceptional heart care, we have created designated Provider Care Teams.  These Care Teams include your primary Cardiologist (physician) and Advanced Practice Providers (APPs -  Physician Assistants and Nurse Practitioners) who all work together to provide you with the care you need, when you need it.  We recommend signing up for the patient portal called "MyChart".  Sign up information is provided on this After Visit Summary.  MyChart is used to connect with patients for Virtual Visits (Telemedicine).  Patients are able to view lab/test results, encounter notes, upcoming appointments, etc.  Non-urgent messages can be sent to your provider as well.   To learn more about what you can do with MyChart, go to [email or web address].    Your next appointment:   6 month(s)  Provider:   Brian Munley, MD    Other Instructions None  

## 2023-05-30 ENCOUNTER — Other Ambulatory Visit: Payer: Self-pay | Admitting: Cardiovascular Disease

## 2023-06-10 ENCOUNTER — Encounter: Payer: Self-pay | Admitting: Cardiovascular Disease

## 2023-06-10 ENCOUNTER — Ambulatory Visit: Payer: PPO | Attending: Cardiovascular Disease | Admitting: Cardiovascular Disease

## 2023-06-10 VITALS — BP 158/80 | HR 64 | Ht 65.0 in | Wt 264.0 lb

## 2023-06-10 DIAGNOSIS — R0602 Shortness of breath: Secondary | ICD-10-CM | POA: Diagnosis not present

## 2023-06-10 DIAGNOSIS — I1A0 Resistant hypertension: Secondary | ICD-10-CM | POA: Diagnosis not present

## 2023-06-10 DIAGNOSIS — I471 Supraventricular tachycardia, unspecified: Secondary | ICD-10-CM | POA: Diagnosis not present

## 2023-06-10 DIAGNOSIS — E782 Mixed hyperlipidemia: Secondary | ICD-10-CM

## 2023-06-10 MED ORDER — SPIRONOLACTONE 25 MG PO TABS: 25.0000 mg | ORAL_TABLET | Freq: Every day | ORAL | 0 refills | Status: DC

## 2023-06-10 NOTE — Patient Instructions (Signed)
Medication Instructions:  STOP the Hydralazine  START Spironolactone 25 mg once daily  *If you need a refill on your cardiac medications before your next appointment, please call your pharmacy*   Lab Work: Your provider would like for you to return in one week to have the following labs drawn: BMET.   You may also go to any of these [facility] locations:    [place] - [address]    If you have labs (blood work) drawn today and your tests are completely normal, you will receive your results only by: MyChart Message (if you have MyChart) OR A paper copy in the mail If you have any lab test that is abnormal or we need to change your treatment, we will call you to review the results.   Testing/Procedures: None ordered   Follow-Up: At [facility], you and your health needs are our priority.  As part of our continuing mission to provide you with exceptional heart care, we have created designated Provider Care Teams.  These Care Teams include your primary Cardiologist (physician) and Advanced Practice Providers (APPs -  Physician Assistants and Nurse Practitioners) who all work together to provide you with the care you need, when you need it.  We recommend signing up for the patient portal called "MyChart".  Sign up information is provided on this After Visit Summary.  MyChart is used to connect with patients for Virtual Visits (Telemedicine).  Patients are able to view lab/test results, encounter notes, upcoming appointments, etc.  Non-urgent messages can be sent to your provider as well.   To learn more about what you can do with MyChart, go to [email or web address].    Your next appointment:   3 month(s)  Provider:   You may see Dr. Kirke Corin or one of the following Advanced Practice Providers on your designated Care Team:   Nicolasa Ducking, NP Eula Listen, PA-C Cadence Fransico Michael, PA-C Charlsie Quest, NP

## 2023-06-10 NOTE — Progress Notes (Signed)
Cardiology Office Note   Date:  06/10/2023   ID:  [name], [name] 03/25/1946, MRN [id number]  PCP:  Buckner Malta, MD  Cardiologist: Dr. Dulce Sellar  Chief Complaint  Patient presents with   Follow-up    3 month f/u c/o fluctuating BP. Meds reviewed verbally with pt.      History of Present Illness: Peggy Jennings is a 77 y.o. female who is here today for follow-up visit regarding resistant hypertension and consideration of renal denervation.   She has known history of essential hypertension, paroxysmal supraventricular tachycardia, sleep apnea on CPAP, obesity with a BMI of 44, hyperlipidemia and GERD.  She had an echocardiogram in January of this year which showed normal LV systolic function with moderate left ventricular hypertrophy.  She reports prolonged history of difficult to control hypertension with poor tolerance to multiple medications.  She has been on clonidine for many years but recently has not been tolerating the medication very well due to excessive fatigue.   Calcium channel blockers were reportedly associated with worsening edema.    She was switched from clonidine to carvedilol.  Renal artery duplex was done and showed no evidence of renal artery stenosis.   She was switched from small dose furosemide to hydrochlorothiazide during last visit.  Her blood pressure improved and she brought her home blood pressure readings with her which were reviewed.  Blood pressure is still not controlled though.  Past Medical History:  Diagnosis Date   Allergic rhinitis 02/21/2016   Arthritis 02/21/2016   Asthma    Asthma 02/21/2016   Blood transfusion    Cystocele 12/19/2015   Depression    Dysrhythmia    Hx of atrial fib. See progress note.    Dysuria 08/04/2017   Edema of both lower extremities 07/06/2018   Fibromyalgia    GERD (gastroesophageal reflux disease)    History of anemia    History of atrial fibrillation    ablation performed in 2007.   History of  bronchitis    HTN (hypertension) 02/07/2012   Hyperlipemia    Hyperlipidemia 02/10/2012   Hypertension    Hypothyroid 02/21/2016   Intermittent constipation 02/21/2016   Knee pain 01/21/2013   Lesion of bladder 12/19/2015   Obesity 07/02/2016   Paroxysmal SVT (supraventricular tachycardia) 08/21/2016   Periprosthetic fracture around internal prosthetic right knee joint 02/07/2012   Recurrent upper respiratory infection (URI)    Right knee pain 01/03/2014   Shortness of breath    SOB (shortness of breath) on exertion 07/06/2018   Status post revision of total knee replacement 02/07/2012   Trochanteric bursitis of left hip 06/11/2018   Urinary incontinence 12/19/2015   Urinary tract infection 2 months ago     Past Surgical History:  Procedure Laterality Date   ABDOMINAL HYSTERECTOMY  1977   ACHILLES TENDON REPAIR     APPENDECTOMY  1960   CARDIAC ELECTROPHYSIOLOGY STUDY AND ABLATION     ESOPHAGOGASTRODUODENOSCOPY     FINGER SURGERY     left index finger, right thumb, cyst removal   FOOT SURGERY Left    Heel spur   JOINT REPLACEMENT  2-3 years   Right Hip   JOINT REPLACEMENT  3-4 years ago   Left Hip   JOINT REPLACEMENT  4 years ago    Right Knee   JOINT REPLACEMENT  10 years ago    Left Knee   JOINT REPLACEMENT  01/20/2012   Right knee   NECK SURGERY  ORIF FEMUR FRACTURE  01/2012   ORIF FEMUR FRACTURE  02/06/2012   Procedure: OPEN REDUCTION INTERNAL FIXATION (ORIF) DISTAL FEMUR FRACTURE;  Surgeon: Budd Palmer, MD;  Location: [facility] OR;  Service: Orthopedics;  Laterality: Right;   PLANTAR FASCIA SURGERY  7-8 Years    Right Foot   TOTAL KNEE REVISION  01/20/2012   Procedure: TOTAL KNEE REVISION;  Surgeon: Raymon Mutton, MD;  Location: [facility] OR;  Service: Orthopedics;  Laterality: Right;     Current Outpatient Medications  Medication Sig Dispense Refill   albuterol (PROAIR HFA) 108 (90 Base) MCG/ACT inhaler Inhale 1 puff into the lungs every 6 (six) hours as needed.      buPROPion  (WELLBUTRIN XL) 300 MG 24 hr tablet Take 300 mg by mouth daily.     CALCIUM PO Take 1 tablet by mouth daily.      carvedilol (COREG) 6.25 MG tablet TAKE 1 TABLET BY MOUTH TWICE DAILY. 180 tablet 0   Cholecalciferol (VITAMIN D3) 400 units CAPS Take 400 Units by mouth daily.     ciclopirox (PENLAC) 8 % solution Apply topically at bedtime. Apply over nail and surrounding skin. Apply daily over previous coat. After seven (7) days, may remove with alcohol and continue cycle. 6.6 mL 0   cyanocobalamin 100 MCG tablet Take 100 mcg by mouth daily.     hydrALAZINE (APRESOLINE) 50 MG tablet Take 1 tablet (50 mg total) by mouth 2 (two) times daily. 180 tablet 3   hydrochlorothiazide (HYDRODIURIL) 25 MG tablet TAKE 1 TABLET BY MOUTH DAILY **STOP THE FUROSEMIDE** 90 tablet 3   levocetirizine (XYZAL) 5 MG tablet Take 5 mg by mouth daily.     levothyroxine (SYNTHROID) 112 MCG tablet Take 112 mcg by mouth daily before breakfast.     meloxicam (MOBIC) 15 MG tablet Take 15 mg by mouth daily.     montelukast (SINGULAIR) 10 MG tablet Take 10 mg by mouth at bedtime.     Multiple Vitamin (MULTI-VITAMINS) TABS Take 1 tablet by mouth daily.      MYRBETRIQ 50 MG TB24 tablet Take 50 mg by mouth daily.     pantoprazole (PROTONIX) 40 MG tablet Take 40 mg by mouth 2 (two) times daily.     telmisartan (MICARDIS) 80 MG tablet Take 80 mg by mouth daily.      No current facility-administered medications for this visit.    Allergies:   Ace inhibitors, Atorvastatin, Oxycodone-acetaminophen, Percocet [oxycodone-acetaminophen], and Ramipril    Social History:  The patient  reports that she has never smoked. She has never used smokeless tobacco. She reports that she does not drink alcohol and does not use drugs.   Family History:  The patient's family history includes Hypertension in her daughter, father, and mother; Stroke in her father and mother.    ROS:  Please see the history of present illness.   Otherwise, review of  systems are positive for none.   All other systems are reviewed and negative.    PHYSICAL EXAM: VS:  BP (!) 158/80 (BP Location: Left Arm, Patient Position: Sitting, Cuff Size: Large)   Pulse 64   Ht 5\' 5"  (1.651 m)   Wt 264 lb (119.7 kg)   SpO2 97%   BMI 43.93 kg/m  , BMI Body mass index is 43.93 kg/m. GEN: Well nourished, well developed, in no acute distress  HEENT: normal  Neck: no JVD, carotid bruits, or masses Cardiac: RRR; no murmurs, rubs, or gallops,no edema  Respiratory:  clear to auscultation bilaterally, normal work of breathing GI: soft, nontender, nondistended, + BS MS: no deformity or atrophy  Skin: warm and dry, no rash Neuro:  Strength and sensation are intact Psych: euthymic mood, full affect   EKG:  EKG is ordered today. The ekg ordered today demonstrates normal sinus rhythm with nonspecific T wave changes.   Recent Labs: 12/23/2022: NT-Pro BNP 391 03/20/2023: BUN 23; Creatinine, Ser 0.95; Potassium 3.8; Sodium 137    Lipid Panel    Component Value Date/Time   CHOL 259 (H) 10/06/2017 1002   TRIG 232 (H) 10/06/2017 1002   HDL 51 10/06/2017 1002   CHOLHDL 5.1 (H) 10/06/2017 1002   LDLCALC 162 (H) 10/06/2017 1002      Wt Readings from Last 3 Encounters:  06/10/23 264 lb (119.7 kg)  05/23/23 262 lb 6.4 oz (119 kg)  03/11/23 266 lb 4 oz (120.8 kg)           No data to display            ASSESSMENT AND PLAN:  1.  Resistant hypertension: Her symptoms improved after stopping clonidine.  Blood pressure improved but is still not controlled.  I am going to switch hydralazine to spironolactone 25 mg once daily.  Continue hydrochlorothiazide 25 mg once daily, carvedilol 6.25 mg twice daily and telmisartan 80 mg once daily.  Check basic metabolic profile in 1 week.   I again discussed that indication for renal denervation and explained to her that the majority of patients included in the studies were below 76 years old.  I prefer to keep renal  denervation as a last resort after adjusting her medications.  Renal artery duplex showed no evidence of renal artery stenosis. In addition, I suspect that the obesity and sleep apnea is contributing to her blood pressure.  If blood pressure remains uncontrolled in spite of of adding spironolactone, it would be reasonable to consider renal denervation.  2.  Sleep apnea: She uses CPAP on a regular basis.  3.  Paroxysmal supraventricular tachycardia.  No recent episodes.    Disposition:   Follow-up in 3 months.  Signed,  Lorine Bears, MD  06/10/2023 1:37 PM    [facility]

## 2023-06-11 DIAGNOSIS — Z6841 Body Mass Index (BMI) 40.0 and over, adult: Secondary | ICD-10-CM | POA: Diagnosis not present

## 2023-06-11 DIAGNOSIS — N3000 Acute cystitis without hematuria: Secondary | ICD-10-CM | POA: Diagnosis not present

## 2023-06-16 DIAGNOSIS — G4733 Obstructive sleep apnea (adult) (pediatric): Secondary | ICD-10-CM | POA: Diagnosis not present

## 2023-06-20 DIAGNOSIS — I1A0 Resistant hypertension: Secondary | ICD-10-CM | POA: Diagnosis not present

## 2023-06-20 DIAGNOSIS — E782 Mixed hyperlipidemia: Secondary | ICD-10-CM | POA: Diagnosis not present

## 2023-06-23 DIAGNOSIS — F339 Major depressive disorder, recurrent, unspecified: Secondary | ICD-10-CM | POA: Diagnosis not present

## 2023-06-23 DIAGNOSIS — R413 Other amnesia: Secondary | ICD-10-CM | POA: Diagnosis not present

## 2023-06-23 DIAGNOSIS — R030 Elevated blood-pressure reading, without diagnosis of hypertension: Secondary | ICD-10-CM | POA: Diagnosis not present

## 2023-06-23 DIAGNOSIS — R29818 Other symptoms and signs involving the nervous system: Secondary | ICD-10-CM | POA: Diagnosis not present

## 2023-06-23 DIAGNOSIS — R7309 Other abnormal glucose: Secondary | ICD-10-CM | POA: Diagnosis not present

## 2023-06-23 DIAGNOSIS — Z6841 Body Mass Index (BMI) 40.0 and over, adult: Secondary | ICD-10-CM | POA: Diagnosis not present

## 2023-06-23 DIAGNOSIS — N3000 Acute cystitis without hematuria: Secondary | ICD-10-CM | POA: Diagnosis not present

## 2023-07-03 ENCOUNTER — Other Ambulatory Visit: Payer: Self-pay | Admitting: Cardiovascular Disease

## 2023-07-07 DIAGNOSIS — I6782 Cerebral ischemia: Secondary | ICD-10-CM | POA: Diagnosis not present

## 2023-07-07 DIAGNOSIS — G319 Degenerative disease of nervous system, unspecified: Secondary | ICD-10-CM | POA: Diagnosis not present

## 2023-07-07 DIAGNOSIS — R413 Other amnesia: Secondary | ICD-10-CM | POA: Diagnosis not present

## 2023-07-14 DIAGNOSIS — N3000 Acute cystitis without hematuria: Secondary | ICD-10-CM | POA: Diagnosis not present

## 2023-07-14 DIAGNOSIS — Z6841 Body Mass Index (BMI) 40.0 and over, adult: Secondary | ICD-10-CM | POA: Diagnosis not present

## 2023-07-14 DIAGNOSIS — N390 Urinary tract infection, site not specified: Secondary | ICD-10-CM | POA: Diagnosis not present

## 2023-07-14 DIAGNOSIS — R413 Other amnesia: Secondary | ICD-10-CM | POA: Diagnosis not present

## 2023-08-02 DIAGNOSIS — L821 Other seborrheic keratosis: Secondary | ICD-10-CM | POA: Diagnosis not present

## 2023-08-19 DIAGNOSIS — Z6841 Body Mass Index (BMI) 40.0 and over, adult: Secondary | ICD-10-CM | POA: Diagnosis not present

## 2023-08-19 DIAGNOSIS — N368 Other specified disorders of urethra: Secondary | ICD-10-CM | POA: Diagnosis not present

## 2023-08-19 DIAGNOSIS — Z79899 Other long term (current) drug therapy: Secondary | ICD-10-CM | POA: Diagnosis not present

## 2023-08-19 DIAGNOSIS — N958 Other specified menopausal and perimenopausal disorders: Secondary | ICD-10-CM | POA: Diagnosis not present

## 2023-08-19 DIAGNOSIS — N390 Urinary tract infection, site not specified: Secondary | ICD-10-CM | POA: Diagnosis not present

## 2023-08-19 DIAGNOSIS — E039 Hypothyroidism, unspecified: Secondary | ICD-10-CM | POA: Diagnosis not present

## 2023-08-22 ENCOUNTER — Other Ambulatory Visit: Payer: Self-pay | Admitting: Cardiovascular Disease

## 2023-08-27 DIAGNOSIS — M533 Sacrococcygeal disorders, not elsewhere classified: Secondary | ICD-10-CM | POA: Diagnosis not present

## 2023-08-27 DIAGNOSIS — G8929 Other chronic pain: Secondary | ICD-10-CM | POA: Diagnosis not present

## 2023-09-16 ENCOUNTER — Ambulatory Visit: Payer: PPO | Attending: Cardiovascular Disease | Admitting: Cardiovascular Disease

## 2023-09-16 ENCOUNTER — Encounter: Payer: Self-pay | Admitting: Cardiovascular Disease

## 2023-09-16 VITALS — BP 128/62 | HR 63 | Ht 65.0 in | Wt 268.4 lb

## 2023-09-16 DIAGNOSIS — I1A0 Resistant hypertension: Secondary | ICD-10-CM

## 2023-09-16 DIAGNOSIS — I471 Supraventricular tachycardia, unspecified: Secondary | ICD-10-CM

## 2023-09-16 NOTE — Progress Notes (Signed)
Cardiology Office Note   Date:  09/16/2023   ID:  Peggy, [name] Sep Jennings, 1947, MRN [id number]  PCP:  Buckner Malta, MD  Cardiologist: Dr. Dulce Sellar  Chief Complaint  Patient presents with   Follow-up    3 month f/u no complaints today. Meds reviewed verbally with pt.      History of Present Illness: Peggy Jennings is a 77 y.o. female who is here today for follow-up visit regarding resistant hypertension . She has known history of essential hypertension, paroxysmal supraventricular tachycardia, sleep apnea on CPAP, obesity with a BMI of 44, hyperlipidemia and GERD.  She had an echocardiogram in January of this year which showed normal LV systolic function with moderate left ventricular hypertrophy.  She has known history of resistant hypertension with poor tolerance to multiple medications.  Clonidine was associated with fatigue.  Calcium channel blockers were reportedly associated with worsening edema.    Renal artery duplex was done and showed no evidence of renal artery stenosis.    She has been doing very well with significant improvement in blood pressure since she was switched to hydrochlorothiazide and spironolactone was added.  She is feeling well overall with no chest pain or shortness of breath.  Past Medical History:  Diagnosis Date   Allergic rhinitis 02/21/2016   Arthritis 02/21/2016   Asthma    Asthma 02/21/2016   Blood transfusion    Cystocele 12/19/2015   Depression    Dysrhythmia    Hx of atrial fib. See progress note.    Dysuria 08/04/2017   Edema of both lower extremities 07/06/2018   Fibromyalgia    GERD (gastroesophageal reflux disease)    History of anemia    History of atrial fibrillation    ablation performed in 2007.   History of bronchitis    HTN (hypertension) 02/07/2012   Hyperlipemia    Hyperlipidemia 02/10/2012   Hypertension    Hypothyroid 02/21/2016   Intermittent constipation 02/21/2016   Knee pain 01/21/2013   Lesion of bladder  12/19/2015   Obesity 07/02/2016   Paroxysmal SVT (supraventricular tachycardia) (HCC) 08/21/2016   Periprosthetic fracture around internal prosthetic right knee joint 02/07/2012   Recurrent upper respiratory infection (URI)    Right knee pain 01/03/2014   Shortness of breath    SOB (shortness of breath) on exertion 07/06/2018   Status post revision of total knee replacement 02/07/2012   Trochanteric bursitis of left hip 06/11/2018   Urinary incontinence 12/19/2015   Urinary tract infection 2 months ago     Past Surgical History:  Procedure Laterality Date   ABDOMINAL HYSTERECTOMY  1977   ACHILLES TENDON REPAIR     APPENDECTOMY  1960   CARDIAC ELECTROPHYSIOLOGY STUDY AND ABLATION     ESOPHAGOGASTRODUODENOSCOPY     FINGER SURGERY     left index finger, right thumb, cyst removal   FOOT SURGERY Left    Heel spur   JOINT REPLACEMENT  2-3 years   Right Hip   JOINT REPLACEMENT  3-4 years ago   Left Hip   JOINT REPLACEMENT  4 years ago    Right Knee   JOINT REPLACEMENT  10 years ago    Left Knee   JOINT REPLACEMENT  01/20/2012   Right knee   NECK SURGERY     ORIF FEMUR FRACTURE  01/2012   ORIF FEMUR FRACTURE  02/06/2012   Procedure: OPEN REDUCTION INTERNAL FIXATION (ORIF) DISTAL FEMUR FRACTURE;  Surgeon: Budd Palmer, MD;  Location: [facility] OR;  Service: Orthopedics;  Laterality: Right;   PLANTAR FASCIA SURGERY  7-8 Years    Right Foot   TOTAL KNEE REVISION  01/20/2012   Procedure: TOTAL KNEE REVISION;  Surgeon: Raymon Mutton, MD;  Location: [facility] OR;  Service: Orthopedics;  Laterality: Right;     Current Outpatient Medications  Medication Sig Dispense Refill   albuterol (PROAIR HFA) 108 (90 Base) MCG/ACT inhaler Inhale 1 puff into the lungs every 6 (six) hours as needed.      buPROPion (WELLBUTRIN XL) 300 MG 24 hr tablet Take 300 mg by mouth daily.     CALCIUM PO Take 1 tablet by mouth daily.      carvedilol (COREG) 6.25 MG tablet TAKE ONE TABLET BY MOUTH TWICE DAILY 180 tablet 0    Cholecalciferol (VITAMIN D3) 400 units CAPS Take 400 Units by mouth daily.     ciclopirox (PENLAC) 8 % solution Apply topically at bedtime. Apply over nail and surrounding skin. Apply daily over previous coat. After seven (7) days, may remove with alcohol and continue cycle. 6.6 mL 0   cyanocobalamin 100 MCG tablet Take 100 mcg by mouth daily.     hydrochlorothiazide (HYDRODIURIL) 25 MG tablet TAKE 1 TABLET BY MOUTH DAILY **STOP THE FUROSEMIDE** 90 tablet 3   levocetirizine (XYZAL) 5 MG tablet Take 5 mg by mouth daily.     levothyroxine (SYNTHROID) 112 MCG tablet Take 112 mcg by mouth daily before breakfast.     meloxicam (MOBIC) 15 MG tablet Take 15 mg by mouth daily.     montelukast (SINGULAIR) 10 MG tablet Take 10 mg by mouth at bedtime.     Multiple Vitamin (MULTI-VITAMINS) TABS Take 1 tablet by mouth daily.      MYRBETRIQ 50 MG TB24 tablet Take 50 mg by mouth daily.     pantoprazole (PROTONIX) 40 MG tablet Take 40 mg by mouth 2 (two) times daily.     spironolactone (ALDACTONE) 25 MG tablet TAKE 1 TABLET BY MOUTH ONCE DAILY. 90 tablet 0   telmisartan (MICARDIS) 80 MG tablet Take 80 mg by mouth daily.      No current facility-administered medications for this visit.    Allergies:   Ace inhibitors, Atorvastatin, Oxycodone-acetaminophen, Percocet [oxycodone-acetaminophen], and Ramipril    Social History:  The patient  reports that she has never smoked. She has never used smokeless tobacco. She reports that she does not drink alcohol and does not use drugs.   Family History:  The patient's family history includes Hypertension in her daughter, father, and mother; Stroke in her father and mother.    ROS:  Please see the history of present illness.   Otherwise, review of systems are positive for none.   All other systems are reviewed and negative.    PHYSICAL EXAM: VS:  BP 128/62 (BP Location: Right Arm, Patient Position: Sitting, Cuff Size: Large)   Pulse 63   Ht 5\' 5"  (1.651 m)   Wt  268 lb 6 oz (121.7 kg)   SpO2 94%   BMI 44.66 kg/m  , BMI Body mass index is 44.66 kg/m. GEN: Well nourished, well developed, in no acute distress  HEENT: normal  Neck: no JVD, carotid bruits, or masses Cardiac: RRR; no murmurs, rubs, or gallops,no edema  Respiratory:  clear to auscultation bilaterally, normal work of breathing GI: soft, nontender, nondistended, + BS MS: no deformity or atrophy  Skin: warm and dry, no rash Neuro:  Strength and sensation are intact Psych: euthymic mood, full affect  EKG:  EKG is ordered today. The ekg ordered today demonstrates : Sinus rhythm with Premature atrial complexes with Abberant conduction When compared with ECG of 10-Jun-2023 13:36, Abberant conduction is now Present    Recent Labs: 12/23/2022: NT-Pro BNP 391 06/20/2023: BUN 19; Creatinine, Ser 0.94; Potassium 4.7; Sodium 141    Lipid Panel    Component Value Date/Time   CHOL 259 (H) 10/06/2017 1002   TRIG 232 (H) 10/06/2017 1002   HDL 51 10/06/2017 1002   CHOLHDL 5.1 (H) 10/06/2017 1002   LDLCALC 162 (H) 10/06/2017 1002      Wt Readings from Last 3 Encounters:  09/16/23 268 lb 6 oz (121.7 kg)  06/10/23 264 lb (119.7 kg)  05/23/23 262 lb 6.4 oz (119 kg)           No data to display            ASSESSMENT AND PLAN:  1.  Resistant hypertension: Blood pressure is now well-controlled on current medications including carvedilol, spironolactone, hydrochlorothiazide and telmisartan.    2.  Sleep apnea: She uses CPAP on a regular basis.  3.  Paroxysmal supraventricular tachycardia.  No recent episodes.  She does have PVCs on her EKG today but she is overall asymptomatic.  Her EF is known to be normal.  Continue to monitor for now.    Disposition:   Follow-up in 6 months.  Signed,  Lorine Bears, MD  09/16/2023 2:11 PM    [facility]

## 2023-09-16 NOTE — Patient Instructions (Signed)
Medication Instructions:  No changes *If you need a refill on your cardiac medications before your next appointment, please call your pharmacy*   Lab Work: None ordered If you have labs (blood work) drawn today and your tests are completely normal, you will receive your results only by: MyChart Message (if you have MyChart) OR A paper copy in the mail If you have any lab test that is abnormal or we need to change your treatment, we will call you to review the results.   Testing/Procedures: None ordered   Follow-Up: At [facility], you and your health needs are our priority.  As part of our continuing mission to provide you with exceptional heart care, we have created designated Provider Care Teams.  These Care Teams include your primary Cardiologist (physician) and Advanced Practice Providers (APPs -  Physician Assistants and Nurse Practitioners) who all work together to provide you with the care you need, when you need it.  We recommend signing up for the patient portal called "MyChart".  Sign up information is provided on this After Visit Summary.  MyChart is used to connect with patients for Virtual Visits (Telemedicine).  Patients are able to view lab/test results, encounter notes, upcoming appointments, etc.  Non-urgent messages can be sent to your provider as well.   To learn more about what you can do with MyChart, go to [email or web address].    Your next appointment:   6 month(s)  Provider:   Dr. Arida  

## 2023-09-18 DIAGNOSIS — M7918 Myalgia, other site: Secondary | ICD-10-CM | POA: Diagnosis not present

## 2023-09-18 DIAGNOSIS — G8929 Other chronic pain: Secondary | ICD-10-CM | POA: Diagnosis not present

## 2023-09-18 DIAGNOSIS — M533 Sacrococcygeal disorders, not elsewhere classified: Secondary | ICD-10-CM | POA: Diagnosis not present

## 2023-09-22 ENCOUNTER — Other Ambulatory Visit: Payer: Self-pay | Admitting: Cardiovascular Disease

## 2023-09-30 DIAGNOSIS — Z6841 Body Mass Index (BMI) 40.0 and over, adult: Secondary | ICD-10-CM | POA: Diagnosis not present

## 2023-09-30 DIAGNOSIS — R413 Other amnesia: Secondary | ICD-10-CM | POA: Diagnosis not present

## 2023-09-30 DIAGNOSIS — N3281 Overactive bladder: Secondary | ICD-10-CM | POA: Diagnosis not present

## 2023-09-30 DIAGNOSIS — R42 Dizziness and giddiness: Secondary | ICD-10-CM | POA: Diagnosis not present

## 2023-10-03 DIAGNOSIS — Z23 Encounter for immunization: Secondary | ICD-10-CM | POA: Diagnosis not present

## 2023-10-04 DIAGNOSIS — G4733 Obstructive sleep apnea (adult) (pediatric): Secondary | ICD-10-CM | POA: Diagnosis not present

## 2023-10-20 DIAGNOSIS — Z79899 Other long term (current) drug therapy: Secondary | ICD-10-CM | POA: Diagnosis not present

## 2023-10-20 DIAGNOSIS — M47816 Spondylosis without myelopathy or radiculopathy, lumbar region: Secondary | ICD-10-CM | POA: Diagnosis not present

## 2023-10-20 DIAGNOSIS — G8929 Other chronic pain: Secondary | ICD-10-CM | POA: Diagnosis not present

## 2023-10-20 DIAGNOSIS — M545 Low back pain, unspecified: Secondary | ICD-10-CM | POA: Diagnosis not present

## 2023-11-13 DIAGNOSIS — G8929 Other chronic pain: Secondary | ICD-10-CM | POA: Diagnosis not present

## 2023-11-13 DIAGNOSIS — M47816 Spondylosis without myelopathy or radiculopathy, lumbar region: Secondary | ICD-10-CM | POA: Diagnosis not present

## 2023-11-27 ENCOUNTER — Other Ambulatory Visit: Payer: Self-pay | Admitting: Cardiovascular Disease

## 2023-11-27 ENCOUNTER — Encounter: Payer: Self-pay | Admitting: Cardiology

## 2023-11-27 NOTE — Progress Notes (Signed)
 Cardiology Office Note:    Date:  11/28/2023   ID:  Peggy Jennings, DOB 1945/12/28, MRN [id number]  PCP:  Clemmie Nest, MD  Cardiologist:  Redell Leiter, MD    Referring MD: Clemmie Nest, MD    ASSESSMENT:    1. Paroxysmal SVT (supraventricular tachycardia) (HCC)   2. Resistant hypertension   3. Mixed hyperlipidemia    PLAN:    In order of problems listed above:  Marked improvement in her previous very resistant hypertension for now continue her current antihypertensive medications and I think pain relief modalities would be very helpful. No recurrence of SVT after ablation she will continue her beta-blocker Currently not on lipid-lowering therapy   Next appointment: 6 months   Medication Adjustments/Labs and Tests Ordered: Current medicines are reviewed at length with the patient today.  Concerns regarding medicines are outlined above.  No orders of the defined types were placed in this encounter.  No orders of the defined types were placed in this encounter.    History of Present Illness:    Peggy Jennings is a 78 y.o. female with a hx of resistant hypertension and a history of SVT hypertension and hyper lipidemia last seen 05/23/2023.  Compliance with diet, lifestyle and medications: Yes  Her primary problem is back pain and she is having pain relief modalities performed and is improved Blood pressure is much better in the morning she runs from 119/84 to 159/88 afternoon 123/79 to 175/93. Her peripheral edema has cleared no shortness of breath chest pain palpitation or syncope Current antihypertensives include hydrochlorothiazide  spironolactone  carvedilol  and telmisartan. Recent labs 08/19/2023 creatinine 1.1 potassium 4.7 last lipid profile December 2023 offered to draw today and she declined said she will have it done at her PCP office Past Medical History:  Diagnosis Date   Acquired hallux rigidus, left 09/25/2021   Allergic rhinitis 02/21/2016    Arthritis 02/21/2016   Asthma    Asthma 02/21/2016   Blood transfusion    Cervical radiculopathy 11/03/2018   Cystocele 12/19/2015   Depression    Dysrhythmia    Hx of atrial fib. See progress note.    Dysuria 08/04/2017   Edema of both lower extremities 07/06/2018   Fibromyalgia    Fissure in skin of both feet 09/25/2021   Fracture of bone adjacent to prosthesis 02/07/2012   GERD (gastroesophageal reflux disease)    History of anemia    History of atrial fibrillation    ablation performed in 2007.   History of bronchitis    HTN (hypertension) 02/07/2012   Hyperlipemia    Hyperlipidemia 02/10/2012   Hypertension    Hypothyroid 02/21/2016   Intermittent constipation 02/21/2016   Knee pain 01/21/2013   Lesion of bladder 12/19/2015   Lumbar spondylosis 12/09/2019   Obesity 07/02/2016   Paroxysmal SVT (supraventricular tachycardia) (HCC) 08/21/2016   Periprosthetic fracture around internal prosthetic right knee joint 02/07/2012   Postoperative follow-up 01/15/2019   Recurrent upper respiratory infection (URI)    Right knee pain 01/03/2014   Sacroiliac joint pain 11/16/2020   Shortness of breath    SOB (shortness of breath) on exertion 07/06/2018   Status post revision of total knee replacement 02/07/2012   Toenail deformity 09/25/2021   Transitional vertebra 10/04/2021   Trochanteric bursitis of left hip 06/11/2018   Urinary incontinence 12/19/2015   Urinary tract infection 2 months ago     Current Medications: Current Meds  Medication Sig   albuterol  (PROAIR  HFA) 108 (90 Base) MCG/ACT inhaler Inhale  1 puff into the lungs every 6 (six) hours as needed.    buPROPion (WELLBUTRIN XL) 300 MG 24 hr tablet Take 300 mg by mouth daily.   CALCIUM  PO Take 1 tablet by mouth daily.    carvedilol  (COREG ) 6.25 MG tablet TAKE ONE TABLET BY MOUTH TWICE DAILY   Cholecalciferol  (VITAMIN D3) 400 units CAPS Take 400 Units by mouth daily.   ciclopirox  (PENLAC ) 8 % solution Apply topically  at bedtime. Apply over nail and surrounding skin. Apply daily over previous coat. After seven (7) days, may remove with alcohol and continue cycle.   cyanocobalamin 100 MCG tablet Take 100 mcg by mouth daily.   hydrochlorothiazide  (HYDRODIURIL ) 25 MG tablet TAKE 1 TABLET BY MOUTH DAILY **STOP THE FUROSEMIDE **   levocetirizine (XYZAL) 5 MG tablet Take 5 mg by mouth daily.   levothyroxine (SYNTHROID) 112 MCG tablet Take 112 mcg by mouth daily before breakfast.   meloxicam  (MOBIC ) 15 MG tablet Take 15 mg by mouth daily.   montelukast (SINGULAIR) 10 MG tablet Take 10 mg by mouth at bedtime.   Multiple Vitamin (MULTI-VITAMINS) TABS Take 1 tablet by mouth daily.    MYRBETRIQ 50 MG TB24 tablet Take 50 mg by mouth daily.   pantoprazole  (PROTONIX ) 40 MG tablet Take 40 mg by mouth 2 (two) times daily.   spironolactone  (ALDACTONE ) 25 MG tablet TAKE ONE TABLET BY MOUTH EVERY DAY   telmisartan (MICARDIS) 80 MG tablet Take 80 mg by mouth daily.       EKGs/Labs/Other Studies Reviewed:    The following studies were reviewed today:  Cardiac Studies & Procedures      ECHOCARDIOGRAM  ECHOCARDIOGRAM COMPLETE 12/23/2022  Narrative ECHOCARDIOGRAM REPORT    Patient Name:   Peggy Jennings Date of Exam: 12/23/2022 Medical Rec #:  [id number]        Height:       66.0 in Accession #:    [id number]       Weight:       263.0 lb Date of Birth:  1946/03/15        BSA:          2.247 m Patient Age:    76 years         BP:           151/82 mmHg Patient Gender: F                HR:           56 bpm. Exam Location:  [place]  Procedure: 2D Echo, Cardiac Doppler, Color Doppler and Strain Analysis  Indications:    SOB (shortness of breath) on exertion [R06.02 (ICD-10-CM)]  History:        Patient has prior history of Echocardiogram examinations, most recent 06/21/2019. Arrythmias:Paroxysmal SVT (supraventricular tachycardia); Risk Factors:Hypertension.  Sonographer:    Lynwood Silvas RDCS Referring Phys: [id number]  Sharief Wainwright J Michille Mcelrath  IMPRESSIONS   1. Left ventricular ejection fraction, by estimation, is 55 to 60%. The left ventricle has normal function. The left ventricle has no regional wall motion abnormalities. There is moderate concentric left ventricular hypertrophy. Left ventricular diastolic parameters are consistent with Grade I diastolic dysfunction (impaired relaxation). Elevated left ventricular end-diastolic pressure. 2. Right ventricular systolic function is normal. The right ventricular size is normal. There is normal pulmonary artery systolic pressure. 3. The mitral valve is normal in structure. Trivial mitral valve regurgitation. No evidence of mitral stenosis. 4. The aortic valve is normal in structure. Aortic valve regurgitation  is mild. Aortic valve sclerosis is present, with no evidence of aortic valve stenosis. 5. Aortic Normal DTA. 6. The inferior vena cava is dilated in size with >50% respiratory variability, suggesting right atrial pressure of 8 mmHg.  FINDINGS Left Ventricle: Left ventricular ejection fraction, by estimation, is 55 to 60%. The left ventricle has normal function. The left ventricle has no regional wall motion abnormalities. Global longitudinal strain performed but not reported based on interpreter judgement due to suboptimal tracking. The left ventricular internal cavity size was normal in size. There is moderate concentric left ventricular hypertrophy. Left ventricular diastolic parameters are consistent with Grade I diastolic dysfunction (impaired relaxation). Elevated left ventricular end-diastolic pressure.  Right Ventricle: The right ventricular size is normal. No increase in right ventricular wall thickness. Right ventricular systolic function is normal. There is normal pulmonary artery systolic pressure. The tricuspid regurgitant velocity is 1.59 m/s, and with an assumed right atrial pressure of 8 mmHg, the estimated right ventricular systolic pressure is 18.1  mmHg.  Left Atrium: Left atrial size was normal in size.  Right Atrium: Right atrial size was normal in size.  Pericardium: There is no evidence of pericardial effusion.  Mitral Valve: The mitral valve is normal in structure. Trivial mitral valve regurgitation. No evidence of mitral valve stenosis.  Tricuspid Valve: The tricuspid valve is normal in structure. Tricuspid valve regurgitation is mild . No evidence of tricuspid stenosis.  Aortic Valve: The aortic valve is normal in structure. Aortic valve regurgitation is mild. Aortic regurgitation PHT measures 599 msec. Aortic valve sclerosis is present, with no evidence of aortic valve stenosis.  Pulmonic Valve: The pulmonic valve was normal in structure. Pulmonic valve regurgitation is not visualized. No evidence of pulmonic stenosis.  Aorta: The aortic arch was not well visualized, the aortic root and ascending aorta are structurally normal, with no evidence of dilitation and Normal DTA.  Venous: The inferior vena cava is dilated in size with greater than 50% respiratory variability, suggesting right atrial pressure of 8 mmHg.  IAS/Shunts: No atrial level shunt detected by color flow Doppler.   LEFT VENTRICLE PLAX 2D LVIDd:         4.60 cm   Diastology LVIDs:         3.40 cm   LV e' medial:    4.06 cm/s LV PW:         1.40 cm   LV E/e' medial:  21.9 LV IVS:        1.40 cm   LV e' lateral:   4.00 cm/s LVOT diam:     2.10 cm   LV E/e' lateral: 22.3 LV SV:         96 LV SV Index:   43 LVOT Area:     3.46 cm   RIGHT VENTRICLE             IVC RV S prime:     10.70 cm/s  IVC diam: 2.10 cm TAPSE (M-mode): 2.6 cm  LEFT ATRIUM             Index        RIGHT ATRIUM           Index LA diam:        3.70 cm 1.65 cm/m   RA Area:     17.30 cm LA Vol (A2C):   78.5 ml 34.94 ml/m  RA Volume:   46.20 ml  20.56 ml/m LA Vol (A4C):   63.0 ml 28.04 ml/m LA Biplane  Vol: 70.7 ml 31.47 ml/m AORTIC VALVE LVOT Vmax:   96.05 cm/s LVOT Vmean:   68.100 cm/s LVOT VTI:    0.278 m AI PHT:      599 msec  AORTA Ao Root diam: 3.20 cm Ao Asc diam:  3.50 cm Ao Desc diam: 2.50 cm  MITRAL VALVE               TRICUSPID VALVE MV Area (PHT): 3.77 cm    TR Peak grad:   10.1 mmHg MV Decel Time: 201 msec    TR Vmax:        159.00 cm/s MV E velocity: 89.10 cm/s MV A velocity: 96.00 cm/s  SHUNTS MV E/A ratio:  0.93        Systemic VTI:  0.28 m Systemic Diam: 2.10 cm  Redell Leiter MD Electronically signed by Redell Leiter MD Signature Date/Time: 12/23/2022/5:09:36 PM    Final                 Recent Labs: 12/23/2022: NT-Pro BNP 391 06/20/2023: BUN 19; Creatinine, Ser 0.94; Potassium 4.7; Sodium 141  Recent Lipid Panel    Component Value Date/Time   CHOL 259 (H) 10/06/2017 1002   TRIG 232 (H) 10/06/2017 1002   HDL 51 10/06/2017 1002   CHOLHDL 5.1 (H) 10/06/2017 1002   LDLCALC 162 (H) 10/06/2017 1002    Physical Exam:    VS:  BP (!) 168/54   Pulse 69   Ht 5' 5 (1.651 m)   Wt 266 lb 3.2 oz (120.7 kg)   SpO2 96%   BMI 44.30 kg/m     Wt Readings from Last 3 Encounters:  11/28/23 266 lb 3.2 oz (120.7 kg)  09/16/23 268 lb 6 oz (121.7 kg)  06/10/23 264 lb (119.7 kg)     GEN:  Well nourished, well developed in no acute distress HEENT: Normal NECK: No JVD; No carotid bruits LYMPHATICS: No lymphadenopathy CARDIAC: RRR, no murmurs, rubs, gallops RESPIRATORY:  Clear to auscultation without rales, wheezing or rhonchi  ABDOMEN: Soft, non-tender, non-distended MUSCULOSKELETAL:  No edema; No deformity  SKIN: Warm and dry NEUROLOGIC:  Alert and oriented x 3 PSYCHIATRIC:  Normal affect    Signed, Redell Leiter, MD  11/28/2023 1:38 PM    [facility]

## 2023-11-28 ENCOUNTER — Ambulatory Visit: Payer: PPO | Attending: Cardiology | Admitting: Cardiology

## 2023-11-28 ENCOUNTER — Encounter: Payer: Self-pay | Admitting: Cardiology

## 2023-11-28 VITALS — BP 168/54 | HR 69 | Ht 65.0 in | Wt 266.2 lb

## 2023-11-28 DIAGNOSIS — I1A0 Resistant hypertension: Secondary | ICD-10-CM

## 2023-11-28 DIAGNOSIS — E782 Mixed hyperlipidemia: Secondary | ICD-10-CM

## 2023-11-28 DIAGNOSIS — I471 Supraventricular tachycardia, unspecified: Secondary | ICD-10-CM

## 2023-11-28 NOTE — Patient Instructions (Signed)

## 2023-12-15 DIAGNOSIS — Z79899 Other long term (current) drug therapy: Secondary | ICD-10-CM | POA: Diagnosis not present

## 2023-12-15 DIAGNOSIS — E039 Hypothyroidism, unspecified: Secondary | ICD-10-CM | POA: Diagnosis not present

## 2023-12-15 DIAGNOSIS — N1831 Chronic kidney disease, stage 3a: Secondary | ICD-10-CM | POA: Diagnosis not present

## 2023-12-15 DIAGNOSIS — F339 Major depressive disorder, recurrent, unspecified: Secondary | ICD-10-CM | POA: Diagnosis not present

## 2023-12-15 DIAGNOSIS — Z1339 Encounter for screening examination for other mental health and behavioral disorders: Secondary | ICD-10-CM | POA: Diagnosis not present

## 2023-12-15 DIAGNOSIS — Z6841 Body Mass Index (BMI) 40.0 and over, adult: Secondary | ICD-10-CM | POA: Diagnosis not present

## 2023-12-15 DIAGNOSIS — R7302 Impaired glucose tolerance (oral): Secondary | ICD-10-CM | POA: Diagnosis not present

## 2023-12-15 DIAGNOSIS — E785 Hyperlipidemia, unspecified: Secondary | ICD-10-CM | POA: Diagnosis not present

## 2023-12-15 DIAGNOSIS — Z1331 Encounter for screening for depression: Secondary | ICD-10-CM | POA: Diagnosis not present

## 2023-12-15 DIAGNOSIS — I10 Essential (primary) hypertension: Secondary | ICD-10-CM | POA: Diagnosis not present

## 2023-12-15 DIAGNOSIS — Z0000 Encounter for general adult medical examination without abnormal findings: Secondary | ICD-10-CM | POA: Diagnosis not present

## 2023-12-18 DIAGNOSIS — N1831 Chronic kidney disease, stage 3a: Secondary | ICD-10-CM | POA: Diagnosis not present

## 2023-12-25 DIAGNOSIS — G8929 Other chronic pain: Secondary | ICD-10-CM | POA: Diagnosis not present

## 2023-12-25 DIAGNOSIS — M47816 Spondylosis without myelopathy or radiculopathy, lumbar region: Secondary | ICD-10-CM | POA: Diagnosis not present

## 2024-01-13 DIAGNOSIS — G4733 Obstructive sleep apnea (adult) (pediatric): Secondary | ICD-10-CM | POA: Diagnosis not present

## 2024-01-19 DIAGNOSIS — M47816 Spondylosis without myelopathy or radiculopathy, lumbar region: Secondary | ICD-10-CM | POA: Diagnosis not present

## 2024-01-19 DIAGNOSIS — M47815 Spondylosis without myelopathy or radiculopathy, thoracolumbar region: Secondary | ICD-10-CM | POA: Diagnosis not present

## 2024-01-19 DIAGNOSIS — G8929 Other chronic pain: Secondary | ICD-10-CM | POA: Diagnosis not present

## 2024-02-12 DIAGNOSIS — G8929 Other chronic pain: Secondary | ICD-10-CM | POA: Diagnosis not present

## 2024-02-12 DIAGNOSIS — M47816 Spondylosis without myelopathy or radiculopathy, lumbar region: Secondary | ICD-10-CM | POA: Diagnosis not present

## 2024-02-12 DIAGNOSIS — M47815 Spondylosis without myelopathy or radiculopathy, thoracolumbar region: Secondary | ICD-10-CM | POA: Diagnosis not present

## 2024-03-04 DIAGNOSIS — M2022 Hallux rigidus, left foot: Secondary | ICD-10-CM | POA: Diagnosis not present

## 2024-03-04 DIAGNOSIS — B351 Tinea unguium: Secondary | ICD-10-CM | POA: Diagnosis not present

## 2024-03-04 DIAGNOSIS — M79675 Pain in left toe(s): Secondary | ICD-10-CM | POA: Diagnosis not present

## 2024-03-04 DIAGNOSIS — M2042 Other hammer toe(s) (acquired), left foot: Secondary | ICD-10-CM | POA: Diagnosis not present

## 2024-03-04 DIAGNOSIS — M79674 Pain in right toe(s): Secondary | ICD-10-CM | POA: Diagnosis not present

## 2024-03-16 ENCOUNTER — Ambulatory Visit: Payer: PPO | Admitting: Cardiovascular Disease

## 2024-03-19 DIAGNOSIS — Z6841 Body Mass Index (BMI) 40.0 and over, adult: Secondary | ICD-10-CM | POA: Diagnosis not present

## 2024-03-19 DIAGNOSIS — K582 Mixed irritable bowel syndrome: Secondary | ICD-10-CM | POA: Diagnosis not present

## 2024-03-19 DIAGNOSIS — I10 Essential (primary) hypertension: Secondary | ICD-10-CM | POA: Diagnosis not present

## 2024-03-19 DIAGNOSIS — E039 Hypothyroidism, unspecified: Secondary | ICD-10-CM | POA: Diagnosis not present

## 2024-03-19 DIAGNOSIS — N1831 Chronic kidney disease, stage 3a: Secondary | ICD-10-CM | POA: Diagnosis not present

## 2024-03-25 ENCOUNTER — Encounter: Payer: Self-pay | Admitting: Nurse Practitioner

## 2024-03-25 ENCOUNTER — Ambulatory Visit: Attending: Nurse Practitioner | Admitting: Nurse Practitioner

## 2024-03-25 VITALS — BP 138/72 | HR 64 | Ht 66.0 in | Wt 269.0 lb

## 2024-03-25 DIAGNOSIS — I10 Essential (primary) hypertension: Secondary | ICD-10-CM | POA: Diagnosis not present

## 2024-03-25 DIAGNOSIS — G4733 Obstructive sleep apnea (adult) (pediatric): Secondary | ICD-10-CM

## 2024-03-25 DIAGNOSIS — I471 Supraventricular tachycardia, unspecified: Secondary | ICD-10-CM | POA: Diagnosis not present

## 2024-03-25 MED ORDER — CARVEDILOL 3.125 MG PO TABS: 3.1250 mg | ORAL_TABLET | Freq: Two times a day (BID) | ORAL | 3 refills | Status: DC

## 2024-03-25 NOTE — Progress Notes (Signed)
 Office Visit    Patient Name: Peggy Jennings Date of Encounter: 03/25/2024  Primary Care Provider:  Harvest Lineman, MD Primary Cardiologist:  Peggy Hinds, MD  Chief Complaint    78 y.o. female with a history of of primary hypertension, PSVT, sleep apnea on CPAP, morbid obesity, hyperlipidemia, and GERD, who presents for follow-up related to hypertension.  Past Medical History  Subjective   Past Medical History:  Diagnosis Date   Acquired hallux rigidus, left 09/25/2021   Allergic rhinitis 02/21/2016   Arthritis 02/21/2016   Asthma    Asthma 02/21/2016   Blood transfusion    Cervical radiculopathy 11/03/2018   Cystocele 12/19/2015   Depression    Diastolic dysfunction    a. 05/2019 Echo: EF 55-60%, sev conc LVH, no rwma, nl RV fxn, mild AI; 11/2022 Echo: EF 55-60%, no rwma, mod conc LVH, GrI DD, nl RV fxn, triv MR, mild AI.   Dysuria 08/04/2017   Edema of both lower extremities 07/06/2018   Fibromyalgia    Fissure in skin of both feet 09/25/2021   Fracture of bone adjacent to prosthesis 02/07/2012   GERD (gastroesophageal reflux disease)    History of anemia    History of bronchitis    HTN (hypertension) 02/07/2012   a. 02/2023 Renal duplex: No RAS.   Hyperlipemia    Hyperlipidemia 02/10/2012   Hypertension    Hypothyroid 02/21/2016   Intermittent constipation 02/21/2016   Knee pain 01/21/2013   Lesion of bladder 12/19/2015   Lumbar spondylosis 12/09/2019   Obesity 07/02/2016   Paroxysmal SVT (supraventricular tachycardia) (HCC) 08/21/2016   Periprosthetic fracture around internal prosthetic right knee joint 02/07/2012   Postoperative follow-up 01/15/2019   PSVT (paroxysmal supraventricular tachycardia) (HCC)    a. 2007 s/p RFCA.   Recurrent upper respiratory infection (URI)    Right knee pain 01/03/2014   Sacroiliac joint pain 11/16/2020   Shortness of breath    SOB (shortness of breath) on exertion 07/06/2018   Status post revision of total knee  replacement 02/07/2012   Toenail deformity 09/25/2021   Transitional vertebra 10/04/2021   Trochanteric bursitis of left hip 06/11/2018   Urinary incontinence 12/19/2015   Urinary tract infection 2 months ago    Past Surgical History:  Procedure Laterality Date   ABDOMINAL HYSTERECTOMY  1977   ACHILLES TENDON REPAIR     APPENDECTOMY  1960   CARDIAC ELECTROPHYSIOLOGY STUDY AND ABLATION     ESOPHAGOGASTRODUODENOSCOPY     FINGER SURGERY     left index finger, right thumb, cyst removal   FOOT SURGERY Left    Heel spur   JOINT REPLACEMENT  2-3 years   Right Hip   JOINT REPLACEMENT  3-4 years ago   Left Hip   JOINT REPLACEMENT  4 years ago    Right Knee   JOINT REPLACEMENT  10 years ago    Left Knee   JOINT REPLACEMENT  01/20/2012   Right knee   NECK SURGERY     ORIF FEMUR FRACTURE  01/2012   ORIF FEMUR FRACTURE  02/06/2012   Procedure: OPEN REDUCTION INTERNAL FIXATION (ORIF) DISTAL FEMUR FRACTURE;  Surgeon: Peggy Lagos, MD;  Location: [facility] OR;  Service: Orthopedics;  Laterality: Right;   PLANTAR FASCIA SURGERY  7-8 Years    Right Foot   TOTAL KNEE REVISION  01/20/2012   Procedure: TOTAL KNEE REVISION;  Surgeon: Peggy Hunter, MD;  Location: [facility] OR;  Service: Orthopedics;  Laterality: Right;    Allergies  Allergies  Allergen Reactions   Ace Inhibitors Other (See Comments)    cough   Atorvastatin  Other (See Comments)    "felt bad"   Oxycodone -Acetaminophen  Other (See Comments)    hallucinations unknown   Percocet [Oxycodone -Acetaminophen ] Other (See Comments)    hallucinations   Ramipril  Other (See Comments)    COUGH      History of Present Illness      78 y.o. y/o female with a history of of primary hypertension, PSVT, sleep apnea on CPAP, morbid obesity, hyperlipidemia, and GERD.  She is status post catheter ablation for SVT in 2007.  She is followed closely by Dr. Sandee Jennings and aspirin in the setting of difficult to manage hypertension.  Echo in January 2024 showed  an EF of 55 to 60% with moderate concentric LVH, grade 1 diastolic dysfunction, normal RV function, trivial MR, aortic sclerosis, and mild AI.  She was previously referred to Dr. Alvenia Jennings in [place] for consideration of renal denervation.  Renal artery duplex in April 2024 showed no significant renal artery stenosis.  Following adjustment to medications, namely discontinuing clonidine  which was resulting in rebound hypertension and fatigue, and titrating carvedilol , blood pressure improved.   Peggy Jennings was last seen in cardiology clinic in January 2025, at which time she was doing well on HCTZ, spironolactone , carvedilol , and telmisartan.  Since her last visit, she has done reasonably well.  Blood pressures at home mostly trend around 130.  She says that when her pressures less than 120, she feels weak.  As that was happening fairly frequently in the mornings, she has cut out her morning dose of carvedilol  and is currently only taking 6.25 mg in the p.m.  In that setting, evening pressures are often higher.  She does not experience chest pain and denies dyspnea, palpitations, PND, orthopnea, dizziness, syncope, edema, or early satiety.  She does not routinely exercise due to chronic right leg pain and prior history of knee surgery. Objective  Home Medications    Current Outpatient Medications  Medication Sig Dispense Refill   albuterol  (PROAIR  HFA) 108 (90 Base) MCG/ACT inhaler Inhale 1 puff into the lungs every 6 (six) hours as needed.      buPROPion (WELLBUTRIN XL) 300 MG 24 hr tablet Take 300 mg by mouth daily.     CALCIUM  PO Take 1 tablet by mouth daily.      Cholecalciferol  (VITAMIN D3) 400 units CAPS Take 400 Units by mouth daily.     ciclopirox  (PENLAC ) 8 % solution Apply topically at bedtime. Apply over nail and surrounding skin. Apply daily over previous coat. After seven (7) days, may remove with alcohol and continue cycle. 6.6 mL 0   cyanocobalamin 100 MCG tablet Take 100 mcg by mouth daily.      hydrochlorothiazide  (HYDRODIURIL ) 25 MG tablet TAKE 1 TABLET BY MOUTH DAILY **STOP THE FUROSEMIDE ** 90 tablet 3   levocetirizine (XYZAL) 5 MG tablet Take 5 mg by mouth daily.     levothyroxine (SYNTHROID) 112 MCG tablet Take 112 mcg by mouth daily before breakfast.     meloxicam  (MOBIC ) 15 MG tablet Take 15 mg by mouth daily.     montelukast (SINGULAIR) 10 MG tablet Take 10 mg by mouth at bedtime.     Multiple Vitamin (MULTI-VITAMINS) TABS Take 1 tablet by mouth daily.      MYRBETRIQ 50 MG TB24 tablet Take 50 mg by mouth daily.     pantoprazole  (PROTONIX ) 40 MG tablet Take 40 mg by mouth 2 (two)  times daily.     spironolactone  (ALDACTONE ) 25 MG tablet TAKE ONE TABLET BY MOUTH EVERY DAY 90 tablet 1   telmisartan (MICARDIS) 80 MG tablet Take 80 mg by mouth daily.      carvedilol  (COREG ) 3.125 MG tablet Take 1 tablet (3.125 mg total) by mouth 2 (two) times daily. 60 tablet 3   No current facility-administered medications for this visit.     Physical Exam    VS:  BP 138/72 (BP Location: Left Wrist, Patient Position: Sitting, Cuff Size: Normal)   Pulse 64   Ht 5\' 6"  (1.676 m)   Wt 269 lb (122 kg)   SpO2 97%   BMI 43.42 kg/m  , BMI Body mass index is 43.42 kg/m.       GEN: Well nourished, well developed, in no acute distress. HEENT: normal. Neck: Supple, no JVD, carotid bruits, or masses. Cardiac: RRR, no murmurs, rubs, or gallops. No clubbing, cyanosis, edema.  Radials 2+/PT 2+ and equal bilaterally.  Respiratory:  Respirations regular and unlabored, clear to auscultation bilaterally. GI: Soft, nontender, nondistended, BS + x 4. MS: no deformity or atrophy. Skin: warm and dry, no rash. Neuro:  Strength and sensation are intact. Psych: Normal affect.  Accessory Clinical Findings    ECG personally reviewed by me today - EKG Interpretation Date/Time:  Thursday Mar 25 2024 13:18:13 EDT Ventricular Rate:  64 PR Interval:  144 QRS Duration:  72 QT Interval:  404 QTC  Calculation: 416 R Axis:   11  Text Interpretation: Normal sinus rhythm Nonspecific ST and T wave abnormality Confirmed by Laneta Pintos [phone]) on 03/25/2024 1:21:50 PM  - no acute changes.  Lab Results  Component Value Date   WBC 9.1 02/08/2012   HGB 9.4 (L) 02/08/2012   HCT 28.5 (L) 02/08/2012   MCV 90.5 02/08/2012   PLT 266 02/08/2012   Lab Results  Component Value Date   CREATININE 0.94 06/20/2023   BUN 19 06/20/2023   NA 141 06/20/2023   K 4.7 06/20/2023   CL 104 06/20/2023   CO2 26 06/20/2023   Lab Results  Component Value Date   ALT 13 02/07/2012   AST 22 02/07/2012   ALKPHOS 155 (H) 02/07/2012   BILITOT 0.5 02/07/2012   Lab Results  Component Value Date   CHOL 259 (H) 10/06/2017   HDL 51 10/06/2017   LDLCALC 162 (H) 10/06/2017   TRIG 232 (H) 10/06/2017   CHOLHDL 5.1 (H) 10/06/2017        Assessment & Plan    1.  Primary hypertension: Blood pressure relatively stable at home, frequently trending in the 130s with a range from the 1 teens to the 140s.  She feels weak when pressures are less than 120 and as result, she stopped taking carvedilol  in the morning and has only been taking 6.25 mg at bedtime.  This is resulted in some higher pressures in the evenings.  She agreed to start taking 3.125 mg twice daily to hopefully even out pressures throughout the day.  She will continue to trend blood pressures at home and understands that if trends increase, she is to contact us  as we may need to adjust doses further.  She otherwise remains on HCTZ, telmisartan, and spironolactone .  She has lab work through her primary care provider's office and understands that she should have a basic metabolic panel at least once every 6 months.  2.  PSVT: Status post catheter ablation in 2007.  Quiescent on carvedilol .  3.  Obstructive sleep apnea: Reports compliance with CPAP.  4.  Disposition: Patient will follow-up in our [place] office in 6 months or sooner if  necessary.  Laneta Pintos, NP 03/25/2024, 3:43 PM

## 2024-03-25 NOTE — Patient Instructions (Signed)
 Medication Instructions:  Your physician recommends the following medication changes.  DECREASE: Coreg  to 3.125 mg daily  *If you need a refill on your cardiac medications before your next appointment, please call your pharmacy*  Lab Work: No labs ordered today  If you have labs (blood work) drawn today and your tests are completely normal, you will receive your results only by: MyChart Message (if you have MyChart) OR A paper copy in the mail If you have any lab test that is abnormal or we need to change your treatment, we will call you to review the results.  Testing/Procedures: No test ordered today   Follow-Up: At [facility], you and your health needs are our priority.  As part of our continuing mission to provide you with exceptional heart care, our providers are all part of one team.  This team includes your primary Cardiologist (physician) and Advanced Practice Providers or APPs (Physician Assistants and Nurse Practitioners) who all work together to provide you with the care you need, when you need it.  Your next appointment:   Follow up with Dr. Sandee Crook in [place]    We recommend signing up for the patient portal called "MyChart".  Sign up information is provided on this After Visit Summary.  MyChart is used to connect with patients for Virtual Visits (Telemedicine).  Patients are able to view lab/test results, encounter notes, upcoming appointments, etc.  Non-urgent messages can be sent to your provider as well.   To learn more about what you can do with MyChart, go to [email or web address].

## 2024-04-02 DIAGNOSIS — G4733 Obstructive sleep apnea (adult) (pediatric): Secondary | ICD-10-CM | POA: Diagnosis not present

## 2024-04-02 DIAGNOSIS — Z760 Encounter for issue of repeat prescription: Secondary | ICD-10-CM | POA: Diagnosis not present

## 2024-04-02 DIAGNOSIS — Z79899 Other long term (current) drug therapy: Secondary | ICD-10-CM | POA: Diagnosis not present

## 2024-04-02 DIAGNOSIS — M5136 Other intervertebral disc degeneration, lumbar region with discogenic back pain only: Secondary | ICD-10-CM | POA: Diagnosis not present

## 2024-04-02 DIAGNOSIS — Z5181 Encounter for therapeutic drug level monitoring: Secondary | ICD-10-CM | POA: Diagnosis not present

## 2024-04-02 DIAGNOSIS — M7918 Myalgia, other site: Secondary | ICD-10-CM | POA: Diagnosis not present

## 2024-04-02 DIAGNOSIS — M47816 Spondylosis without myelopathy or radiculopathy, lumbar region: Secondary | ICD-10-CM | POA: Diagnosis not present

## 2024-05-05 DIAGNOSIS — Z6841 Body Mass Index (BMI) 40.0 and over, adult: Secondary | ICD-10-CM | POA: Diagnosis not present

## 2024-05-05 DIAGNOSIS — R300 Dysuria: Secondary | ICD-10-CM | POA: Diagnosis not present

## 2024-05-05 DIAGNOSIS — N3000 Acute cystitis without hematuria: Secondary | ICD-10-CM | POA: Diagnosis not present

## 2024-05-05 DIAGNOSIS — R32 Unspecified urinary incontinence: Secondary | ICD-10-CM | POA: Diagnosis not present

## 2024-05-05 DIAGNOSIS — K582 Mixed irritable bowel syndrome: Secondary | ICD-10-CM | POA: Diagnosis not present

## 2024-05-31 DIAGNOSIS — L089 Local infection of the skin and subcutaneous tissue, unspecified: Secondary | ICD-10-CM | POA: Diagnosis not present

## 2024-05-31 DIAGNOSIS — S90416A Abrasion, unspecified lesser toe(s), initial encounter: Secondary | ICD-10-CM | POA: Diagnosis not present

## 2024-05-31 DIAGNOSIS — L03031 Cellulitis of right toe: Secondary | ICD-10-CM | POA: Diagnosis not present

## 2024-06-07 DIAGNOSIS — M79674 Pain in right toe(s): Secondary | ICD-10-CM | POA: Diagnosis not present

## 2024-06-14 DIAGNOSIS — M79674 Pain in right toe(s): Secondary | ICD-10-CM | POA: Diagnosis not present

## 2024-06-18 DIAGNOSIS — M109 Gout, unspecified: Secondary | ICD-10-CM | POA: Diagnosis not present

## 2024-06-24 DIAGNOSIS — G4733 Obstructive sleep apnea (adult) (pediatric): Secondary | ICD-10-CM | POA: Diagnosis not present

## 2024-07-19 DIAGNOSIS — E7800 Pure hypercholesterolemia, unspecified: Secondary | ICD-10-CM | POA: Diagnosis not present

## 2024-07-19 DIAGNOSIS — M159 Polyosteoarthritis, unspecified: Secondary | ICD-10-CM | POA: Diagnosis not present

## 2024-07-19 DIAGNOSIS — Z6841 Body Mass Index (BMI) 40.0 and over, adult: Secondary | ICD-10-CM | POA: Diagnosis not present

## 2024-07-19 DIAGNOSIS — E039 Hypothyroidism, unspecified: Secondary | ICD-10-CM | POA: Diagnosis not present

## 2024-07-19 DIAGNOSIS — M109 Gout, unspecified: Secondary | ICD-10-CM | POA: Diagnosis not present

## 2024-07-19 DIAGNOSIS — I10 Essential (primary) hypertension: Secondary | ICD-10-CM | POA: Diagnosis not present

## 2024-07-19 DIAGNOSIS — R5382 Chronic fatigue, unspecified: Secondary | ICD-10-CM | POA: Diagnosis not present

## 2024-07-19 DIAGNOSIS — J309 Allergic rhinitis, unspecified: Secondary | ICD-10-CM | POA: Diagnosis not present

## 2024-07-19 DIAGNOSIS — K219 Gastro-esophageal reflux disease without esophagitis: Secondary | ICD-10-CM | POA: Diagnosis not present

## 2024-07-23 DIAGNOSIS — M159 Polyosteoarthritis, unspecified: Secondary | ICD-10-CM | POA: Diagnosis not present

## 2024-07-23 DIAGNOSIS — A0472 Enterocolitis due to Clostridium difficile, not specified as recurrent: Secondary | ICD-10-CM | POA: Diagnosis not present

## 2024-07-23 DIAGNOSIS — Z6841 Body Mass Index (BMI) 40.0 and over, adult: Secondary | ICD-10-CM | POA: Diagnosis not present

## 2024-07-23 DIAGNOSIS — I10 Essential (primary) hypertension: Secondary | ICD-10-CM | POA: Diagnosis not present

## 2024-07-23 DIAGNOSIS — J309 Allergic rhinitis, unspecified: Secondary | ICD-10-CM | POA: Diagnosis not present

## 2024-07-23 DIAGNOSIS — E7800 Pure hypercholesterolemia, unspecified: Secondary | ICD-10-CM | POA: Diagnosis not present

## 2024-07-23 DIAGNOSIS — M109 Gout, unspecified: Secondary | ICD-10-CM | POA: Diagnosis not present

## 2024-07-23 DIAGNOSIS — E039 Hypothyroidism, unspecified: Secondary | ICD-10-CM | POA: Diagnosis not present

## 2024-07-23 DIAGNOSIS — K219 Gastro-esophageal reflux disease without esophagitis: Secondary | ICD-10-CM | POA: Diagnosis not present

## 2024-08-03 DIAGNOSIS — J309 Allergic rhinitis, unspecified: Secondary | ICD-10-CM | POA: Diagnosis not present

## 2024-08-03 DIAGNOSIS — N1831 Chronic kidney disease, stage 3a: Secondary | ICD-10-CM | POA: Diagnosis not present

## 2024-08-03 DIAGNOSIS — E7800 Pure hypercholesterolemia, unspecified: Secondary | ICD-10-CM | POA: Diagnosis not present

## 2024-08-03 DIAGNOSIS — M159 Polyosteoarthritis, unspecified: Secondary | ICD-10-CM | POA: Diagnosis not present

## 2024-08-03 DIAGNOSIS — K219 Gastro-esophageal reflux disease without esophagitis: Secondary | ICD-10-CM | POA: Diagnosis not present

## 2024-08-03 DIAGNOSIS — Z6841 Body Mass Index (BMI) 40.0 and over, adult: Secondary | ICD-10-CM | POA: Diagnosis not present

## 2024-08-03 DIAGNOSIS — I10 Essential (primary) hypertension: Secondary | ICD-10-CM | POA: Diagnosis not present

## 2024-08-03 DIAGNOSIS — E039 Hypothyroidism, unspecified: Secondary | ICD-10-CM | POA: Diagnosis not present

## 2024-08-03 DIAGNOSIS — M109 Gout, unspecified: Secondary | ICD-10-CM | POA: Diagnosis not present

## 2024-08-10 ENCOUNTER — Other Ambulatory Visit: Payer: Self-pay | Admitting: Cardiovascular Disease

## 2024-08-17 DIAGNOSIS — E7800 Pure hypercholesterolemia, unspecified: Secondary | ICD-10-CM | POA: Diagnosis not present

## 2024-08-17 DIAGNOSIS — Z6841 Body Mass Index (BMI) 40.0 and over, adult: Secondary | ICD-10-CM | POA: Diagnosis not present

## 2024-08-17 DIAGNOSIS — J309 Allergic rhinitis, unspecified: Secondary | ICD-10-CM | POA: Diagnosis not present

## 2024-08-17 DIAGNOSIS — R197 Diarrhea, unspecified: Secondary | ICD-10-CM | POA: Diagnosis not present

## 2024-08-17 DIAGNOSIS — E039 Hypothyroidism, unspecified: Secondary | ICD-10-CM | POA: Diagnosis not present

## 2024-08-17 DIAGNOSIS — M159 Polyosteoarthritis, unspecified: Secondary | ICD-10-CM | POA: Diagnosis not present

## 2024-08-17 DIAGNOSIS — M109 Gout, unspecified: Secondary | ICD-10-CM | POA: Diagnosis not present

## 2024-08-17 DIAGNOSIS — I10 Essential (primary) hypertension: Secondary | ICD-10-CM | POA: Diagnosis not present

## 2024-08-17 DIAGNOSIS — N1831 Chronic kidney disease, stage 3a: Secondary | ICD-10-CM | POA: Diagnosis not present

## 2024-08-17 DIAGNOSIS — K219 Gastro-esophageal reflux disease without esophagitis: Secondary | ICD-10-CM | POA: Diagnosis not present

## 2024-09-06 DIAGNOSIS — R197 Diarrhea, unspecified: Secondary | ICD-10-CM | POA: Diagnosis not present

## 2024-09-06 DIAGNOSIS — M109 Gout, unspecified: Secondary | ICD-10-CM | POA: Diagnosis not present

## 2024-09-06 DIAGNOSIS — Z23 Encounter for immunization: Secondary | ICD-10-CM | POA: Diagnosis not present

## 2024-09-06 DIAGNOSIS — N1831 Chronic kidney disease, stage 3a: Secondary | ICD-10-CM | POA: Diagnosis not present

## 2024-09-06 DIAGNOSIS — Z6841 Body Mass Index (BMI) 40.0 and over, adult: Secondary | ICD-10-CM | POA: Diagnosis not present

## 2024-09-06 DIAGNOSIS — N390 Urinary tract infection, site not specified: Secondary | ICD-10-CM | POA: Diagnosis not present

## 2024-09-06 DIAGNOSIS — J309 Allergic rhinitis, unspecified: Secondary | ICD-10-CM | POA: Diagnosis not present

## 2024-09-06 DIAGNOSIS — E7800 Pure hypercholesterolemia, unspecified: Secondary | ICD-10-CM | POA: Diagnosis not present

## 2024-09-06 DIAGNOSIS — K219 Gastro-esophageal reflux disease without esophagitis: Secondary | ICD-10-CM | POA: Diagnosis not present

## 2024-09-06 DIAGNOSIS — M159 Polyosteoarthritis, unspecified: Secondary | ICD-10-CM | POA: Diagnosis not present

## 2024-09-06 DIAGNOSIS — I10 Essential (primary) hypertension: Secondary | ICD-10-CM | POA: Diagnosis not present

## 2024-09-14 DIAGNOSIS — M159 Polyosteoarthritis, unspecified: Secondary | ICD-10-CM | POA: Diagnosis not present

## 2024-09-14 DIAGNOSIS — I10 Essential (primary) hypertension: Secondary | ICD-10-CM | POA: Diagnosis not present

## 2024-09-14 DIAGNOSIS — K219 Gastro-esophageal reflux disease without esophagitis: Secondary | ICD-10-CM | POA: Diagnosis not present

## 2024-09-14 DIAGNOSIS — R197 Diarrhea, unspecified: Secondary | ICD-10-CM | POA: Diagnosis not present

## 2024-09-14 DIAGNOSIS — E7800 Pure hypercholesterolemia, unspecified: Secondary | ICD-10-CM | POA: Diagnosis not present

## 2024-09-14 DIAGNOSIS — R32 Unspecified urinary incontinence: Secondary | ICD-10-CM | POA: Diagnosis not present

## 2024-09-14 DIAGNOSIS — N1831 Chronic kidney disease, stage 3a: Secondary | ICD-10-CM | POA: Diagnosis not present

## 2024-09-14 DIAGNOSIS — R030 Elevated blood-pressure reading, without diagnosis of hypertension: Secondary | ICD-10-CM | POA: Diagnosis not present

## 2024-09-14 DIAGNOSIS — J309 Allergic rhinitis, unspecified: Secondary | ICD-10-CM | POA: Diagnosis not present

## 2024-09-14 DIAGNOSIS — M109 Gout, unspecified: Secondary | ICD-10-CM | POA: Diagnosis not present

## 2024-09-14 DIAGNOSIS — E039 Hypothyroidism, unspecified: Secondary | ICD-10-CM | POA: Diagnosis not present

## 2024-09-14 DIAGNOSIS — Z6841 Body Mass Index (BMI) 40.0 and over, adult: Secondary | ICD-10-CM | POA: Diagnosis not present

## 2024-09-28 DIAGNOSIS — N390 Urinary tract infection, site not specified: Secondary | ICD-10-CM | POA: Diagnosis not present

## 2024-09-28 DIAGNOSIS — Z6841 Body Mass Index (BMI) 40.0 and over, adult: Secondary | ICD-10-CM | POA: Diagnosis not present

## 2024-09-28 DIAGNOSIS — N1831 Chronic kidney disease, stage 3a: Secondary | ICD-10-CM | POA: Diagnosis not present

## 2024-09-28 DIAGNOSIS — E7800 Pure hypercholesterolemia, unspecified: Secondary | ICD-10-CM | POA: Diagnosis not present

## 2024-09-28 DIAGNOSIS — R197 Diarrhea, unspecified: Secondary | ICD-10-CM | POA: Diagnosis not present

## 2024-09-28 DIAGNOSIS — E039 Hypothyroidism, unspecified: Secondary | ICD-10-CM | POA: Diagnosis not present

## 2024-09-28 DIAGNOSIS — K219 Gastro-esophageal reflux disease without esophagitis: Secondary | ICD-10-CM | POA: Diagnosis not present

## 2024-09-28 DIAGNOSIS — M109 Gout, unspecified: Secondary | ICD-10-CM | POA: Diagnosis not present

## 2024-09-28 DIAGNOSIS — R32 Unspecified urinary incontinence: Secondary | ICD-10-CM | POA: Diagnosis not present

## 2024-09-28 DIAGNOSIS — J309 Allergic rhinitis, unspecified: Secondary | ICD-10-CM | POA: Diagnosis not present

## 2024-09-28 DIAGNOSIS — I10 Essential (primary) hypertension: Secondary | ICD-10-CM | POA: Diagnosis not present

## 2024-09-28 DIAGNOSIS — M159 Polyosteoarthritis, unspecified: Secondary | ICD-10-CM | POA: Diagnosis not present

## 2024-10-06 DIAGNOSIS — I10 Essential (primary) hypertension: Secondary | ICD-10-CM | POA: Insufficient documentation

## 2024-10-06 DIAGNOSIS — E785 Hyperlipidemia, unspecified: Secondary | ICD-10-CM | POA: Insufficient documentation

## 2024-10-06 DIAGNOSIS — I471 Supraventricular tachycardia, unspecified: Secondary | ICD-10-CM | POA: Insufficient documentation

## 2024-10-06 DIAGNOSIS — I5189 Other ill-defined heart diseases: Secondary | ICD-10-CM | POA: Insufficient documentation

## 2024-10-07 ENCOUNTER — Ambulatory Visit: Attending: Cardiology | Admitting: Cardiology

## 2024-10-07 ENCOUNTER — Encounter: Payer: Self-pay | Admitting: Cardiology

## 2024-10-07 VITALS — BP 160/88 | HR 80 | Ht 66.0 in | Wt 268.4 lb

## 2024-10-07 DIAGNOSIS — I1A0 Resistant hypertension: Secondary | ICD-10-CM | POA: Diagnosis not present

## 2024-10-07 DIAGNOSIS — I471 Supraventricular tachycardia, unspecified: Secondary | ICD-10-CM | POA: Diagnosis not present

## 2024-10-07 DIAGNOSIS — G4733 Obstructive sleep apnea (adult) (pediatric): Secondary | ICD-10-CM

## 2024-10-07 NOTE — Patient Instructions (Signed)

## 2024-10-07 NOTE — Progress Notes (Signed)
 Cardiology Office Note:    Date:  10/07/2024   ID:  Peggy Jennings, DOB 1946-03-22, MRN [id number]  PCP:  Clemmie Nest, MD  Cardiologist:  Redell Leiter, MD    Referring MD: Clemmie Nest, MD    ASSESSMENT:    1. Resistant hypertension   2. Paroxysmal SVT (supraventricular tachycardia)   3. OSA (obstructive sleep apnea)    PLAN:    In order of problems listed above:  For part problems been resistant hypertension.  Fortunately with intensive medical therapy treatment sleep apnea she has achieved a good clinical endpoint.  I would not change her current medications and continue to call follow closely and trend at home she takes carvedilol  hydrochlorothiazide  telmisartan and spironolactone  No recurrence of her SVT Compliant with treatment certainly has helped control hypertension She has point localized mild costochondral chest pain I think for now reassurance and Tylenol  is appropriate   Next appointment: 1 year   Medication Adjustments/Labs and Tests Ordered: Current medicines are reviewed at length with the patient today.  Concerns regarding medicines are outlined above.  No orders of the defined types were placed in this encounter.  No orders of the defined types were placed in this encounter.    History of Present Illness:    Peggy Jennings is a 78 y.o. female with a hx of hypertension SVT sleep apnea obesity and hyperlipidemia last seen 03/25/2024. Compliance with diet, lifestyle and medications: Yes  She uses a validated device [employer] good technique trends her blood pressures at home tend to be variable the majority of her readings are in the 130/70 range she has low numbers intermittently as low was 91/56 and high numbers intermittently as high as 163/96. She is absolutely compliant with CPAP uses it all the time when she sleeps Is quite limited activities because of diffuse back and lower extremity large joint arthritis uses a cane She is unable to lose  weight She had an episode of gout recently treated with her PCP She has intermittent chest discomfort nonexertional sharp point localized and is tender over this ECG consistent with chronic costochondral pain is not anginal in nature She has no edema or shortness of breath. Recent laboratory studies 08/03/2024 cholesterol 171 LDL 99 uric acid high normal 7.4 CBC hemoglobin 11.8 CMP creatinine 1.11 GFR 51 cc/min stage IIIa CKD sodium 139 potassium 4.6 Past Medical History:  Diagnosis Date   Acquired hallux rigidus, left 09/25/2021   Allergic rhinitis 02/21/2016   Arthritis 02/21/2016   Asthma 02/21/2016   Cervical radiculopathy 11/03/2018   Cystocele 12/19/2015   Depression    Diastolic dysfunction    a. 05/2019 Echo: EF 55-60%, sev conc LVH, no rwma, nl RV fxn, mild AI; 11/2022 Echo: EF 55-60%, no rwma, mod conc LVH, GrI DD, nl RV fxn, triv MR, mild AI.   Dysrhythmia 08/14/2022   Hx of atrial fib. See progress note.      Dysuria 08/04/2017   Edema of both lower extremities 07/06/2018   Fibromyalgia    Fissure in skin of both feet 09/25/2021   Fracture of bone adjacent to prosthesis 02/07/2012   GERD (gastroesophageal reflux disease)    History of anemia    History of bronchitis    HTN (hypertension) 02/07/2012   a. 02/2023 Renal duplex: No RAS.   Hyperlipemia    Hyperlipidemia 02/10/2012   Hypertension    Hypothyroid 02/21/2016   Intermittent constipation 02/21/2016   Knee pain 01/21/2013   Lesion of bladder 12/19/2015  Lumbar spondylosis 12/09/2019   Obesity 07/02/2016   Paroxysmal SVT (supraventricular tachycardia) 08/21/2016   Periprosthetic fracture around internal prosthetic right knee joint 02/07/2012   Postoperative follow-up 01/15/2019   PSVT (paroxysmal supraventricular tachycardia)    a. 2007 s/p RFCA.   Recurrent upper respiratory infection (URI)    Right knee pain 01/03/2014   Sacroiliac joint pain 11/16/2020   Shortness of breath    SOB (shortness of breath)  on exertion 07/06/2018   Status post revision of total knee replacement 02/07/2012   Toenail deformity 09/25/2021   Transitional vertebra 10/04/2021   Trochanteric bursitis of left hip 06/11/2018   Urinary incontinence 12/19/2015   Urinary tract infection 2 months ago     Current Medications: Current Meds  Medication Sig   albuterol  (PROAIR  HFA) 108 (90 Base) MCG/ACT inhaler Inhale 1 puff into the lungs every 6 (six) hours as needed.    allopurinol (ZYLOPRIM) 100 MG tablet Take 100 mg by mouth daily.   baclofen (LIORESAL) 10 MG tablet Take 10 mg by mouth daily.   buPROPion (WELLBUTRIN XL) 300 MG 24 hr tablet Take 300 mg by mouth daily.   CALCIUM  PO Take 1 tablet by mouth daily.    carvedilol  (COREG ) 6.25 MG tablet Take 6.25 mg by mouth 2 (two) times daily.   Cholecalciferol  (VITAMIN D3) 400 units CAPS Take 400 Units by mouth daily.   ciclopirox  (PENLAC ) 8 % solution Apply topically at bedtime. Apply over nail and surrounding skin. Apply daily over previous coat. After seven (7) days, may remove with alcohol and continue cycle.   cyanocobalamin 100 MCG tablet Take 100 mcg by mouth daily.   diphenoxylate-atropine (LOMOTIL) 2.5-0.025 MG tablet Take 1 tablet by mouth 4 (four) times daily as needed.   hydrochlorothiazide  (HYDRODIURIL ) 25 MG tablet TAKE 1 TABLET BY MOUTH DAILY **STOP THE FUROSEMIDE **   levocetirizine (XYZAL) 5 MG tablet Take 5 mg by mouth daily.   levothyroxine (SYNTHROID) 112 MCG tablet Take 112 mcg by mouth daily before breakfast.   meloxicam  (MOBIC ) 15 MG tablet Take 15 mg by mouth daily.   montelukast (SINGULAIR) 10 MG tablet Take 10 mg by mouth at bedtime.   Multiple Vitamin (MULTI-VITAMINS) TABS Take 1 tablet by mouth daily.    MYRBETRIQ 50 MG TB24 tablet Take 50 mg by mouth daily.   pantoprazole  (PROTONIX ) 40 MG tablet Take 40 mg by mouth 2 (two) times daily.   spironolactone  (ALDACTONE ) 25 MG tablet TAKE ONE TABLET BY MOUTH EVERY DAY   telmisartan (MICARDIS) 80 MG  tablet Take 80 mg by mouth daily.    traZODone (DESYREL) 50 MG tablet Take 50 mg by mouth at bedtime.      EKGs/Labs/Other Studies Reviewed:    The following studies were reviewed today:  Cardiac Studies & Procedures   ______________________________________________________________________________________________     ECHOCARDIOGRAM  ECHOCARDIOGRAM COMPLETE 12/23/2022  Narrative ECHOCARDIOGRAM REPORT    Patient Name:   Peggy Jennings Date of Exam: 12/23/2022 Medical Rec #:  [id number]        Height:       66.0 in Accession #:    [id number]       Weight:       263.0 lb Date of Birth:  04-Sep-1946        BSA:          2.247 m Patient Age:    78 years         BP:  151/82 mmHg Patient Gender: F                HR:           56 bpm. Exam Location:  [place]  Procedure: 2D Echo, Cardiac Doppler, Color Doppler and Strain Analysis  Indications:    SOB (shortness of breath) on exertion [R06.02 (ICD-10-CM)]  History:        Patient has prior history of Echocardiogram examinations, most recent 06/21/2019. Arrythmias:Paroxysmal SVT (supraventricular tachycardia); Risk Factors:Hypertension.  Sonographer:    Lynwood Silvas RDCS Referring Phys: [id number] Jakevion Arney J Eidan Muellner  IMPRESSIONS   1. Left ventricular ejection fraction, by estimation, is 55 to 60%. The left ventricle has normal function. The left ventricle has no regional wall motion abnormalities. There is moderate concentric left ventricular hypertrophy. Left ventricular diastolic parameters are consistent with Grade I diastolic dysfunction (impaired relaxation). Elevated left ventricular end-diastolic pressure. 2. Right ventricular systolic function is normal. The right ventricular size is normal. There is normal pulmonary artery systolic pressure. 3. The mitral valve is normal in structure. Trivial mitral valve regurgitation. No evidence of mitral stenosis. 4. The aortic valve is normal in structure. Aortic valve regurgitation is  mild. Aortic valve sclerosis is present, with no evidence of aortic valve stenosis. 5. Aortic Normal DTA. 6. The inferior vena cava is dilated in size with >50% respiratory variability, suggesting right atrial pressure of 8 mmHg.  FINDINGS Left Ventricle: Left ventricular ejection fraction, by estimation, is 55 to 60%. The left ventricle has normal function. The left ventricle has no regional wall motion abnormalities. Global longitudinal strain performed but not reported based on interpreter judgement due to suboptimal tracking. The left ventricular internal cavity size was normal in size. There is moderate concentric left ventricular hypertrophy. Left ventricular diastolic parameters are consistent with Grade I diastolic dysfunction (impaired relaxation). Elevated left ventricular end-diastolic pressure.  Right Ventricle: The right ventricular size is normal. No increase in right ventricular wall thickness. Right ventricular systolic function is normal. There is normal pulmonary artery systolic pressure. The tricuspid regurgitant velocity is 1.59 m/s, and with an assumed right atrial pressure of 8 mmHg, the estimated right ventricular systolic pressure is 18.1 mmHg.  Left Atrium: Left atrial size was normal in size.  Right Atrium: Right atrial size was normal in size.  Pericardium: There is no evidence of pericardial effusion.  Mitral Valve: The mitral valve is normal in structure. Trivial mitral valve regurgitation. No evidence of mitral valve stenosis.  Tricuspid Valve: The tricuspid valve is normal in structure. Tricuspid valve regurgitation is mild . No evidence of tricuspid stenosis.  Aortic Valve: The aortic valve is normal in structure. Aortic valve regurgitation is mild. Aortic regurgitation PHT measures 599 msec. Aortic valve sclerosis is present, with no evidence of aortic valve stenosis.  Pulmonic Valve: The pulmonic valve was normal in structure. Pulmonic valve regurgitation is  not visualized. No evidence of pulmonic stenosis.  Aorta: The aortic arch was not well visualized, the aortic root and ascending aorta are structurally normal, with no evidence of dilitation and Normal DTA.  Venous: The inferior vena cava is dilated in size with greater than 50% respiratory variability, suggesting right atrial pressure of 8 mmHg.  IAS/Shunts: No atrial level shunt detected by color flow Doppler.   LEFT VENTRICLE PLAX 2D LVIDd:         4.60 cm   Diastology LVIDs:         3.40 cm   LV e' medial:  4.06 cm/s LV PW:         1.40 cm   LV E/e' medial:  21.9 LV IVS:        1.40 cm   LV e' lateral:   4.00 cm/s LVOT diam:     2.10 cm   LV E/e' lateral: 22.3 LV SV:         96 LV SV Index:   43 LVOT Area:     3.46 cm   RIGHT VENTRICLE             IVC RV S prime:     10.70 cm/s  IVC diam: 2.10 cm TAPSE (M-mode): 2.6 cm  LEFT ATRIUM             Index        RIGHT ATRIUM           Index LA diam:        3.70 cm 1.65 cm/m   RA Area:     17.30 cm LA Vol (A2C):   78.5 ml 34.94 ml/m  RA Volume:   46.20 ml  20.56 ml/m LA Vol (A4C):   63.0 ml 28.04 ml/m LA Biplane Vol: 70.7 ml 31.47 ml/m AORTIC VALVE LVOT Vmax:   96.05 cm/s LVOT Vmean:  68.100 cm/s LVOT VTI:    0.278 m AI PHT:      599 msec  AORTA Ao Root diam: 3.20 cm Ao Asc diam:  3.50 cm Ao Desc diam: 2.50 cm  MITRAL VALVE               TRICUSPID VALVE MV Area (PHT): 3.77 cm    TR Peak grad:   10.1 mmHg MV Decel Time: 201 msec    TR Vmax:        159.00 cm/s MV E velocity: 89.10 cm/s MV A velocity: 96.00 cm/s  SHUNTS MV E/A ratio:  0.93        Systemic VTI:  0.28 m Systemic Diam: 2.10 cm  Redell Leiter MD Electronically signed by Redell Leiter MD Signature Date/Time: 12/23/2022/5:09:36 PM    Final          ______________________________________________________________________________________________          Recent Labs: No results found for requested labs within last 365 days.  Recent Lipid  Panel    Component Value Date/Time   CHOL 259 (H) 10/06/2017 1002   TRIG 232 (H) 10/06/2017 1002   HDL 51 10/06/2017 1002   CHOLHDL 5.1 (H) 10/06/2017 1002   LDLCALC 162 (H) 10/06/2017 1002    Physical Exam:    VS:  BP (!) 150/88   Pulse 80   Ht 5' 6 (1.676 m)   Wt 268 lb 6.4 oz (121.7 kg)   SpO2 98%   BMI 43.32 kg/m     Wt Readings from Last 3 Encounters:  10/07/24 268 lb 6.4 oz (121.7 kg)  03/25/24 269 lb (122 kg)  11/28/23 266 lb 3.2 oz (120.7 kg)     GEN:  Well nourished, well developed in no acute distress HEENT: Normal NECK: No JVD; No carotid bruits LYMPHATICS: No lymphadenopathy CARDIAC: RRR, no murmurs, rubs, gallops RESPIRATORY:  Clear to auscultation without rales, wheezing or rhonchi  ABDOMEN: Soft, non-tender, non-distended MUSCULOSKELETAL:  No edema; No deformity  SKIN: Warm and dry NEUROLOGIC:  Alert and oriented x 3 PSYCHIATRIC:  Normal affect    Signed, Redell Leiter, MD  10/07/2024 3:26 PM    [facility]

## 2024-10-15 DIAGNOSIS — Z1330 Encounter for screening examination for mental health and behavioral disorders, unspecified: Secondary | ICD-10-CM | POA: Diagnosis not present

## 2024-10-15 DIAGNOSIS — N390 Urinary tract infection, site not specified: Secondary | ICD-10-CM | POA: Diagnosis not present

## 2024-10-15 DIAGNOSIS — N3941 Urge incontinence: Secondary | ICD-10-CM | POA: Diagnosis not present

## 2024-10-19 DIAGNOSIS — Z6841 Body Mass Index (BMI) 40.0 and over, adult: Secondary | ICD-10-CM | POA: Diagnosis not present

## 2024-10-19 DIAGNOSIS — E669 Obesity, unspecified: Secondary | ICD-10-CM | POA: Diagnosis not present

## 2024-10-19 DIAGNOSIS — Z0000 Encounter for general adult medical examination without abnormal findings: Secondary | ICD-10-CM | POA: Diagnosis not present

## 2024-10-19 DIAGNOSIS — Z9181 History of falling: Secondary | ICD-10-CM | POA: Diagnosis not present

## 2024-10-19 DIAGNOSIS — J309 Allergic rhinitis, unspecified: Secondary | ICD-10-CM | POA: Diagnosis not present

## 2024-10-19 DIAGNOSIS — M159 Polyosteoarthritis, unspecified: Secondary | ICD-10-CM | POA: Diagnosis not present

## 2024-10-19 DIAGNOSIS — Z1331 Encounter for screening for depression: Secondary | ICD-10-CM | POA: Diagnosis not present

## 2024-10-19 DIAGNOSIS — I10 Essential (primary) hypertension: Secondary | ICD-10-CM | POA: Diagnosis not present

## 2024-10-19 DIAGNOSIS — R197 Diarrhea, unspecified: Secondary | ICD-10-CM | POA: Diagnosis not present

## 2024-10-19 DIAGNOSIS — M109 Gout, unspecified: Secondary | ICD-10-CM | POA: Diagnosis not present

## 2024-10-19 DIAGNOSIS — R32 Unspecified urinary incontinence: Secondary | ICD-10-CM | POA: Diagnosis not present

## 2024-10-19 DIAGNOSIS — E039 Hypothyroidism, unspecified: Secondary | ICD-10-CM | POA: Diagnosis not present

## 2024-11-02 DIAGNOSIS — R32 Unspecified urinary incontinence: Secondary | ICD-10-CM | POA: Diagnosis not present

## 2024-11-02 DIAGNOSIS — M109 Gout, unspecified: Secondary | ICD-10-CM | POA: Diagnosis not present

## 2024-11-02 DIAGNOSIS — E039 Hypothyroidism, unspecified: Secondary | ICD-10-CM | POA: Diagnosis not present

## 2024-11-02 DIAGNOSIS — R197 Diarrhea, unspecified: Secondary | ICD-10-CM | POA: Diagnosis not present

## 2024-11-02 DIAGNOSIS — M159 Polyosteoarthritis, unspecified: Secondary | ICD-10-CM | POA: Diagnosis not present

## 2024-11-02 DIAGNOSIS — N1831 Chronic kidney disease, stage 3a: Secondary | ICD-10-CM | POA: Diagnosis not present

## 2024-11-02 DIAGNOSIS — I10 Essential (primary) hypertension: Secondary | ICD-10-CM | POA: Diagnosis not present

## 2024-11-02 DIAGNOSIS — J309 Allergic rhinitis, unspecified: Secondary | ICD-10-CM | POA: Diagnosis not present

## 2024-11-02 DIAGNOSIS — E7800 Pure hypercholesterolemia, unspecified: Secondary | ICD-10-CM | POA: Diagnosis not present

## 2024-11-02 DIAGNOSIS — Z6841 Body Mass Index (BMI) 40.0 and over, adult: Secondary | ICD-10-CM | POA: Diagnosis not present

## 2024-11-02 DIAGNOSIS — K219 Gastro-esophageal reflux disease without esophagitis: Secondary | ICD-10-CM | POA: Diagnosis not present

## 2024-11-02 DIAGNOSIS — E669 Obesity, unspecified: Secondary | ICD-10-CM | POA: Diagnosis not present
